# Patient Record
Sex: Female | Born: 1945 | Race: White | Hispanic: No | Smoking: Current some day smoker
Health system: Southern US, Community
[De-identification: ages and names within clinical notes are randomized; demographics above are authoritative.]

## PROBLEM LIST (undated history)

## (undated) DIAGNOSIS — E785 Hyperlipidemia, unspecified: Secondary | ICD-10-CM

## (undated) DIAGNOSIS — F419 Anxiety disorder, unspecified: Secondary | ICD-10-CM

## (undated) DIAGNOSIS — Z79899 Other long term (current) drug therapy: Secondary | ICD-10-CM

## (undated) DIAGNOSIS — I951 Orthostatic hypotension: Secondary | ICD-10-CM

## (undated) DIAGNOSIS — I358 Other nonrheumatic aortic valve disorders: Secondary | ICD-10-CM

## (undated) DIAGNOSIS — J449 Chronic obstructive pulmonary disease, unspecified: Secondary | ICD-10-CM

## (undated) DIAGNOSIS — R1013 Epigastric pain: Secondary | ICD-10-CM

## (undated) DIAGNOSIS — R112 Nausea with vomiting, unspecified: Secondary | ICD-10-CM

## (undated) DIAGNOSIS — F39 Unspecified mood [affective] disorder: Secondary | ICD-10-CM

## (undated) DIAGNOSIS — I10 Essential (primary) hypertension: Secondary | ICD-10-CM

## (undated) DIAGNOSIS — R0602 Shortness of breath: Secondary | ICD-10-CM

## (undated) DIAGNOSIS — C801 Malignant (primary) neoplasm, unspecified: Secondary | ICD-10-CM

## (undated) DIAGNOSIS — I499 Cardiac arrhythmia, unspecified: Secondary | ICD-10-CM

## (undated) DIAGNOSIS — R2681 Unsteadiness on feet: Secondary | ICD-10-CM

## (undated) DIAGNOSIS — M1990 Unspecified osteoarthritis, unspecified site: Secondary | ICD-10-CM

## (undated) DIAGNOSIS — I959 Hypotension, unspecified: Secondary | ICD-10-CM

## (undated) HISTORY — PX: HAND SURGERY: SHX662

## (undated) HISTORY — DX: Nausea with vomiting, unspecified: R11.2

## (undated) HISTORY — PX: APPENDECTOMY: SHX54

## (undated) HISTORY — PX: CHOLECYSTECTOMY: SHX55

## (undated) HISTORY — DX: Other nonrheumatic aortic valve disorders: I35.8

## (undated) HISTORY — PX: REPLACEMENT TOTAL KNEE: SUR1224

## (undated) HISTORY — DX: Other long term (current) drug therapy: Z79.899

## (undated) HISTORY — DX: Hypotension, unspecified: I95.9

## (undated) HISTORY — PX: COLON SURGERY: SHX602

## (undated) HISTORY — PX: BACK SURGERY: SHX140

## (undated) HISTORY — DX: Unspecified mood (affective) disorder: F39

## (undated) HISTORY — DX: Hyperlipidemia, unspecified: E78.5

## (undated) HISTORY — DX: Orthostatic hypotension: I95.1

## (undated) HISTORY — DX: Unsteadiness on feet: R26.81

## (undated) HISTORY — DX: Chronic obstructive pulmonary disease, unspecified: J44.9

## (undated) HISTORY — DX: Epigastric pain: R10.13

## (undated) HISTORY — DX: Essential (primary) hypertension: I10

---

## 2003-01-03 ENCOUNTER — Inpatient Hospital Stay (HOSPITAL_COMMUNITY): Admission: EM | Admit: 2003-01-03 | Discharge: 2003-01-04 | Payer: Self-pay

## 2011-12-07 ENCOUNTER — Other Ambulatory Visit: Payer: Self-pay | Admitting: Gastroenterology

## 2011-12-07 DIAGNOSIS — R197 Diarrhea, unspecified: Secondary | ICD-10-CM

## 2011-12-07 DIAGNOSIS — E164 Increased secretion of gastrin: Secondary | ICD-10-CM

## 2011-12-09 ENCOUNTER — Ambulatory Visit
Admission: RE | Admit: 2011-12-09 | Discharge: 2011-12-09 | Disposition: A | Discharge status: Home / Self Care | Payer: Medicare Other | Source: Ambulatory Visit | Attending: Gastroenterology | Admitting: Gastroenterology

## 2011-12-09 ENCOUNTER — Other Ambulatory Visit: Payer: Self-pay

## 2011-12-09 DIAGNOSIS — R197 Diarrhea, unspecified: Secondary | ICD-10-CM

## 2011-12-09 DIAGNOSIS — E164 Increased secretion of gastrin: Secondary | ICD-10-CM

## 2011-12-10 ENCOUNTER — Ambulatory Visit
Admission: RE | Admit: 2011-12-10 | Discharge: 2011-12-10 | Disposition: A | Discharge status: Home / Self Care | Payer: Medicare Other | Source: Ambulatory Visit | Attending: Gastroenterology | Admitting: Gastroenterology

## 2011-12-10 MED ORDER — GADOBENATE DIMEGLUMINE 529 MG/ML IV SOLN
11.0000 mL | Freq: Once | INTRAVENOUS | Status: AC | PRN
  Administered 2011-12-10: 11 mL via INTRAVENOUS

## 2012-01-12 ENCOUNTER — Inpatient Hospital Stay (HOSPITAL_COMMUNITY)
Admission: RE | Admit: 2012-01-12 | Discharge: 2012-01-12 | DRG: 392 | Disposition: A | Discharge status: Other Acute Inpatient Hospital | Payer: Medicare Other | Source: Ambulatory Visit | Attending: Gastroenterology | Admitting: Gastroenterology

## 2012-01-12 ENCOUNTER — Ambulatory Visit (HOSPITAL_COMMUNITY): Payer: Medicare Other | Admitting: Anesthesiology

## 2012-01-12 ENCOUNTER — Encounter (HOSPITAL_COMMUNITY): Payer: Self-pay

## 2012-01-12 ENCOUNTER — Ambulatory Visit (HOSPITAL_COMMUNITY)
Admission: RE | Disposition: A | Discharge status: Other Acute Inpatient Hospital | Payer: Medicare Other | Source: Ambulatory Visit | Attending: Gastroenterology

## 2012-01-12 ENCOUNTER — Encounter (HOSPITAL_COMMUNITY): Payer: Self-pay | Admitting: Anesthesiology

## 2012-01-12 ENCOUNTER — Ambulatory Visit (HOSPITAL_COMMUNITY): Payer: Medicare Other

## 2012-01-12 DIAGNOSIS — E119 Type 2 diabetes mellitus without complications: Secondary | ICD-10-CM | POA: Diagnosis present

## 2012-01-12 DIAGNOSIS — R5381 Other malaise: Secondary | ICD-10-CM | POA: Diagnosis present

## 2012-01-12 DIAGNOSIS — K529 Noninfective gastroenteritis and colitis, unspecified: Secondary | ICD-10-CM

## 2012-01-12 DIAGNOSIS — R634 Abnormal weight loss: Secondary | ICD-10-CM | POA: Diagnosis present

## 2012-01-12 DIAGNOSIS — Z85038 Personal history of other malignant neoplasm of large intestine: Secondary | ICD-10-CM

## 2012-01-12 DIAGNOSIS — R197 Diarrhea, unspecified: Principal | ICD-10-CM | POA: Diagnosis present

## 2012-01-12 HISTORY — DX: Malignant (primary) neoplasm, unspecified: C80.1

## 2012-01-12 HISTORY — DX: Cardiac arrhythmia, unspecified: I49.9

## 2012-01-12 HISTORY — DX: Noninfective gastroenteritis and colitis, unspecified: K52.9

## 2012-01-12 HISTORY — DX: Shortness of breath: R06.02

## 2012-01-12 HISTORY — DX: Anxiety disorder, unspecified: F41.9

## 2012-01-12 HISTORY — PX: EUS: SHX5427

## 2012-01-12 HISTORY — DX: Unspecified osteoarthritis, unspecified site: M19.90

## 2012-01-12 LAB — CBC
HCT: 35.6 % — ABNORMAL LOW (ref 36.0–46.0)
Hemoglobin: 12.3 g/dL (ref 12.0–15.0)
MCH: 29.9 pg (ref 26.0–34.0)
MCHC: 34.6 g/dL (ref 30.0–36.0)
MCV: 86.6 fL (ref 78.0–100.0)
Platelets: 297 10*3/uL (ref 150–400)
RBC: 4.11 MIL/uL (ref 3.87–5.11)
RDW: 12.8 % (ref 11.5–15.5)
WBC: 10.5 10*3/uL (ref 4.0–10.5)

## 2012-01-12 LAB — GLUCOSE, CAPILLARY
Glucose-Capillary: 80 mg/dL (ref 70–99)
Glucose-Capillary: 95 mg/dL (ref 70–99)
Glucose-Capillary: 100 mg/dL — ABNORMAL HIGH (ref 70–99)
Glucose-Capillary: 101 mg/dL — ABNORMAL HIGH (ref 70–99)

## 2012-01-12 LAB — COMPREHENSIVE METABOLIC PANEL
ALT: 12 U/L (ref 0–35)
AST: 14 U/L (ref 0–37)
Albumin: 3.2 g/dL — ABNORMAL LOW (ref 3.5–5.2)
Alkaline Phosphatase: 57 U/L (ref 39–117)
BUN: 12 mg/dL (ref 6–23)
CO2: 22 mEq/L (ref 19–32)
Calcium: 9.1 mg/dL (ref 8.4–10.5)
Chloride: 110 mEq/L (ref 96–112)
Creatinine, Ser: 0.49 mg/dL — ABNORMAL LOW (ref 0.50–1.10)
GFR calc Af Amer: >90 mL/min (ref >90)
GFR calc non Af Amer: >90 mL/min (ref >90)
Glucose, Bld: 122 mg/dL — ABNORMAL HIGH (ref 70–99)
Potassium: 3.1 mEq/L — ABNORMAL LOW (ref 3.5–5.1)
Sodium: 140 mEq/L (ref 135–145)
Total Bilirubin: 0.4 mg/dL (ref 0.3–1.2)
Total Protein: 5.9 g/dL — ABNORMAL LOW (ref 6.0–8.3)

## 2012-01-12 LAB — DIFFERENTIAL
Basophils Absolute: 0 10*3/uL (ref 0.0–0.1)
Basophils Relative: 0 % (ref 0–1)
Eosinophils Absolute: 0.2 10*3/uL (ref 0.0–0.7)
Eosinophils Relative: 2 % (ref 0–5)
Lymphocytes Relative: 32 % (ref 12–46)
Lymphs Abs: 3.3 10*3/uL (ref 0.7–4.0)
Monocytes Absolute: 0.8 10*3/uL (ref 0.1–1.0)
Monocytes Relative: 7 % (ref 3–12)
Neutro Abs: 6.2 10*3/uL (ref 1.7–7.7)
Neutrophils Relative %: 59 % (ref 43–77)

## 2012-01-12 LAB — TSH: TSH: 2.365 u[IU]/mL (ref 0.350–4.500)

## 2012-01-12 SURGERY — UPPER ENDOSCOPIC ULTRASOUND (EUS) RADIAL
Anesthesia: Monitor Anesthesia Care

## 2012-01-12 MED ORDER — ONDANSETRON HCL 4 MG PO TABS
4.0000 mg | ORAL_TABLET | Freq: Four times a day (QID) | ORAL | Status: DC | PRN
  Filled 2012-01-12: qty 1

## 2012-01-12 MED ORDER — FENTANYL CITRATE 0.05 MG/ML IJ SOLN
INTRAMUSCULAR | Status: DC | PRN
  Administered 2012-01-12: 50 ug via INTRAVENOUS

## 2012-01-12 MED ORDER — FENTANYL CITRATE 0.05 MG/ML IJ SOLN
INTRAMUSCULAR | Status: DC | PRN
  Administered 2012-01-12: 25 ug via INTRAVENOUS

## 2012-01-12 MED ORDER — SODIUM CHLORIDE 0.9 % IV SOLN: INTRAVENOUS | Status: DC

## 2012-01-12 MED ORDER — HYDROMORPHONE HCL PF 1 MG/ML IJ SOLN
1.0000 mg | INTRAMUSCULAR | Status: DC | PRN
  Administered 2012-01-12 (×2): 1 mg via INTRAVENOUS
  Filled 2012-01-12: qty 1

## 2012-01-12 MED ORDER — ZINC OXIDE 40 % EX PSTE
1.0000 "application " | PASTE | CUTANEOUS | Status: DC | PRN
  Filled 2012-01-12: qty 1

## 2012-01-12 MED ORDER — PROPOFOL INFUSION 10 MG/ML OPTIME
INTRAVENOUS | Status: DC | PRN
  Administered 2012-01-12: 140 ug/kg/min via INTRAVENOUS

## 2012-01-12 MED ORDER — FENTANYL CITRATE 0.05 MG/ML IJ SOLN
INTRAMUSCULAR | Status: AC
  Filled 2012-01-12: qty 2

## 2012-01-12 MED ORDER — LACTATED RINGERS IV SOLN: INTRAVENOUS | Status: DC

## 2012-01-12 MED ORDER — ONDANSETRON HCL 4 MG/2ML IJ SOLN
4.0000 mg | Freq: Once | INTRAMUSCULAR | Status: AC
  Administered 2012-01-12: 4 mg via INTRAVENOUS

## 2012-01-12 MED ORDER — ONDANSETRON HCL 4 MG/2ML IJ SOLN
INTRAMUSCULAR | Status: AC
  Filled 2012-01-12: qty 2

## 2012-01-12 MED ORDER — FENTANYL CITRATE 0.05 MG/ML IJ SOLN
25.0000 ug | INTRAMUSCULAR | Status: DC | PRN
  Administered 2012-01-12 (×2): 25 ug via INTRAVENOUS

## 2012-01-12 MED ORDER — MIDAZOLAM HCL 5 MG/5ML IJ SOLN
INTRAMUSCULAR | Status: DC | PRN
  Administered 2012-01-12 (×2): 1 mg via INTRAVENOUS

## 2012-01-12 MED ORDER — HYDROMORPHONE HCL PF 1 MG/ML IJ SOLN
INTRAMUSCULAR | Status: AC
  Filled 2012-01-12: qty 1

## 2012-01-12 MED ORDER — LACTATED RINGERS IV SOLN
INTRAVENOUS | Status: DC | PRN
  Administered 2012-01-12: 08:00:00 via INTRAVENOUS

## 2012-01-12 MED ORDER — TRAMADOL HCL 50 MG PO TABS
50.0000 mg | ORAL_TABLET | Freq: Four times a day (QID) | ORAL | Status: DC | PRN
  Filled 2012-01-12: qty 1

## 2012-01-12 MED ORDER — ZINC OXIDE 40 % EX OINT
TOPICAL_OINTMENT | CUTANEOUS | Status: DC | PRN
  Filled 2012-01-12: qty 114

## 2012-01-12 MED ORDER — ONDANSETRON HCL 4 MG/2ML IJ SOLN: 4.0000 mg | Freq: Four times a day (QID) | INTRAMUSCULAR | Status: DC | PRN

## 2012-01-12 MED ORDER — SODIUM CHLORIDE 0.9 % IV SOLN
INTRAVENOUS | Status: DC
  Administered 2012-01-12: 13:00:00 via INTRAVENOUS

## 2012-01-12 NOTE — Progress Notes (Signed)
Patient transferred to [facility] via [place], report called to [place], nurse at [facility], who denied questions/concerns.  Report given to JC at [facility]. Pt stable at time of discharge.

## 2012-01-12 NOTE — H&P (Signed)
[facility] Admission Note  Chief Complaint:  diarrhea HPI: MARESHA ANASTOS is an 66 y.o. female with intractable diarrhea.  Extensive evaluation with cause of symptoms unclear.  Extensive testing and empiric medication trials unrevealing.  Past Medical History  Diagnosis Date  . Dysrhythmia   . Shortness of breath   . Diabetes mellitus   . Cancer     colon  . Arthritis   . Anxiety     Past Surgical History  Procedure Date  . Cholecystectomy   . Appendectomy   . Replacement total knee   . Colon surgery     Medications Prior to Admission  Medication Sig Dispense Refill  . hyoscyamine (ANASPAZ) 0.125 MG TBDP Place 0.125 mg under the tongue 3 (three) times daily after meals.      Marland Kitchen LORazepam (ATIVAN) 1 MG tablet Take 1 mg by mouth 1 day or 1 dose.      . mesalamine (LIALDA) 1.2 G EC tablet Take 1,200 mg by mouth 4 (four) times daily.      . ondansetron (ZOFRAN) 4 MG tablet Take 4 mg by mouth every 8 (eight) hours as needed.      . potassium chloride SA (K-DUR,KLOR-CON) 20 MEQ tablet Take 20 mEq by mouth 2 (two) times daily.      . temazepam (RESTORIL) 15 MG capsule Take 15 mg by mouth at bedtime as needed.      . traMADol (ULTRAM) 50 MG tablet Take 50 mg by mouth every 6 (six) hours as needed.      . Vitamin D, Ergocalciferol, (DRISDOL) 50000 UNITS CAPS Take 50,000 Units by mouth.      . nitroGLYCERIN (NITROSTAT) 0.4 MG SL tablet Place 0.4 mg under the tongue every 5 (five) minutes as needed.        Allergies:  Allergies  Allergen Reactions  . Codeine Hives  . Tylenol (Acetaminophen)     History reviewed. No pertinent family history.  Social History:  does not have a smoking history on file. She does not have any smokeless tobacco history on file. She reports that she does not drink alcohol or use illicit drugs.   ROS:As per HPI  Blood pressure 141/85, pulse 80, temperature 97.7 F (36.5 C), resp. rate 15, height 5\' 2"  (1.575 m), weight 56.7 kg (125 lb),  SpO2 100.00%. General appearance:  Cachectic and chronically ill-appearing ABD:  Soft, mild generalized tenderness  Results for orders placed during the hospital encounter of 01/12/12 (from the past 48 hour(s))  GLUCOSE, CAPILLARY     Status: Normal   Collection Time   01/12/12  8:04 AM      Component Value Range Comment   Glucose-Capillary 80  70 - 99 mg/dL   GLUCOSE, CAPILLARY     Status: Normal   Collection Time   01/12/12  8:18 AM      Component Value Range Comment   Glucose-Capillary 95  70 - 99 mg/dL    No results found.  Assessment:  1.  Intractable secretory-sounding diarrhea.  Unclear etiology.  Plan:  1.  Admit to [facility] [place] GI service. 2.  Stool studies. 3.  Pain control. 4.  Transfer to tertiary center for further evaluation.  Freddy Jaksch 01/12/2012, 10:09 AM

## 2012-01-12 NOTE — H&P (Signed)
Patient interval history reviewed.  Patient examined again.  There has been no change from documented H/P dated 12/16/11 (scanned into chart from our office) except as documented above.  Assessment:  1.  Intractable diarrhea.  Extensive negative evaluation by two gastroenterologists. 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  MRI abdomen negative.  Plan:  1.  Endoscopic ultrasound, specifically to look for chronic pancreatitis and to assess for neuroendocrine lesion of pancreas. 2.  Risks (bleeding, infection, bowel perforation that could require surgery, sedation-related changes in cardiopulmonary systems), benefits (identification and possible treatment of source of symptoms, exclusion of certain causes of symptoms), and alternatives (watchful waiting, radiographic imaging studies, empiric medical treatment) of upper endoscopy with ultrasound (EUS) were explained to patient in detail and patient wishes to proceed. 3.  Patient will be admitted post-procedure, to facilitate stool analysis (fecal electrolytes, laxative screen, trial of sandostatin), and to better quantify volume of diarrhea.  I suspect patient will ultimate require evaluation at tertiary center.

## 2012-01-12 NOTE — Preoperative (Signed)
Beta Blockers   Reason not to administer Beta Blockers:Not Applicable 

## 2012-01-12 NOTE — Op Note (Signed)
[facility] [address] [place], [address]   ENDOSCOPIC ULTRASOUND PROCEDURE REPORT  PATIENT: Heather Mccoy, Heather Mccoy  MR#: [id number] BIRTHDATE: 10-23-1945  GENDER: Female ENDOSCOPIST: Willis Modena, MD REFERRED BY:  Webb Silversmith, MD PROCEDURE DATE:  01/12/2012 PROCEDURE:   Upper endoscopy with biopsies; Endoscopic Ultrasound ASA CLASS:      ASA III INDICATIONS:   Abdominal pain, diarrhea, weight loss, elevated gastrin level MEDICATIONS: Cetacaine spray x 2; MAC anesthesia  DESCRIPTION OF PROCEDURE:   After the risks benefits and alternatives of the procedure were  explained, informed consent was obtained. The patient was then placed in the left, lateral, decubitus postion and IV sedation was administered. Throughout the procedure, the patients blood pressure, pulse and oxygen saturations were monitored continuously.  Under direct visualization, the Pentax Radial EUS [id number]  endoscope was introduced through the mouth  and advanced to the second portion of the duodenum      .  Water was used as necessary to provide an acoustic interface.  Upon completion of the imaging, water was removed and the patient was sent to the recovery room in satisfactory condition.     FINDINGS:      Upper endoscopy done; normal to the second portion of the duodenum; random small bowel biopsies were done.  Endoscopic ultrasound subsequently done with radial echoendoscope.  Pancreatic parenchyma diffusely fatty with scattered hyperechoic strands/foci; no evidence of pancreatic mass or chronic pancreatitis.  No obvious lesion in left lobe of liver.  Post-cholecystectomy.  No evidence of bile duct wall thickening or choledocholithiasis.  Ampulla normal via endoscopic ultrasound.  IMPRESSION:     As above.  No source of patient's symptoms were identified.  RECOMMENDATIONS:     1.  Watch for potential complications of procedure. 2.  Will admit patient for ongoing management. 3.   Repeat stool studies; trial of sandostatin; pain control. 4.  Will seek transfer to tertiary center for further evaluation.    _______________________________ Rosalie DoctorWillis Modena, MD 01/12/2012 9:38 AM   CC:

## 2012-01-12 NOTE — Transfer of Care (Signed)
Immediate Anesthesia Transfer of Care Note  Patient: Heather Mccoy  Procedure(s) Performed: Procedure(s) (LRB): UPPER ENDOSCOPIC ULTRASOUND (EUS) RADIAL (N/A)  Patient Location: PACU and Endoscopy Unit  Anesthesia Type: MAC  Level of Consciousness: awake, alert  and patient cooperative  Airway & Oxygen Therapy: Patient Spontanous Breathing and Patient connected to nasal cannula oxygen  Post-op Assessment: Report given to PACU RN and Post -op Vital signs reviewed and stable  Post vital signs: Reviewed and stable  Complications: No apparent anesthesia complications

## 2012-01-12 NOTE — Anesthesia Preprocedure Evaluation (Addendum)
Anesthesia Evaluation  Patient identified by MRN, date of birth, ID band Patient awake    Reviewed: Allergy & Precautions, H&P , NPO status , Patient's Chart, lab work & pertinent test results  History of Anesthesia Complications (+) DIFFICULT AIRWAY  Airway Mallampati: II TM Distance: >3 FB Neck ROM: full    Dental  (+) Dental Advisory Given, Edentulous Upper and Edentulous Lower   Pulmonary neg pulmonary ROS, shortness of breath and with exertion,  breath sounds clear to auscultation  Pulmonary exam normal       Cardiovascular Exercise Tolerance: Good negative cardio ROS  + dysrhythmias Rhythm:regular Rate:Normal     Neuro/Psych negative neurological ROS  negative psych ROS   GI/Hepatic negative GI ROS, Neg liver ROS,   Endo/Other  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 Controlled, Type 2  Renal/GU negative Renal ROS  negative genitourinary   Musculoskeletal   Abdominal   Peds  Hematology negative hematology ROS (+)   Anesthesia Other Findings   Reproductive/Obstetrics negative OB ROS                          Anesthesia Physical Anesthesia Plan  ASA: III  Anesthesia Plan: MAC   Post-op Pain Management:    Induction:   Airway Management Planned:   Additional Equipment:   Intra-op Plan:   Post-operative Plan:   Informed Consent: I have reviewed the patients History and Physical, chart, labs and discussed the procedure including the risks, benefits and alternatives for the proposed anesthesia with the patient or authorized representative who has indicated his/her understanding and acceptance.   Dental Advisory Given  Plan Discussed with: CRNA and Surgeon  Anesthesia Plan Comments:        Anesthesia Quick Evaluation

## 2012-01-12 NOTE — Anesthesia Postprocedure Evaluation (Signed)
  Anesthesia Post-op Note  Patient: Heather Mccoy  Procedure(s) Performed: Procedure(s) (LRB): UPPER ENDOSCOPIC ULTRASOUND (EUS) RADIAL (N/A)  Patient Location: PACU  Anesthesia Type: MAC  Level of Consciousness: awake and alert   Airway and Oxygen Therapy: Patient Spontanous Breathing  Post-op Pain: mild  Post-op Assessment: Post-op Vital signs reviewed, Patient's Cardiovascular Status Stable, Respiratory Function Stable, Patent Airway and No signs of Nausea or vomiting  Post-op Vital Signs: stable  Complications: No apparent anesthesia complications

## 2012-01-13 ENCOUNTER — Encounter (HOSPITAL_COMMUNITY): Payer: Self-pay | Admitting: Gastroenterology

## 2012-01-13 LAB — CORTISOL-PM, BLOOD: Cortisol - PM: 4.8 ug/dL (ref 3.1–16.7)

## 2012-01-14 NOTE — Discharge Summary (Signed)
[facility] Discharge Summary   Heather Mccoy MRN: [id number]  Admit date: 01/12/2012 Discharge date: 01/12/2012  Admission Diagnoses: diarrhea  Discharge Diagnoses: diarrhea Active Problems:  * No active hospital problems. *   History of Present Illness: 66 yo female admitted for management of intractable diarrhea.  Extensive negative evaluation.  Patient is becoming progressively debilitated and has lost ~ 50 lbs.  Hospital Course: Was admitted for medical management (pain control, assessment/quantification of diarrhea). It was felt that tertiary center evaluation, in light of two community gastroenterologists' evaluation without explanation of symptoms, was warranted.  She was graciously accepted and transferred to [facility] for further evaluation.  Discharged Condition: Fair  Disposition: [facility]-Another Health Care Institution Not Defined   Medication List  As of 01/14/2012  9:38 AM   ASK your doctor about these medications         hyoscyamine 0.125 MG Tbdp   Commonly known as: ANASPAZ   Place 0.125 mg under the tongue 3 (three) times daily after meals.      LORazepam 1 MG tablet   Commonly known as: ATIVAN   Take 1 mg by mouth 1 day or 1 dose.      mesalamine 1.2 G EC tablet   Commonly known as: LIALDA   Take 1,200 mg by mouth 4 (four) times daily.      nitroGLYCERIN 0.4 MG SL tablet   Commonly known as: NITROSTAT   Place 0.4 mg under the tongue every 5 (five) minutes as needed.      ondansetron 4 MG tablet   Commonly known as: ZOFRAN   Take 4 mg by mouth every 8 (eight) hours as needed.      potassium chloride SA 20 MEQ tablet   Commonly known as: K-DUR,KLOR-CON   Take 20 mEq by mouth 2 (two) times daily.      temazepam 15 MG capsule   Commonly known as: RESTORIL   Take 15 mg by mouth at bedtime as needed.      traMADol 50 MG tablet   Commonly known as: ULTRAM   Take 50 mg by mouth every 6 (six) hours as needed.     Vitamin D (Ergocalciferol) 50000 UNITS Caps   Commonly known as: DRISDOL   Take 50,000 Units by mouth.             SignedFreddy Jaksch 01/14/2012, 9:38 AM

## 2014-05-30 DIAGNOSIS — R1033 Periumbilical pain: Secondary | ICD-10-CM | POA: Diagnosis not present

## 2014-05-30 DIAGNOSIS — K589 Irritable bowel syndrome without diarrhea: Secondary | ICD-10-CM | POA: Diagnosis not present

## 2014-06-01 DIAGNOSIS — I517 Cardiomegaly: Secondary | ICD-10-CM | POA: Diagnosis not present

## 2014-06-01 DIAGNOSIS — F17210 Nicotine dependence, cigarettes, uncomplicated: Secondary | ICD-10-CM | POA: Diagnosis not present

## 2014-06-01 DIAGNOSIS — R0789 Other chest pain: Secondary | ICD-10-CM | POA: Diagnosis not present

## 2014-06-01 DIAGNOSIS — J948 Other specified pleural conditions: Secondary | ICD-10-CM | POA: Diagnosis not present

## 2014-06-01 DIAGNOSIS — J984 Other disorders of lung: Secondary | ICD-10-CM | POA: Diagnosis not present

## 2014-06-01 DIAGNOSIS — J441 Chronic obstructive pulmonary disease with (acute) exacerbation: Secondary | ICD-10-CM | POA: Diagnosis not present

## 2014-06-01 DIAGNOSIS — R079 Chest pain, unspecified: Secondary | ICD-10-CM | POA: Diagnosis not present

## 2014-06-02 DIAGNOSIS — R0602 Shortness of breath: Secondary | ICD-10-CM | POA: Diagnosis not present

## 2014-06-02 DIAGNOSIS — R531 Weakness: Secondary | ICD-10-CM | POA: Diagnosis not present

## 2014-06-02 DIAGNOSIS — F17210 Nicotine dependence, cigarettes, uncomplicated: Secondary | ICD-10-CM | POA: Diagnosis not present

## 2014-06-02 DIAGNOSIS — R079 Chest pain, unspecified: Secondary | ICD-10-CM | POA: Diagnosis not present

## 2014-06-02 DIAGNOSIS — I10 Essential (primary) hypertension: Secondary | ICD-10-CM | POA: Diagnosis not present

## 2014-06-02 DIAGNOSIS — R404 Transient alteration of awareness: Secondary | ICD-10-CM | POA: Diagnosis not present

## 2014-06-04 DIAGNOSIS — R1033 Periumbilical pain: Secondary | ICD-10-CM | POA: Diagnosis not present

## 2014-06-04 DIAGNOSIS — R1031 Right lower quadrant pain: Secondary | ICD-10-CM | POA: Diagnosis not present

## 2014-06-04 DIAGNOSIS — Z85038 Personal history of other malignant neoplasm of large intestine: Secondary | ICD-10-CM | POA: Diagnosis not present

## 2014-06-06 DIAGNOSIS — R112 Nausea with vomiting, unspecified: Secondary | ICD-10-CM | POA: Diagnosis not present

## 2014-06-06 DIAGNOSIS — I517 Cardiomegaly: Secondary | ICD-10-CM | POA: Diagnosis not present

## 2014-06-06 DIAGNOSIS — I10 Essential (primary) hypertension: Secondary | ICD-10-CM | POA: Diagnosis not present

## 2014-06-06 DIAGNOSIS — R072 Precordial pain: Secondary | ICD-10-CM | POA: Diagnosis not present

## 2014-06-06 DIAGNOSIS — R079 Chest pain, unspecified: Secondary | ICD-10-CM | POA: Diagnosis not present

## 2014-06-08 DIAGNOSIS — I10 Essential (primary) hypertension: Secondary | ICD-10-CM | POA: Diagnosis not present

## 2014-06-08 DIAGNOSIS — R112 Nausea with vomiting, unspecified: Secondary | ICD-10-CM | POA: Diagnosis not present

## 2014-06-08 DIAGNOSIS — R1013 Epigastric pain: Secondary | ICD-10-CM | POA: Diagnosis not present

## 2014-06-08 DIAGNOSIS — E780 Pure hypercholesterolemia: Secondary | ICD-10-CM | POA: Diagnosis not present

## 2014-06-08 DIAGNOSIS — J9811 Atelectasis: Secondary | ICD-10-CM | POA: Diagnosis not present

## 2014-06-08 DIAGNOSIS — F17210 Nicotine dependence, cigarettes, uncomplicated: Secondary | ICD-10-CM | POA: Diagnosis not present

## 2014-06-08 DIAGNOSIS — E118 Type 2 diabetes mellitus with unspecified complications: Secondary | ICD-10-CM | POA: Diagnosis not present

## 2014-06-08 DIAGNOSIS — I517 Cardiomegaly: Secondary | ICD-10-CM | POA: Diagnosis not present

## 2014-06-08 DIAGNOSIS — R079 Chest pain, unspecified: Secondary | ICD-10-CM | POA: Diagnosis not present

## 2014-06-08 DIAGNOSIS — K2970 Gastritis, unspecified, without bleeding: Secondary | ICD-10-CM | POA: Diagnosis not present

## 2014-06-08 DIAGNOSIS — R1011 Right upper quadrant pain: Secondary | ICD-10-CM | POA: Diagnosis not present

## 2014-06-10 DIAGNOSIS — E1140 Type 2 diabetes mellitus with diabetic neuropathy, unspecified: Secondary | ICD-10-CM | POA: Diagnosis not present

## 2014-06-10 DIAGNOSIS — N281 Cyst of kidney, acquired: Secondary | ICD-10-CM | POA: Diagnosis not present

## 2014-06-10 DIAGNOSIS — E780 Pure hypercholesterolemia: Secondary | ICD-10-CM | POA: Diagnosis not present

## 2014-06-10 DIAGNOSIS — R112 Nausea with vomiting, unspecified: Secondary | ICD-10-CM | POA: Diagnosis not present

## 2014-06-10 DIAGNOSIS — J811 Chronic pulmonary edema: Secondary | ICD-10-CM | POA: Diagnosis not present

## 2014-06-10 DIAGNOSIS — R9431 Abnormal electrocardiogram [ECG] [EKG]: Secondary | ICD-10-CM | POA: Diagnosis not present

## 2014-06-10 DIAGNOSIS — I10 Essential (primary) hypertension: Secondary | ICD-10-CM | POA: Diagnosis not present

## 2014-06-10 DIAGNOSIS — J9811 Atelectasis: Secondary | ICD-10-CM | POA: Diagnosis not present

## 2014-06-10 DIAGNOSIS — R109 Unspecified abdominal pain: Secondary | ICD-10-CM | POA: Diagnosis not present

## 2014-06-10 DIAGNOSIS — R1030 Lower abdominal pain, unspecified: Secondary | ICD-10-CM | POA: Diagnosis not present

## 2014-06-10 DIAGNOSIS — F17210 Nicotine dependence, cigarettes, uncomplicated: Secondary | ICD-10-CM | POA: Diagnosis not present

## 2014-06-11 DIAGNOSIS — R1084 Generalized abdominal pain: Secondary | ICD-10-CM | POA: Diagnosis not present

## 2014-06-11 DIAGNOSIS — R109 Unspecified abdominal pain: Secondary | ICD-10-CM | POA: Diagnosis not present

## 2014-06-13 DIAGNOSIS — K589 Irritable bowel syndrome without diarrhea: Secondary | ICD-10-CM | POA: Diagnosis not present

## 2014-06-13 DIAGNOSIS — R112 Nausea with vomiting, unspecified: Secondary | ICD-10-CM | POA: Diagnosis not present

## 2014-06-13 DIAGNOSIS — R1033 Periumbilical pain: Secondary | ICD-10-CM | POA: Diagnosis not present

## 2014-06-14 DIAGNOSIS — E780 Pure hypercholesterolemia: Secondary | ICD-10-CM | POA: Diagnosis not present

## 2014-06-14 DIAGNOSIS — E119 Type 2 diabetes mellitus without complications: Secondary | ICD-10-CM | POA: Diagnosis not present

## 2014-06-14 DIAGNOSIS — R069 Unspecified abnormalities of breathing: Secondary | ICD-10-CM | POA: Diagnosis not present

## 2014-06-14 DIAGNOSIS — I10 Essential (primary) hypertension: Secondary | ICD-10-CM | POA: Diagnosis not present

## 2014-06-14 DIAGNOSIS — F419 Anxiety disorder, unspecified: Secondary | ICD-10-CM | POA: Diagnosis not present

## 2014-06-19 DIAGNOSIS — R112 Nausea with vomiting, unspecified: Secondary | ICD-10-CM | POA: Diagnosis not present

## 2014-06-19 DIAGNOSIS — K219 Gastro-esophageal reflux disease without esophagitis: Secondary | ICD-10-CM | POA: Diagnosis not present

## 2014-07-01 DIAGNOSIS — R1084 Generalized abdominal pain: Secondary | ICD-10-CM | POA: Diagnosis not present

## 2014-07-01 DIAGNOSIS — J449 Chronic obstructive pulmonary disease, unspecified: Secondary | ICD-10-CM | POA: Diagnosis not present

## 2014-07-01 DIAGNOSIS — I10 Essential (primary) hypertension: Secondary | ICD-10-CM | POA: Diagnosis not present

## 2014-07-01 DIAGNOSIS — I4891 Unspecified atrial fibrillation: Secondary | ICD-10-CM | POA: Diagnosis not present

## 2014-07-01 DIAGNOSIS — R112 Nausea with vomiting, unspecified: Secondary | ICD-10-CM | POA: Diagnosis not present

## 2014-07-01 DIAGNOSIS — R109 Unspecified abdominal pain: Secondary | ICD-10-CM | POA: Diagnosis not present

## 2014-07-01 DIAGNOSIS — I509 Heart failure, unspecified: Secondary | ICD-10-CM | POA: Diagnosis not present

## 2014-07-01 DIAGNOSIS — E780 Pure hypercholesterolemia: Secondary | ICD-10-CM | POA: Diagnosis not present

## 2014-07-01 DIAGNOSIS — K2970 Gastritis, unspecified, without bleeding: Secondary | ICD-10-CM | POA: Diagnosis not present

## 2014-07-01 DIAGNOSIS — M1990 Unspecified osteoarthritis, unspecified site: Secondary | ICD-10-CM | POA: Diagnosis not present

## 2014-07-03 DIAGNOSIS — R109 Unspecified abdominal pain: Secondary | ICD-10-CM | POA: Diagnosis not present

## 2014-07-03 DIAGNOSIS — R110 Nausea: Secondary | ICD-10-CM | POA: Diagnosis not present

## 2014-07-03 DIAGNOSIS — K529 Noninfective gastroenteritis and colitis, unspecified: Secondary | ICD-10-CM | POA: Diagnosis not present

## 2014-07-03 DIAGNOSIS — F419 Anxiety disorder, unspecified: Secondary | ICD-10-CM | POA: Diagnosis not present

## 2014-07-03 DIAGNOSIS — F17200 Nicotine dependence, unspecified, uncomplicated: Secondary | ICD-10-CM | POA: Diagnosis not present

## 2014-07-10 DIAGNOSIS — R112 Nausea with vomiting, unspecified: Secondary | ICD-10-CM | POA: Diagnosis not present

## 2014-07-10 DIAGNOSIS — K580 Irritable bowel syndrome with diarrhea: Secondary | ICD-10-CM | POA: Diagnosis not present

## 2014-07-10 DIAGNOSIS — R195 Other fecal abnormalities: Secondary | ICD-10-CM | POA: Diagnosis not present

## 2014-07-11 DIAGNOSIS — R1030 Lower abdominal pain, unspecified: Secondary | ICD-10-CM | POA: Diagnosis not present

## 2014-07-11 DIAGNOSIS — R109 Unspecified abdominal pain: Secondary | ICD-10-CM | POA: Diagnosis not present

## 2014-07-11 DIAGNOSIS — K2950 Unspecified chronic gastritis without bleeding: Secondary | ICD-10-CM | POA: Diagnosis not present

## 2014-07-11 DIAGNOSIS — R112 Nausea with vomiting, unspecified: Secondary | ICD-10-CM | POA: Diagnosis not present

## 2014-07-31 DIAGNOSIS — K2970 Gastritis, unspecified, without bleeding: Secondary | ICD-10-CM | POA: Diagnosis not present

## 2014-07-31 DIAGNOSIS — K529 Noninfective gastroenteritis and colitis, unspecified: Secondary | ICD-10-CM | POA: Diagnosis not present

## 2014-07-31 DIAGNOSIS — Z6828 Body mass index (BMI) 28.0-28.9, adult: Secondary | ICD-10-CM | POA: Diagnosis not present

## 2014-07-31 DIAGNOSIS — R112 Nausea with vomiting, unspecified: Secondary | ICD-10-CM | POA: Diagnosis not present

## 2014-07-31 DIAGNOSIS — R1111 Vomiting without nausea: Secondary | ICD-10-CM | POA: Diagnosis not present

## 2014-07-31 DIAGNOSIS — K219 Gastro-esophageal reflux disease without esophagitis: Secondary | ICD-10-CM | POA: Diagnosis not present

## 2014-07-31 DIAGNOSIS — T148 Other injury of unspecified body region: Secondary | ICD-10-CM | POA: Diagnosis not present

## 2014-07-31 DIAGNOSIS — S59901A Unspecified injury of right elbow, initial encounter: Secondary | ICD-10-CM | POA: Diagnosis not present

## 2014-07-31 DIAGNOSIS — S50311A Abrasion of right elbow, initial encounter: Secondary | ICD-10-CM | POA: Diagnosis not present

## 2014-07-31 DIAGNOSIS — Z720 Tobacco use: Secondary | ICD-10-CM | POA: Diagnosis not present

## 2014-07-31 DIAGNOSIS — R109 Unspecified abdominal pain: Secondary | ICD-10-CM | POA: Diagnosis not present

## 2014-08-22 DIAGNOSIS — G43909 Migraine, unspecified, not intractable, without status migrainosus: Secondary | ICD-10-CM | POA: Diagnosis not present

## 2014-08-22 DIAGNOSIS — F17210 Nicotine dependence, cigarettes, uncomplicated: Secondary | ICD-10-CM | POA: Diagnosis not present

## 2014-08-22 DIAGNOSIS — E780 Pure hypercholesterolemia: Secondary | ICD-10-CM | POA: Diagnosis not present

## 2014-08-22 DIAGNOSIS — R109 Unspecified abdominal pain: Secondary | ICD-10-CM | POA: Diagnosis not present

## 2014-08-22 DIAGNOSIS — I10 Essential (primary) hypertension: Secondary | ICD-10-CM | POA: Diagnosis not present

## 2014-08-22 DIAGNOSIS — R112 Nausea with vomiting, unspecified: Secondary | ICD-10-CM | POA: Diagnosis not present

## 2014-08-22 DIAGNOSIS — E119 Type 2 diabetes mellitus without complications: Secondary | ICD-10-CM | POA: Diagnosis not present

## 2014-08-22 DIAGNOSIS — J449 Chronic obstructive pulmonary disease, unspecified: Secondary | ICD-10-CM | POA: Diagnosis not present

## 2014-09-07 DIAGNOSIS — J9811 Atelectasis: Secondary | ICD-10-CM | POA: Diagnosis not present

## 2014-09-07 DIAGNOSIS — R1084 Generalized abdominal pain: Secondary | ICD-10-CM | POA: Diagnosis not present

## 2014-09-07 DIAGNOSIS — R109 Unspecified abdominal pain: Secondary | ICD-10-CM | POA: Diagnosis not present

## 2014-09-07 DIAGNOSIS — R112 Nausea with vomiting, unspecified: Secondary | ICD-10-CM | POA: Diagnosis not present

## 2014-09-07 DIAGNOSIS — I517 Cardiomegaly: Secondary | ICD-10-CM | POA: Diagnosis not present

## 2014-09-12 DIAGNOSIS — Z87891 Personal history of nicotine dependence: Secondary | ICD-10-CM | POA: Diagnosis not present

## 2014-09-12 DIAGNOSIS — E43 Unspecified severe protein-calorie malnutrition: Secondary | ICD-10-CM | POA: Diagnosis not present

## 2014-09-12 DIAGNOSIS — Z8249 Family history of ischemic heart disease and other diseases of the circulatory system: Secondary | ICD-10-CM | POA: Diagnosis not present

## 2014-09-12 DIAGNOSIS — R531 Weakness: Secondary | ICD-10-CM | POA: Diagnosis not present

## 2014-09-12 DIAGNOSIS — M6281 Muscle weakness (generalized): Secondary | ICD-10-CM | POA: Diagnosis not present

## 2014-09-12 DIAGNOSIS — T8351XA Infection and inflammatory reaction due to indwelling urinary catheter, initial encounter: Secondary | ICD-10-CM | POA: Diagnosis not present

## 2014-09-12 DIAGNOSIS — R5383 Other fatigue: Secondary | ICD-10-CM | POA: Diagnosis not present

## 2014-09-12 DIAGNOSIS — N289 Disorder of kidney and ureter, unspecified: Secondary | ICD-10-CM | POA: Diagnosis not present

## 2014-09-12 DIAGNOSIS — Z85038 Personal history of other malignant neoplasm of large intestine: Secondary | ICD-10-CM | POA: Diagnosis not present

## 2014-09-12 DIAGNOSIS — K219 Gastro-esophageal reflux disease without esophagitis: Secondary | ICD-10-CM | POA: Diagnosis not present

## 2014-09-12 DIAGNOSIS — E861 Hypovolemia: Secondary | ICD-10-CM | POA: Diagnosis not present

## 2014-09-12 DIAGNOSIS — D649 Anemia, unspecified: Secondary | ICD-10-CM | POA: Insufficient documentation

## 2014-09-12 DIAGNOSIS — J449 Chronic obstructive pulmonary disease, unspecified: Secondary | ICD-10-CM | POA: Diagnosis not present

## 2014-09-12 DIAGNOSIS — R55 Syncope and collapse: Secondary | ICD-10-CM | POA: Diagnosis not present

## 2014-09-12 DIAGNOSIS — R300 Dysuria: Secondary | ICD-10-CM | POA: Diagnosis not present

## 2014-09-12 DIAGNOSIS — R634 Abnormal weight loss: Secondary | ICD-10-CM

## 2014-09-12 DIAGNOSIS — M8580 Other specified disorders of bone density and structure, unspecified site: Secondary | ICD-10-CM | POA: Diagnosis not present

## 2014-09-12 DIAGNOSIS — R262 Difficulty in walking, not elsewhere classified: Secondary | ICD-10-CM | POA: Diagnosis not present

## 2014-09-12 DIAGNOSIS — B961 Klebsiella pneumoniae [K. pneumoniae] as the cause of diseases classified elsewhere: Secondary | ICD-10-CM | POA: Diagnosis not present

## 2014-09-12 DIAGNOSIS — N390 Urinary tract infection, site not specified: Secondary | ICD-10-CM | POA: Diagnosis not present

## 2014-09-12 DIAGNOSIS — Z96651 Presence of right artificial knee joint: Secondary | ICD-10-CM | POA: Diagnosis not present

## 2014-09-12 DIAGNOSIS — R404 Transient alteration of awareness: Secondary | ICD-10-CM | POA: Diagnosis not present

## 2014-09-12 DIAGNOSIS — Z7401 Bed confinement status: Secondary | ICD-10-CM | POA: Diagnosis not present

## 2014-09-12 DIAGNOSIS — I2510 Atherosclerotic heart disease of native coronary artery without angina pectoris: Secondary | ICD-10-CM | POA: Diagnosis not present

## 2014-09-12 DIAGNOSIS — D519 Vitamin B12 deficiency anemia, unspecified: Secondary | ICD-10-CM | POA: Diagnosis not present

## 2014-09-12 DIAGNOSIS — R350 Frequency of micturition: Secondary | ICD-10-CM | POA: Diagnosis not present

## 2014-09-12 DIAGNOSIS — R0782 Intercostal pain: Secondary | ICD-10-CM | POA: Diagnosis not present

## 2014-09-12 DIAGNOSIS — E872 Acidosis: Secondary | ICD-10-CM | POA: Diagnosis not present

## 2014-09-12 DIAGNOSIS — K760 Fatty (change of) liver, not elsewhere classified: Secondary | ICD-10-CM | POA: Diagnosis not present

## 2014-09-12 DIAGNOSIS — R1084 Generalized abdominal pain: Secondary | ICD-10-CM | POA: Diagnosis not present

## 2014-09-12 DIAGNOSIS — R109 Unspecified abdominal pain: Secondary | ICD-10-CM | POA: Diagnosis not present

## 2014-09-12 DIAGNOSIS — I493 Ventricular premature depolarization: Secondary | ICD-10-CM | POA: Diagnosis not present

## 2014-09-12 DIAGNOSIS — N2889 Other specified disorders of kidney and ureter: Secondary | ICD-10-CM | POA: Diagnosis not present

## 2014-09-12 DIAGNOSIS — R112 Nausea with vomiting, unspecified: Secondary | ICD-10-CM | POA: Diagnosis not present

## 2014-09-12 DIAGNOSIS — Z823 Family history of stroke: Secondary | ICD-10-CM | POA: Diagnosis not present

## 2014-09-12 DIAGNOSIS — Z86010 Personal history of colonic polyps: Secondary | ICD-10-CM | POA: Diagnosis not present

## 2014-09-12 DIAGNOSIS — C801 Malignant (primary) neoplasm, unspecified: Secondary | ICD-10-CM | POA: Diagnosis not present

## 2014-09-12 DIAGNOSIS — Z833 Family history of diabetes mellitus: Secondary | ICD-10-CM | POA: Diagnosis not present

## 2014-09-12 DIAGNOSIS — E860 Dehydration: Secondary | ICD-10-CM | POA: Diagnosis not present

## 2014-09-12 DIAGNOSIS — E119 Type 2 diabetes mellitus without complications: Secondary | ICD-10-CM | POA: Diagnosis not present

## 2014-09-12 HISTORY — DX: Abnormal weight loss: R63.4

## 2014-09-12 HISTORY — DX: Anemia, unspecified: D64.9

## 2014-09-13 DIAGNOSIS — E538 Deficiency of other specified B group vitamins: Secondary | ICD-10-CM | POA: Insufficient documentation

## 2014-09-13 HISTORY — DX: Deficiency of other specified B group vitamins: E53.8

## 2014-09-18 DIAGNOSIS — D649 Anemia, unspecified: Secondary | ICD-10-CM | POA: Diagnosis not present

## 2014-09-18 DIAGNOSIS — N390 Urinary tract infection, site not specified: Secondary | ICD-10-CM | POA: Diagnosis not present

## 2014-09-18 DIAGNOSIS — R55 Syncope and collapse: Secondary | ICD-10-CM | POA: Diagnosis not present

## 2014-09-18 DIAGNOSIS — R262 Difficulty in walking, not elsewhere classified: Secondary | ICD-10-CM | POA: Diagnosis not present

## 2014-09-18 DIAGNOSIS — R1031 Right lower quadrant pain: Secondary | ICD-10-CM | POA: Diagnosis not present

## 2014-09-18 DIAGNOSIS — E119 Type 2 diabetes mellitus without complications: Secondary | ICD-10-CM | POA: Diagnosis not present

## 2014-09-18 DIAGNOSIS — Z7401 Bed confinement status: Secondary | ICD-10-CM | POA: Diagnosis not present

## 2014-09-18 DIAGNOSIS — R531 Weakness: Secondary | ICD-10-CM | POA: Diagnosis not present

## 2014-09-18 DIAGNOSIS — M6281 Muscle weakness (generalized): Secondary | ICD-10-CM | POA: Diagnosis not present

## 2014-09-18 DIAGNOSIS — R197 Diarrhea, unspecified: Secondary | ICD-10-CM | POA: Diagnosis not present

## 2014-09-18 DIAGNOSIS — C801 Malignant (primary) neoplasm, unspecified: Secondary | ICD-10-CM | POA: Diagnosis not present

## 2014-09-18 DIAGNOSIS — J449 Chronic obstructive pulmonary disease, unspecified: Secondary | ICD-10-CM | POA: Diagnosis not present

## 2014-09-18 DIAGNOSIS — N2889 Other specified disorders of kidney and ureter: Secondary | ICD-10-CM | POA: Diagnosis not present

## 2014-09-18 DIAGNOSIS — R5383 Other fatigue: Secondary | ICD-10-CM | POA: Diagnosis not present

## 2014-09-18 DIAGNOSIS — R112 Nausea with vomiting, unspecified: Secondary | ICD-10-CM | POA: Diagnosis not present

## 2014-09-19 DIAGNOSIS — E119 Type 2 diabetes mellitus without complications: Secondary | ICD-10-CM | POA: Diagnosis not present

## 2014-09-19 DIAGNOSIS — R1031 Right lower quadrant pain: Secondary | ICD-10-CM | POA: Diagnosis not present

## 2014-09-19 DIAGNOSIS — R112 Nausea with vomiting, unspecified: Secondary | ICD-10-CM | POA: Diagnosis not present

## 2014-09-19 DIAGNOSIS — J449 Chronic obstructive pulmonary disease, unspecified: Secondary | ICD-10-CM | POA: Diagnosis not present

## 2014-09-24 DIAGNOSIS — R112 Nausea with vomiting, unspecified: Secondary | ICD-10-CM | POA: Diagnosis not present

## 2014-09-24 DIAGNOSIS — R1031 Right lower quadrant pain: Secondary | ICD-10-CM | POA: Diagnosis not present

## 2014-09-24 DIAGNOSIS — R197 Diarrhea, unspecified: Secondary | ICD-10-CM | POA: Diagnosis not present

## 2014-09-24 DIAGNOSIS — J449 Chronic obstructive pulmonary disease, unspecified: Secondary | ICD-10-CM | POA: Diagnosis not present

## 2014-09-28 DIAGNOSIS — J449 Chronic obstructive pulmonary disease, unspecified: Secondary | ICD-10-CM | POA: Diagnosis not present

## 2014-09-28 DIAGNOSIS — E119 Type 2 diabetes mellitus without complications: Secondary | ICD-10-CM | POA: Diagnosis not present

## 2014-09-28 DIAGNOSIS — K529 Noninfective gastroenteritis and colitis, unspecified: Secondary | ICD-10-CM | POA: Diagnosis not present

## 2014-09-28 DIAGNOSIS — R279 Unspecified lack of coordination: Secondary | ICD-10-CM | POA: Diagnosis not present

## 2014-09-30 DIAGNOSIS — R279 Unspecified lack of coordination: Secondary | ICD-10-CM | POA: Diagnosis not present

## 2014-09-30 DIAGNOSIS — J449 Chronic obstructive pulmonary disease, unspecified: Secondary | ICD-10-CM | POA: Diagnosis not present

## 2014-09-30 DIAGNOSIS — E119 Type 2 diabetes mellitus without complications: Secondary | ICD-10-CM | POA: Diagnosis not present

## 2014-09-30 DIAGNOSIS — K529 Noninfective gastroenteritis and colitis, unspecified: Secondary | ICD-10-CM | POA: Diagnosis not present

## 2014-10-01 DIAGNOSIS — E119 Type 2 diabetes mellitus without complications: Secondary | ICD-10-CM | POA: Diagnosis not present

## 2014-10-01 DIAGNOSIS — E785 Hyperlipidemia, unspecified: Secondary | ICD-10-CM | POA: Diagnosis not present

## 2014-10-01 DIAGNOSIS — I10 Essential (primary) hypertension: Secondary | ICD-10-CM | POA: Diagnosis not present

## 2014-10-01 DIAGNOSIS — J449 Chronic obstructive pulmonary disease, unspecified: Secondary | ICD-10-CM | POA: Diagnosis not present

## 2014-10-01 DIAGNOSIS — R279 Unspecified lack of coordination: Secondary | ICD-10-CM | POA: Diagnosis not present

## 2014-10-01 DIAGNOSIS — E559 Vitamin D deficiency, unspecified: Secondary | ICD-10-CM | POA: Diagnosis not present

## 2014-10-01 DIAGNOSIS — R112 Nausea with vomiting, unspecified: Secondary | ICD-10-CM | POA: Diagnosis not present

## 2014-10-01 DIAGNOSIS — K529 Noninfective gastroenteritis and colitis, unspecified: Secondary | ICD-10-CM | POA: Diagnosis not present

## 2014-10-01 DIAGNOSIS — Z79899 Other long term (current) drug therapy: Secondary | ICD-10-CM | POA: Diagnosis not present

## 2014-10-01 DIAGNOSIS — Z9181 History of falling: Secondary | ICD-10-CM | POA: Diagnosis not present

## 2014-10-01 DIAGNOSIS — Z1389 Encounter for screening for other disorder: Secondary | ICD-10-CM | POA: Diagnosis not present

## 2014-10-02 DIAGNOSIS — J449 Chronic obstructive pulmonary disease, unspecified: Secondary | ICD-10-CM | POA: Diagnosis not present

## 2014-10-02 DIAGNOSIS — E119 Type 2 diabetes mellitus without complications: Secondary | ICD-10-CM | POA: Diagnosis not present

## 2014-10-02 DIAGNOSIS — R279 Unspecified lack of coordination: Secondary | ICD-10-CM | POA: Diagnosis not present

## 2014-10-02 DIAGNOSIS — K529 Noninfective gastroenteritis and colitis, unspecified: Secondary | ICD-10-CM | POA: Diagnosis not present

## 2014-10-07 DIAGNOSIS — J449 Chronic obstructive pulmonary disease, unspecified: Secondary | ICD-10-CM | POA: Diagnosis not present

## 2014-10-07 DIAGNOSIS — E119 Type 2 diabetes mellitus without complications: Secondary | ICD-10-CM | POA: Diagnosis not present

## 2014-10-07 DIAGNOSIS — K529 Noninfective gastroenteritis and colitis, unspecified: Secondary | ICD-10-CM | POA: Diagnosis not present

## 2014-10-07 DIAGNOSIS — R279 Unspecified lack of coordination: Secondary | ICD-10-CM | POA: Diagnosis not present

## 2014-10-08 DIAGNOSIS — R279 Unspecified lack of coordination: Secondary | ICD-10-CM | POA: Diagnosis not present

## 2014-10-08 DIAGNOSIS — K529 Noninfective gastroenteritis and colitis, unspecified: Secondary | ICD-10-CM | POA: Diagnosis not present

## 2014-10-08 DIAGNOSIS — J449 Chronic obstructive pulmonary disease, unspecified: Secondary | ICD-10-CM | POA: Diagnosis not present

## 2014-10-08 DIAGNOSIS — E119 Type 2 diabetes mellitus without complications: Secondary | ICD-10-CM | POA: Diagnosis not present

## 2014-10-09 DIAGNOSIS — R279 Unspecified lack of coordination: Secondary | ICD-10-CM | POA: Diagnosis not present

## 2014-10-09 DIAGNOSIS — K529 Noninfective gastroenteritis and colitis, unspecified: Secondary | ICD-10-CM | POA: Diagnosis not present

## 2014-10-09 DIAGNOSIS — J449 Chronic obstructive pulmonary disease, unspecified: Secondary | ICD-10-CM | POA: Diagnosis not present

## 2014-10-09 DIAGNOSIS — E119 Type 2 diabetes mellitus without complications: Secondary | ICD-10-CM | POA: Diagnosis not present

## 2014-10-10 DIAGNOSIS — E119 Type 2 diabetes mellitus without complications: Secondary | ICD-10-CM | POA: Diagnosis not present

## 2014-10-10 DIAGNOSIS — J449 Chronic obstructive pulmonary disease, unspecified: Secondary | ICD-10-CM | POA: Diagnosis not present

## 2014-10-10 DIAGNOSIS — K529 Noninfective gastroenteritis and colitis, unspecified: Secondary | ICD-10-CM | POA: Diagnosis not present

## 2014-10-10 DIAGNOSIS — R279 Unspecified lack of coordination: Secondary | ICD-10-CM | POA: Diagnosis not present

## 2014-10-14 DIAGNOSIS — R279 Unspecified lack of coordination: Secondary | ICD-10-CM | POA: Diagnosis not present

## 2014-10-14 DIAGNOSIS — J449 Chronic obstructive pulmonary disease, unspecified: Secondary | ICD-10-CM | POA: Diagnosis not present

## 2014-10-14 DIAGNOSIS — K529 Noninfective gastroenteritis and colitis, unspecified: Secondary | ICD-10-CM | POA: Diagnosis not present

## 2014-10-14 DIAGNOSIS — E119 Type 2 diabetes mellitus without complications: Secondary | ICD-10-CM | POA: Diagnosis not present

## 2014-10-15 DIAGNOSIS — R279 Unspecified lack of coordination: Secondary | ICD-10-CM | POA: Diagnosis not present

## 2014-10-15 DIAGNOSIS — J449 Chronic obstructive pulmonary disease, unspecified: Secondary | ICD-10-CM | POA: Diagnosis not present

## 2014-10-15 DIAGNOSIS — E119 Type 2 diabetes mellitus without complications: Secondary | ICD-10-CM | POA: Diagnosis not present

## 2014-10-15 DIAGNOSIS — K529 Noninfective gastroenteritis and colitis, unspecified: Secondary | ICD-10-CM | POA: Diagnosis not present

## 2014-10-16 DIAGNOSIS — E119 Type 2 diabetes mellitus without complications: Secondary | ICD-10-CM | POA: Diagnosis not present

## 2014-10-16 DIAGNOSIS — K529 Noninfective gastroenteritis and colitis, unspecified: Secondary | ICD-10-CM | POA: Diagnosis not present

## 2014-10-16 DIAGNOSIS — J449 Chronic obstructive pulmonary disease, unspecified: Secondary | ICD-10-CM | POA: Diagnosis not present

## 2014-10-16 DIAGNOSIS — R279 Unspecified lack of coordination: Secondary | ICD-10-CM | POA: Diagnosis not present

## 2014-10-17 DIAGNOSIS — E119 Type 2 diabetes mellitus without complications: Secondary | ICD-10-CM | POA: Diagnosis not present

## 2014-10-17 DIAGNOSIS — K529 Noninfective gastroenteritis and colitis, unspecified: Secondary | ICD-10-CM | POA: Diagnosis not present

## 2014-10-17 DIAGNOSIS — R279 Unspecified lack of coordination: Secondary | ICD-10-CM | POA: Diagnosis not present

## 2014-10-17 DIAGNOSIS — J449 Chronic obstructive pulmonary disease, unspecified: Secondary | ICD-10-CM | POA: Diagnosis not present

## 2014-10-18 DIAGNOSIS — K529 Noninfective gastroenteritis and colitis, unspecified: Secondary | ICD-10-CM | POA: Diagnosis not present

## 2014-10-18 DIAGNOSIS — R279 Unspecified lack of coordination: Secondary | ICD-10-CM | POA: Diagnosis not present

## 2014-10-18 DIAGNOSIS — E119 Type 2 diabetes mellitus without complications: Secondary | ICD-10-CM | POA: Diagnosis not present

## 2014-10-18 DIAGNOSIS — J449 Chronic obstructive pulmonary disease, unspecified: Secondary | ICD-10-CM | POA: Diagnosis not present

## 2014-10-21 DIAGNOSIS — I10 Essential (primary) hypertension: Secondary | ICD-10-CM | POA: Diagnosis not present

## 2014-10-21 DIAGNOSIS — F17210 Nicotine dependence, cigarettes, uncomplicated: Secondary | ICD-10-CM | POA: Diagnosis not present

## 2014-10-21 DIAGNOSIS — R1010 Upper abdominal pain, unspecified: Secondary | ICD-10-CM | POA: Diagnosis not present

## 2014-10-21 DIAGNOSIS — R1011 Right upper quadrant pain: Secondary | ICD-10-CM | POA: Diagnosis not present

## 2014-10-21 DIAGNOSIS — K219 Gastro-esophageal reflux disease without esophagitis: Secondary | ICD-10-CM | POA: Diagnosis not present

## 2014-10-21 DIAGNOSIS — J449 Chronic obstructive pulmonary disease, unspecified: Secondary | ICD-10-CM | POA: Diagnosis not present

## 2014-10-21 DIAGNOSIS — R110 Nausea: Secondary | ICD-10-CM | POA: Diagnosis not present

## 2014-10-21 DIAGNOSIS — R079 Chest pain, unspecified: Secondary | ICD-10-CM | POA: Diagnosis not present

## 2014-10-21 DIAGNOSIS — R1012 Left upper quadrant pain: Secondary | ICD-10-CM | POA: Diagnosis not present

## 2014-10-21 DIAGNOSIS — K2970 Gastritis, unspecified, without bleeding: Secondary | ICD-10-CM | POA: Diagnosis not present

## 2014-10-22 DIAGNOSIS — J449 Chronic obstructive pulmonary disease, unspecified: Secondary | ICD-10-CM | POA: Diagnosis not present

## 2014-10-22 DIAGNOSIS — K529 Noninfective gastroenteritis and colitis, unspecified: Secondary | ICD-10-CM | POA: Diagnosis not present

## 2014-10-22 DIAGNOSIS — R279 Unspecified lack of coordination: Secondary | ICD-10-CM | POA: Diagnosis not present

## 2014-10-22 DIAGNOSIS — E119 Type 2 diabetes mellitus without complications: Secondary | ICD-10-CM | POA: Diagnosis not present

## 2014-10-23 DIAGNOSIS — J449 Chronic obstructive pulmonary disease, unspecified: Secondary | ICD-10-CM | POA: Diagnosis not present

## 2014-10-23 DIAGNOSIS — E119 Type 2 diabetes mellitus without complications: Secondary | ICD-10-CM | POA: Diagnosis not present

## 2014-10-23 DIAGNOSIS — R279 Unspecified lack of coordination: Secondary | ICD-10-CM | POA: Diagnosis not present

## 2014-10-23 DIAGNOSIS — K529 Noninfective gastroenteritis and colitis, unspecified: Secondary | ICD-10-CM | POA: Diagnosis not present

## 2014-10-28 DIAGNOSIS — J449 Chronic obstructive pulmonary disease, unspecified: Secondary | ICD-10-CM | POA: Diagnosis not present

## 2014-10-28 DIAGNOSIS — E119 Type 2 diabetes mellitus without complications: Secondary | ICD-10-CM | POA: Diagnosis not present

## 2014-10-28 DIAGNOSIS — R279 Unspecified lack of coordination: Secondary | ICD-10-CM | POA: Diagnosis not present

## 2014-10-28 DIAGNOSIS — K529 Noninfective gastroenteritis and colitis, unspecified: Secondary | ICD-10-CM | POA: Diagnosis not present

## 2014-10-29 DIAGNOSIS — J449 Chronic obstructive pulmonary disease, unspecified: Secondary | ICD-10-CM | POA: Diagnosis not present

## 2014-10-29 DIAGNOSIS — R279 Unspecified lack of coordination: Secondary | ICD-10-CM | POA: Diagnosis not present

## 2014-10-29 DIAGNOSIS — E119 Type 2 diabetes mellitus without complications: Secondary | ICD-10-CM | POA: Diagnosis not present

## 2014-10-29 DIAGNOSIS — K529 Noninfective gastroenteritis and colitis, unspecified: Secondary | ICD-10-CM | POA: Diagnosis not present

## 2014-11-05 DIAGNOSIS — R279 Unspecified lack of coordination: Secondary | ICD-10-CM | POA: Diagnosis not present

## 2014-11-05 DIAGNOSIS — E119 Type 2 diabetes mellitus without complications: Secondary | ICD-10-CM | POA: Diagnosis not present

## 2014-11-05 DIAGNOSIS — J449 Chronic obstructive pulmonary disease, unspecified: Secondary | ICD-10-CM | POA: Diagnosis not present

## 2014-11-05 DIAGNOSIS — K529 Noninfective gastroenteritis and colitis, unspecified: Secondary | ICD-10-CM | POA: Diagnosis not present

## 2014-11-07 DIAGNOSIS — E119 Type 2 diabetes mellitus without complications: Secondary | ICD-10-CM | POA: Diagnosis not present

## 2014-11-07 DIAGNOSIS — K529 Noninfective gastroenteritis and colitis, unspecified: Secondary | ICD-10-CM | POA: Diagnosis not present

## 2014-11-07 DIAGNOSIS — R279 Unspecified lack of coordination: Secondary | ICD-10-CM | POA: Diagnosis not present

## 2014-11-07 DIAGNOSIS — J449 Chronic obstructive pulmonary disease, unspecified: Secondary | ICD-10-CM | POA: Diagnosis not present

## 2014-11-12 DIAGNOSIS — J449 Chronic obstructive pulmonary disease, unspecified: Secondary | ICD-10-CM | POA: Diagnosis not present

## 2014-11-12 DIAGNOSIS — E119 Type 2 diabetes mellitus without complications: Secondary | ICD-10-CM | POA: Diagnosis not present

## 2014-11-12 DIAGNOSIS — K529 Noninfective gastroenteritis and colitis, unspecified: Secondary | ICD-10-CM | POA: Diagnosis not present

## 2014-11-12 DIAGNOSIS — R279 Unspecified lack of coordination: Secondary | ICD-10-CM | POA: Diagnosis not present

## 2014-11-13 DIAGNOSIS — R279 Unspecified lack of coordination: Secondary | ICD-10-CM | POA: Diagnosis not present

## 2014-11-13 DIAGNOSIS — J449 Chronic obstructive pulmonary disease, unspecified: Secondary | ICD-10-CM | POA: Diagnosis not present

## 2014-11-13 DIAGNOSIS — E119 Type 2 diabetes mellitus without complications: Secondary | ICD-10-CM | POA: Diagnosis not present

## 2014-11-13 DIAGNOSIS — K529 Noninfective gastroenteritis and colitis, unspecified: Secondary | ICD-10-CM | POA: Diagnosis not present

## 2014-11-14 DIAGNOSIS — R279 Unspecified lack of coordination: Secondary | ICD-10-CM | POA: Diagnosis not present

## 2014-11-14 DIAGNOSIS — K529 Noninfective gastroenteritis and colitis, unspecified: Secondary | ICD-10-CM | POA: Diagnosis not present

## 2014-11-14 DIAGNOSIS — E119 Type 2 diabetes mellitus without complications: Secondary | ICD-10-CM | POA: Diagnosis not present

## 2014-11-14 DIAGNOSIS — J449 Chronic obstructive pulmonary disease, unspecified: Secondary | ICD-10-CM | POA: Diagnosis not present

## 2014-11-15 DIAGNOSIS — J449 Chronic obstructive pulmonary disease, unspecified: Secondary | ICD-10-CM | POA: Diagnosis not present

## 2014-11-15 DIAGNOSIS — E119 Type 2 diabetes mellitus without complications: Secondary | ICD-10-CM | POA: Diagnosis not present

## 2014-11-15 DIAGNOSIS — K529 Noninfective gastroenteritis and colitis, unspecified: Secondary | ICD-10-CM | POA: Diagnosis not present

## 2014-11-15 DIAGNOSIS — R279 Unspecified lack of coordination: Secondary | ICD-10-CM | POA: Diagnosis not present

## 2014-11-26 DIAGNOSIS — J449 Chronic obstructive pulmonary disease, unspecified: Secondary | ICD-10-CM | POA: Diagnosis not present

## 2014-11-26 DIAGNOSIS — R279 Unspecified lack of coordination: Secondary | ICD-10-CM | POA: Diagnosis not present

## 2014-11-26 DIAGNOSIS — K529 Noninfective gastroenteritis and colitis, unspecified: Secondary | ICD-10-CM | POA: Diagnosis not present

## 2014-11-26 DIAGNOSIS — E119 Type 2 diabetes mellitus without complications: Secondary | ICD-10-CM | POA: Diagnosis not present

## 2014-12-25 DIAGNOSIS — E119 Type 2 diabetes mellitus without complications: Secondary | ICD-10-CM | POA: Diagnosis not present

## 2014-12-25 DIAGNOSIS — Z79899 Other long term (current) drug therapy: Secondary | ICD-10-CM | POA: Diagnosis not present

## 2014-12-25 DIAGNOSIS — R112 Nausea with vomiting, unspecified: Secondary | ICD-10-CM | POA: Diagnosis not present

## 2014-12-25 DIAGNOSIS — K219 Gastro-esophageal reflux disease without esophagitis: Secondary | ICD-10-CM | POA: Diagnosis not present

## 2014-12-25 DIAGNOSIS — E785 Hyperlipidemia, unspecified: Secondary | ICD-10-CM | POA: Diagnosis not present

## 2014-12-25 DIAGNOSIS — I10 Essential (primary) hypertension: Secondary | ICD-10-CM | POA: Diagnosis not present

## 2015-01-15 DIAGNOSIS — R112 Nausea with vomiting, unspecified: Secondary | ICD-10-CM | POA: Diagnosis not present

## 2015-01-15 DIAGNOSIS — R195 Other fecal abnormalities: Secondary | ICD-10-CM | POA: Diagnosis not present

## 2015-01-15 DIAGNOSIS — R1031 Right lower quadrant pain: Secondary | ICD-10-CM | POA: Diagnosis not present

## 2015-01-15 DIAGNOSIS — R197 Diarrhea, unspecified: Secondary | ICD-10-CM | POA: Diagnosis not present

## 2015-01-15 DIAGNOSIS — R634 Abnormal weight loss: Secondary | ICD-10-CM | POA: Diagnosis not present

## 2015-01-29 DIAGNOSIS — R1310 Dysphagia, unspecified: Secondary | ICD-10-CM | POA: Diagnosis not present

## 2015-01-29 DIAGNOSIS — R195 Other fecal abnormalities: Secondary | ICD-10-CM | POA: Diagnosis not present

## 2015-01-29 DIAGNOSIS — R197 Diarrhea, unspecified: Secondary | ICD-10-CM | POA: Diagnosis not present

## 2015-01-29 DIAGNOSIS — Z85038 Personal history of other malignant neoplasm of large intestine: Secondary | ICD-10-CM | POA: Diagnosis not present

## 2015-03-04 DIAGNOSIS — R634 Abnormal weight loss: Secondary | ICD-10-CM | POA: Diagnosis not present

## 2015-03-04 DIAGNOSIS — Z85038 Personal history of other malignant neoplasm of large intestine: Secondary | ICD-10-CM | POA: Diagnosis not present

## 2015-03-04 DIAGNOSIS — R1030 Lower abdominal pain, unspecified: Secondary | ICD-10-CM | POA: Diagnosis not present

## 2015-03-04 DIAGNOSIS — K580 Irritable bowel syndrome with diarrhea: Secondary | ICD-10-CM | POA: Diagnosis not present

## 2015-03-06 DIAGNOSIS — K760 Fatty (change of) liver, not elsewhere classified: Secondary | ICD-10-CM | POA: Diagnosis not present

## 2015-03-06 DIAGNOSIS — R1030 Lower abdominal pain, unspecified: Secondary | ICD-10-CM | POA: Diagnosis not present

## 2015-03-06 DIAGNOSIS — N200 Calculus of kidney: Secondary | ICD-10-CM | POA: Diagnosis not present

## 2015-03-06 DIAGNOSIS — R634 Abnormal weight loss: Secondary | ICD-10-CM | POA: Diagnosis not present

## 2015-03-06 DIAGNOSIS — R109 Unspecified abdominal pain: Secondary | ICD-10-CM | POA: Diagnosis not present

## 2015-03-06 DIAGNOSIS — Z85038 Personal history of other malignant neoplasm of large intestine: Secondary | ICD-10-CM | POA: Diagnosis not present

## 2015-03-20 DIAGNOSIS — R634 Abnormal weight loss: Secondary | ICD-10-CM | POA: Diagnosis not present

## 2015-03-20 DIAGNOSIS — R590 Localized enlarged lymph nodes: Secondary | ICD-10-CM | POA: Diagnosis not present

## 2015-03-20 DIAGNOSIS — R195 Other fecal abnormalities: Secondary | ICD-10-CM | POA: Diagnosis not present

## 2015-03-20 DIAGNOSIS — R1030 Lower abdominal pain, unspecified: Secondary | ICD-10-CM | POA: Diagnosis not present

## 2015-03-20 DIAGNOSIS — K580 Irritable bowel syndrome with diarrhea: Secondary | ICD-10-CM | POA: Diagnosis not present

## 2015-04-09 DIAGNOSIS — D892 Hypergammaglobulinemia, unspecified: Secondary | ICD-10-CM | POA: Diagnosis not present

## 2015-04-09 DIAGNOSIS — R590 Localized enlarged lymph nodes: Secondary | ICD-10-CM | POA: Diagnosis not present

## 2015-04-09 DIAGNOSIS — Z85038 Personal history of other malignant neoplasm of large intestine: Secondary | ICD-10-CM | POA: Diagnosis not present

## 2015-04-09 DIAGNOSIS — E876 Hypokalemia: Secondary | ICD-10-CM | POA: Diagnosis not present

## 2015-04-09 DIAGNOSIS — Z7901 Long term (current) use of anticoagulants: Secondary | ICD-10-CM | POA: Diagnosis not present

## 2015-04-15 DIAGNOSIS — E119 Type 2 diabetes mellitus without complications: Secondary | ICD-10-CM | POA: Diagnosis not present

## 2015-04-15 DIAGNOSIS — I10 Essential (primary) hypertension: Secondary | ICD-10-CM | POA: Diagnosis not present

## 2015-04-15 DIAGNOSIS — R634 Abnormal weight loss: Secondary | ICD-10-CM | POA: Diagnosis not present

## 2015-04-15 DIAGNOSIS — K912 Postsurgical malabsorption, not elsewhere classified: Secondary | ICD-10-CM | POA: Diagnosis not present

## 2015-04-22 DIAGNOSIS — J410 Simple chronic bronchitis: Secondary | ICD-10-CM | POA: Diagnosis not present

## 2015-04-22 DIAGNOSIS — Z6823 Body mass index (BMI) 23.0-23.9, adult: Secondary | ICD-10-CM | POA: Diagnosis not present

## 2015-04-22 DIAGNOSIS — Z79899 Other long term (current) drug therapy: Secondary | ICD-10-CM | POA: Diagnosis not present

## 2015-04-22 DIAGNOSIS — R634 Abnormal weight loss: Secondary | ICD-10-CM | POA: Diagnosis not present

## 2015-04-22 DIAGNOSIS — I10 Essential (primary) hypertension: Secondary | ICD-10-CM | POA: Diagnosis not present

## 2015-04-22 DIAGNOSIS — Z1389 Encounter for screening for other disorder: Secondary | ICD-10-CM | POA: Diagnosis not present

## 2015-04-22 DIAGNOSIS — Z9181 History of falling: Secondary | ICD-10-CM | POA: Diagnosis not present

## 2015-04-23 DIAGNOSIS — R05 Cough: Secondary | ICD-10-CM | POA: Diagnosis not present

## 2015-04-23 DIAGNOSIS — R634 Abnormal weight loss: Secondary | ICD-10-CM | POA: Diagnosis not present

## 2015-04-23 DIAGNOSIS — F17200 Nicotine dependence, unspecified, uncomplicated: Secondary | ICD-10-CM | POA: Diagnosis not present

## 2015-06-18 DIAGNOSIS — D890 Polyclonal hypergammaglobulinemia: Secondary | ICD-10-CM | POA: Diagnosis not present

## 2015-06-18 DIAGNOSIS — R590 Localized enlarged lymph nodes: Secondary | ICD-10-CM | POA: Diagnosis not present

## 2015-06-18 DIAGNOSIS — Z85038 Personal history of other malignant neoplasm of large intestine: Secondary | ICD-10-CM | POA: Diagnosis not present

## 2015-06-18 DIAGNOSIS — G8929 Other chronic pain: Secondary | ICD-10-CM | POA: Diagnosis not present

## 2015-06-24 DIAGNOSIS — R1030 Lower abdominal pain, unspecified: Secondary | ICD-10-CM | POA: Diagnosis not present

## 2015-06-24 DIAGNOSIS — R195 Other fecal abnormalities: Secondary | ICD-10-CM | POA: Diagnosis not present

## 2015-06-24 DIAGNOSIS — K634 Enteroptosis: Secondary | ICD-10-CM | POA: Diagnosis not present

## 2015-06-24 DIAGNOSIS — K580 Irritable bowel syndrome with diarrhea: Secondary | ICD-10-CM | POA: Diagnosis not present

## 2015-07-01 DIAGNOSIS — Z1231 Encounter for screening mammogram for malignant neoplasm of breast: Secondary | ICD-10-CM | POA: Diagnosis not present

## 2015-07-01 DIAGNOSIS — M8588 Other specified disorders of bone density and structure, other site: Secondary | ICD-10-CM | POA: Diagnosis not present

## 2015-07-01 DIAGNOSIS — Z0000 Encounter for general adult medical examination without abnormal findings: Secondary | ICD-10-CM | POA: Diagnosis not present

## 2015-07-01 DIAGNOSIS — E119 Type 2 diabetes mellitus without complications: Secondary | ICD-10-CM | POA: Diagnosis not present

## 2015-07-01 DIAGNOSIS — E785 Hyperlipidemia, unspecified: Secondary | ICD-10-CM | POA: Diagnosis not present

## 2015-07-01 DIAGNOSIS — D649 Anemia, unspecified: Secondary | ICD-10-CM | POA: Diagnosis not present

## 2015-07-01 DIAGNOSIS — R109 Unspecified abdominal pain: Secondary | ICD-10-CM | POA: Diagnosis not present

## 2015-07-01 DIAGNOSIS — E559 Vitamin D deficiency, unspecified: Secondary | ICD-10-CM | POA: Diagnosis not present

## 2015-07-01 DIAGNOSIS — Z79899 Other long term (current) drug therapy: Secondary | ICD-10-CM | POA: Diagnosis not present

## 2015-07-01 DIAGNOSIS — I10 Essential (primary) hypertension: Secondary | ICD-10-CM | POA: Diagnosis not present

## 2015-07-14 DIAGNOSIS — Z1231 Encounter for screening mammogram for malignant neoplasm of breast: Secondary | ICD-10-CM | POA: Diagnosis not present

## 2015-07-14 DIAGNOSIS — M810 Age-related osteoporosis without current pathological fracture: Secondary | ICD-10-CM | POA: Diagnosis not present

## 2015-08-21 ENCOUNTER — Other Ambulatory Visit: Payer: Self-pay

## 2015-08-21 NOTE — Patient Outreach (Signed)
[facility]) Care Management  08/21/2015  TIAHNA SHIERS [birth year] [id number]   Unsuccessful attempt to reach patient referred for screening due to high ED utilization.   Unable to leave a voice mail message. RN will make another attempt to reach patient within 10 working days.  Candie Mile, RN, MSN [place] [phone] Fax [phone]

## 2015-08-26 ENCOUNTER — Other Ambulatory Visit: Payer: Self-pay

## 2015-08-26 NOTE — Patient Outreach (Signed)
[facility]) Care Management  08/26/2015  ESRA BARKS 1945/09/04 [id number]   Telephone call to patient for [facility] ED utilization.  No answer.  Unable to leave a message.  Plan: Patient will be attempted again in 1-2 weeks.   Jone Baseman, RN, MSN [place] [phone]

## 2015-09-03 ENCOUNTER — Other Ambulatory Visit: Payer: Self-pay

## 2015-09-03 NOTE — Patient Outreach (Signed)
[facility]) Care Management  09/03/2015  Heather Mccoy 07-Jun-1945 [id number]   Third unsuccessful attempt to reach [facility] patient referred for screening due to high ED utilization.  Unable to leave message.  RN will mail letter to patient with pamphlet and contact information requesting call to [facility].  Candie Mile, RN, MSN [place] [phone] Fax [phone]

## 2015-09-16 DIAGNOSIS — K921 Melena: Secondary | ICD-10-CM | POA: Diagnosis not present

## 2015-09-16 DIAGNOSIS — R110 Nausea: Secondary | ICD-10-CM | POA: Diagnosis not present

## 2015-09-16 DIAGNOSIS — A09 Infectious gastroenteritis and colitis, unspecified: Secondary | ICD-10-CM | POA: Diagnosis not present

## 2015-09-16 DIAGNOSIS — R1084 Generalized abdominal pain: Secondary | ICD-10-CM | POA: Diagnosis not present

## 2015-09-26 DIAGNOSIS — A09 Infectious gastroenteritis and colitis, unspecified: Secondary | ICD-10-CM | POA: Diagnosis not present

## 2015-09-30 DIAGNOSIS — A047 Enterocolitis due to Clostridium difficile: Secondary | ICD-10-CM | POA: Diagnosis not present

## 2015-09-30 DIAGNOSIS — R1084 Generalized abdominal pain: Secondary | ICD-10-CM | POA: Diagnosis not present

## 2015-11-03 DIAGNOSIS — Z9049 Acquired absence of other specified parts of digestive tract: Secondary | ICD-10-CM | POA: Diagnosis not present

## 2015-11-03 DIAGNOSIS — D3502 Benign neoplasm of left adrenal gland: Secondary | ICD-10-CM | POA: Diagnosis not present

## 2015-11-03 DIAGNOSIS — D890 Polyclonal hypergammaglobulinemia: Secondary | ICD-10-CM | POA: Diagnosis not present

## 2015-11-03 DIAGNOSIS — Z85038 Personal history of other malignant neoplasm of large intestine: Secondary | ICD-10-CM | POA: Diagnosis not present

## 2015-11-03 DIAGNOSIS — R1084 Generalized abdominal pain: Secondary | ICD-10-CM | POA: Diagnosis not present

## 2015-11-03 DIAGNOSIS — R109 Unspecified abdominal pain: Secondary | ICD-10-CM | POA: Diagnosis not present

## 2015-11-06 DIAGNOSIS — Z9114 Patient's other noncompliance with medication regimen: Secondary | ICD-10-CM | POA: Diagnosis not present

## 2015-11-06 DIAGNOSIS — M545 Low back pain: Secondary | ICD-10-CM | POA: Diagnosis not present

## 2015-11-06 DIAGNOSIS — R1084 Generalized abdominal pain: Secondary | ICD-10-CM | POA: Diagnosis not present

## 2015-11-06 DIAGNOSIS — R079 Chest pain, unspecified: Secondary | ICD-10-CM | POA: Diagnosis not present

## 2015-11-06 DIAGNOSIS — R0789 Other chest pain: Secondary | ICD-10-CM | POA: Diagnosis not present

## 2015-11-10 DIAGNOSIS — C189 Malignant neoplasm of colon, unspecified: Secondary | ICD-10-CM | POA: Diagnosis not present

## 2015-11-10 DIAGNOSIS — R197 Diarrhea, unspecified: Secondary | ICD-10-CM | POA: Diagnosis not present

## 2015-11-19 DIAGNOSIS — J309 Allergic rhinitis, unspecified: Secondary | ICD-10-CM | POA: Diagnosis not present

## 2015-11-19 DIAGNOSIS — Z79899 Other long term (current) drug therapy: Secondary | ICD-10-CM | POA: Diagnosis not present

## 2015-11-19 DIAGNOSIS — N760 Acute vaginitis: Secondary | ICD-10-CM | POA: Diagnosis not present

## 2015-11-19 DIAGNOSIS — E559 Vitamin D deficiency, unspecified: Secondary | ICD-10-CM | POA: Diagnosis not present

## 2015-11-19 DIAGNOSIS — E119 Type 2 diabetes mellitus without complications: Secondary | ICD-10-CM | POA: Diagnosis not present

## 2015-12-04 DIAGNOSIS — K580 Irritable bowel syndrome with diarrhea: Secondary | ICD-10-CM | POA: Diagnosis not present

## 2015-12-25 DIAGNOSIS — R7301 Impaired fasting glucose: Secondary | ICD-10-CM | POA: Diagnosis not present

## 2015-12-25 DIAGNOSIS — R5383 Other fatigue: Secondary | ICD-10-CM | POA: Diagnosis not present

## 2015-12-25 DIAGNOSIS — R531 Weakness: Secondary | ICD-10-CM | POA: Diagnosis not present

## 2015-12-25 DIAGNOSIS — I1310 Hypertensive heart and chronic kidney disease without heart failure, with stage 1 through stage 4 chronic kidney disease, or unspecified chronic kidney disease: Secondary | ICD-10-CM | POA: Diagnosis not present

## 2015-12-25 DIAGNOSIS — M1711 Unilateral primary osteoarthritis, right knee: Secondary | ICD-10-CM | POA: Diagnosis not present

## 2015-12-25 DIAGNOSIS — F5101 Primary insomnia: Secondary | ICD-10-CM | POA: Diagnosis not present

## 2016-01-08 DIAGNOSIS — K1239 Other oral mucositis (ulcerative): Secondary | ICD-10-CM | POA: Diagnosis not present

## 2016-01-08 DIAGNOSIS — Z0000 Encounter for general adult medical examination without abnormal findings: Secondary | ICD-10-CM | POA: Diagnosis not present

## 2016-02-03 DIAGNOSIS — L298 Other pruritus: Secondary | ICD-10-CM | POA: Diagnosis not present

## 2016-02-03 DIAGNOSIS — F5101 Primary insomnia: Secondary | ICD-10-CM | POA: Diagnosis not present

## 2016-02-03 DIAGNOSIS — I10 Essential (primary) hypertension: Secondary | ICD-10-CM | POA: Diagnosis not present

## 2016-02-03 DIAGNOSIS — S30861A Insect bite (nonvenomous) of abdominal wall, initial encounter: Secondary | ICD-10-CM | POA: Diagnosis not present

## 2016-03-22 DIAGNOSIS — Z0000 Encounter for general adult medical examination without abnormal findings: Secondary | ICD-10-CM | POA: Diagnosis not present

## 2016-03-22 DIAGNOSIS — K1239 Other oral mucositis (ulcerative): Secondary | ICD-10-CM | POA: Diagnosis not present

## 2016-04-03 DIAGNOSIS — J40 Bronchitis, not specified as acute or chronic: Secondary | ICD-10-CM | POA: Diagnosis not present

## 2016-04-03 DIAGNOSIS — R072 Precordial pain: Secondary | ICD-10-CM | POA: Diagnosis not present

## 2016-04-03 DIAGNOSIS — R079 Chest pain, unspecified: Secondary | ICD-10-CM | POA: Diagnosis not present

## 2016-04-03 DIAGNOSIS — R05 Cough: Secondary | ICD-10-CM | POA: Diagnosis not present

## 2016-04-03 DIAGNOSIS — R030 Elevated blood-pressure reading, without diagnosis of hypertension: Secondary | ICD-10-CM | POA: Diagnosis not present

## 2016-04-12 DIAGNOSIS — J208 Acute bronchitis due to other specified organisms: Secondary | ICD-10-CM | POA: Diagnosis not present

## 2016-04-12 DIAGNOSIS — J9801 Acute bronchospasm: Secondary | ICD-10-CM | POA: Diagnosis not present

## 2016-04-12 DIAGNOSIS — J209 Acute bronchitis, unspecified: Secondary | ICD-10-CM | POA: Diagnosis not present

## 2016-05-05 DIAGNOSIS — R1111 Vomiting without nausea: Secondary | ICD-10-CM | POA: Diagnosis not present

## 2016-05-05 DIAGNOSIS — I10 Essential (primary) hypertension: Secondary | ICD-10-CM | POA: Diagnosis not present

## 2016-05-05 DIAGNOSIS — K580 Irritable bowel syndrome with diarrhea: Secondary | ICD-10-CM | POA: Diagnosis not present

## 2016-05-05 DIAGNOSIS — R531 Weakness: Secondary | ICD-10-CM | POA: Diagnosis not present

## 2016-05-05 DIAGNOSIS — R194 Change in bowel habit: Secondary | ICD-10-CM | POA: Diagnosis not present

## 2016-05-11 DIAGNOSIS — K580 Irritable bowel syndrome with diarrhea: Secondary | ICD-10-CM | POA: Diagnosis not present

## 2016-05-11 DIAGNOSIS — R109 Unspecified abdominal pain: Secondary | ICD-10-CM | POA: Diagnosis not present

## 2016-05-11 DIAGNOSIS — R194 Change in bowel habit: Secondary | ICD-10-CM | POA: Diagnosis not present

## 2016-05-11 DIAGNOSIS — R112 Nausea with vomiting, unspecified: Secondary | ICD-10-CM | POA: Diagnosis not present

## 2016-05-26 DIAGNOSIS — Z720 Tobacco use: Secondary | ICD-10-CM

## 2016-05-26 DIAGNOSIS — R768 Other specified abnormal immunological findings in serum: Secondary | ICD-10-CM

## 2016-05-26 DIAGNOSIS — M8580 Other specified disorders of bone density and structure, unspecified site: Secondary | ICD-10-CM

## 2016-05-26 DIAGNOSIS — R197 Diarrhea, unspecified: Secondary | ICD-10-CM | POA: Diagnosis not present

## 2016-05-26 DIAGNOSIS — Z85038 Personal history of other malignant neoplasm of large intestine: Secondary | ICD-10-CM | POA: Diagnosis not present

## 2016-05-26 DIAGNOSIS — R109 Unspecified abdominal pain: Secondary | ICD-10-CM | POA: Diagnosis not present

## 2016-05-26 DIAGNOSIS — R970 Elevated carcinoembryonic antigen [CEA]: Secondary | ICD-10-CM | POA: Diagnosis not present

## 2016-06-11 DIAGNOSIS — R42 Dizziness and giddiness: Secondary | ICD-10-CM | POA: Diagnosis not present

## 2016-06-11 DIAGNOSIS — R05 Cough: Secondary | ICD-10-CM | POA: Diagnosis not present

## 2016-06-11 DIAGNOSIS — K921 Melena: Secondary | ICD-10-CM | POA: Diagnosis not present

## 2016-06-11 DIAGNOSIS — Z79899 Other long term (current) drug therapy: Secondary | ICD-10-CM | POA: Diagnosis not present

## 2016-06-11 DIAGNOSIS — R61 Generalized hyperhidrosis: Secondary | ICD-10-CM | POA: Diagnosis not present

## 2016-06-11 DIAGNOSIS — I951 Orthostatic hypotension: Secondary | ICD-10-CM | POA: Diagnosis not present

## 2016-06-11 DIAGNOSIS — R0789 Other chest pain: Secondary | ICD-10-CM | POA: Diagnosis not present

## 2016-06-11 DIAGNOSIS — R079 Chest pain, unspecified: Secondary | ICD-10-CM | POA: Diagnosis not present

## 2016-06-11 DIAGNOSIS — R55 Syncope and collapse: Secondary | ICD-10-CM | POA: Diagnosis not present

## 2016-06-11 DIAGNOSIS — K279 Peptic ulcer, site unspecified, unspecified as acute or chronic, without hemorrhage or perforation: Secondary | ICD-10-CM | POA: Diagnosis not present

## 2016-06-11 DIAGNOSIS — I208 Other forms of angina pectoris: Secondary | ICD-10-CM | POA: Diagnosis not present

## 2016-09-17 DIAGNOSIS — H353131 Nonexudative age-related macular degeneration, bilateral, early dry stage: Secondary | ICD-10-CM | POA: Diagnosis not present

## 2016-09-17 DIAGNOSIS — H26492 Other secondary cataract, left eye: Secondary | ICD-10-CM | POA: Diagnosis not present

## 2016-09-23 DIAGNOSIS — H35353 Cystoid macular degeneration, bilateral: Secondary | ICD-10-CM | POA: Diagnosis not present

## 2016-10-04 DIAGNOSIS — Z79899 Other long term (current) drug therapy: Secondary | ICD-10-CM | POA: Diagnosis not present

## 2016-10-04 DIAGNOSIS — I517 Cardiomegaly: Secondary | ICD-10-CM | POA: Diagnosis not present

## 2016-10-04 DIAGNOSIS — H35353 Cystoid macular degeneration, bilateral: Secondary | ICD-10-CM | POA: Diagnosis not present

## 2016-10-07 DIAGNOSIS — R2232 Localized swelling, mass and lump, left upper limb: Secondary | ICD-10-CM | POA: Diagnosis not present

## 2016-10-07 DIAGNOSIS — R600 Localized edema: Secondary | ICD-10-CM | POA: Diagnosis not present

## 2016-10-07 DIAGNOSIS — M7989 Other specified soft tissue disorders: Secondary | ICD-10-CM | POA: Diagnosis not present

## 2016-10-14 DIAGNOSIS — H35353 Cystoid macular degeneration, bilateral: Secondary | ICD-10-CM | POA: Diagnosis not present

## 2016-10-15 DIAGNOSIS — F5101 Primary insomnia: Secondary | ICD-10-CM | POA: Diagnosis not present

## 2016-10-15 DIAGNOSIS — Z1231 Encounter for screening mammogram for malignant neoplasm of breast: Secondary | ICD-10-CM | POA: Diagnosis not present

## 2016-10-15 DIAGNOSIS — I10 Essential (primary) hypertension: Secondary | ICD-10-CM | POA: Diagnosis not present

## 2016-10-15 DIAGNOSIS — R600 Localized edema: Secondary | ICD-10-CM | POA: Diagnosis not present

## 2016-11-03 DIAGNOSIS — R590 Localized enlarged lymph nodes: Secondary | ICD-10-CM | POA: Diagnosis not present

## 2016-11-03 DIAGNOSIS — N644 Mastodynia: Secondary | ICD-10-CM | POA: Diagnosis not present

## 2016-11-03 DIAGNOSIS — R928 Other abnormal and inconclusive findings on diagnostic imaging of breast: Secondary | ICD-10-CM | POA: Diagnosis not present

## 2016-11-03 DIAGNOSIS — N6489 Other specified disorders of breast: Secondary | ICD-10-CM | POA: Diagnosis not present

## 2016-11-05 DIAGNOSIS — F5101 Primary insomnia: Secondary | ICD-10-CM | POA: Diagnosis not present

## 2016-11-05 DIAGNOSIS — K580 Irritable bowel syndrome with diarrhea: Secondary | ICD-10-CM | POA: Diagnosis not present

## 2016-11-05 DIAGNOSIS — I10 Essential (primary) hypertension: Secondary | ICD-10-CM | POA: Diagnosis not present

## 2016-11-08 DIAGNOSIS — I10 Essential (primary) hypertension: Secondary | ICD-10-CM | POA: Diagnosis not present

## 2016-11-11 DIAGNOSIS — H35353 Cystoid macular degeneration, bilateral: Secondary | ICD-10-CM | POA: Diagnosis not present

## 2016-12-01 DIAGNOSIS — K580 Irritable bowel syndrome with diarrhea: Secondary | ICD-10-CM | POA: Diagnosis not present

## 2016-12-01 DIAGNOSIS — F5101 Primary insomnia: Secondary | ICD-10-CM | POA: Diagnosis not present

## 2016-12-01 DIAGNOSIS — R600 Localized edema: Secondary | ICD-10-CM | POA: Diagnosis not present

## 2016-12-01 DIAGNOSIS — I10 Essential (primary) hypertension: Secondary | ICD-10-CM | POA: Diagnosis not present

## 2016-12-27 DIAGNOSIS — S50861A Insect bite (nonvenomous) of right forearm, initial encounter: Secondary | ICD-10-CM | POA: Diagnosis not present

## 2016-12-27 DIAGNOSIS — S80862A Insect bite (nonvenomous), left lower leg, initial encounter: Secondary | ICD-10-CM | POA: Diagnosis not present

## 2016-12-27 DIAGNOSIS — I10 Essential (primary) hypertension: Secondary | ICD-10-CM | POA: Diagnosis not present

## 2016-12-27 DIAGNOSIS — K580 Irritable bowel syndrome with diarrhea: Secondary | ICD-10-CM | POA: Diagnosis not present

## 2016-12-27 DIAGNOSIS — S50862A Insect bite (nonvenomous) of left forearm, initial encounter: Secondary | ICD-10-CM | POA: Diagnosis not present

## 2016-12-30 DIAGNOSIS — H35353 Cystoid macular degeneration, bilateral: Secondary | ICD-10-CM | POA: Diagnosis not present

## 2017-01-07 DIAGNOSIS — R109 Unspecified abdominal pain: Secondary | ICD-10-CM | POA: Diagnosis not present

## 2017-01-07 DIAGNOSIS — I700 Atherosclerosis of aorta: Secondary | ICD-10-CM | POA: Diagnosis not present

## 2017-01-07 DIAGNOSIS — R194 Change in bowel habit: Secondary | ICD-10-CM | POA: Diagnosis not present

## 2017-01-07 DIAGNOSIS — R10813 Right lower quadrant abdominal tenderness: Secondary | ICD-10-CM | POA: Diagnosis not present

## 2017-01-07 DIAGNOSIS — D3502 Benign neoplasm of left adrenal gland: Secondary | ICD-10-CM | POA: Diagnosis not present

## 2017-01-20 DIAGNOSIS — R110 Nausea: Secondary | ICD-10-CM | POA: Diagnosis not present

## 2017-01-20 DIAGNOSIS — D649 Anemia, unspecified: Secondary | ICD-10-CM | POA: Diagnosis not present

## 2017-01-20 DIAGNOSIS — K580 Irritable bowel syndrome with diarrhea: Secondary | ICD-10-CM | POA: Diagnosis not present

## 2017-01-26 DIAGNOSIS — I10 Essential (primary) hypertension: Secondary | ICD-10-CM | POA: Diagnosis not present

## 2017-02-24 DIAGNOSIS — H35353 Cystoid macular degeneration, bilateral: Secondary | ICD-10-CM | POA: Diagnosis not present

## 2017-03-14 DIAGNOSIS — D6489 Other specified anemias: Secondary | ICD-10-CM | POA: Diagnosis not present

## 2017-03-14 DIAGNOSIS — Z23 Encounter for immunization: Secondary | ICD-10-CM | POA: Diagnosis not present

## 2017-03-14 DIAGNOSIS — R42 Dizziness and giddiness: Secondary | ICD-10-CM | POA: Diagnosis not present

## 2017-03-14 DIAGNOSIS — M7918 Myalgia, other site: Secondary | ICD-10-CM | POA: Diagnosis not present

## 2017-03-14 DIAGNOSIS — K580 Irritable bowel syndrome with diarrhea: Secondary | ICD-10-CM | POA: Diagnosis not present

## 2017-03-14 DIAGNOSIS — K648 Other hemorrhoids: Secondary | ICD-10-CM | POA: Diagnosis not present

## 2017-03-23 DIAGNOSIS — E538 Deficiency of other specified B group vitamins: Secondary | ICD-10-CM | POA: Diagnosis not present

## 2017-03-30 DIAGNOSIS — E538 Deficiency of other specified B group vitamins: Secondary | ICD-10-CM | POA: Diagnosis not present

## 2017-04-06 DIAGNOSIS — E538 Deficiency of other specified B group vitamins: Secondary | ICD-10-CM | POA: Diagnosis not present

## 2017-04-15 DIAGNOSIS — E538 Deficiency of other specified B group vitamins: Secondary | ICD-10-CM | POA: Diagnosis not present

## 2017-04-15 DIAGNOSIS — K580 Irritable bowel syndrome with diarrhea: Secondary | ICD-10-CM | POA: Diagnosis not present

## 2017-04-15 DIAGNOSIS — D513 Other dietary vitamin B12 deficiency anemia: Secondary | ICD-10-CM | POA: Diagnosis not present

## 2017-04-15 DIAGNOSIS — I10 Essential (primary) hypertension: Secondary | ICD-10-CM | POA: Diagnosis not present

## 2017-05-05 DIAGNOSIS — N950 Postmenopausal bleeding: Secondary | ICD-10-CM | POA: Diagnosis not present

## 2017-05-05 DIAGNOSIS — K580 Irritable bowel syndrome with diarrhea: Secondary | ICD-10-CM | POA: Diagnosis not present

## 2017-05-05 DIAGNOSIS — H35353 Cystoid macular degeneration, bilateral: Secondary | ICD-10-CM | POA: Diagnosis not present

## 2017-05-20 DIAGNOSIS — R55 Syncope and collapse: Secondary | ICD-10-CM | POA: Diagnosis not present

## 2017-05-20 DIAGNOSIS — R404 Transient alteration of awareness: Secondary | ICD-10-CM | POA: Diagnosis not present

## 2017-05-20 DIAGNOSIS — R0600 Dyspnea, unspecified: Secondary | ICD-10-CM | POA: Diagnosis not present

## 2017-05-20 DIAGNOSIS — R1011 Right upper quadrant pain: Secondary | ICD-10-CM | POA: Diagnosis not present

## 2017-05-21 DIAGNOSIS — R200 Anesthesia of skin: Secondary | ICD-10-CM | POA: Diagnosis not present

## 2017-05-21 DIAGNOSIS — K580 Irritable bowel syndrome with diarrhea: Secondary | ICD-10-CM | POA: Diagnosis not present

## 2017-05-21 DIAGNOSIS — Z5329 Procedure and treatment not carried out because of patient's decision for other reasons: Secondary | ICD-10-CM | POA: Diagnosis not present

## 2017-05-21 DIAGNOSIS — E119 Type 2 diabetes mellitus without complications: Secondary | ICD-10-CM | POA: Diagnosis not present

## 2017-05-21 DIAGNOSIS — Z85038 Personal history of other malignant neoplasm of large intestine: Secondary | ICD-10-CM | POA: Diagnosis not present

## 2017-05-21 DIAGNOSIS — R29701 NIHSS score 1: Secondary | ICD-10-CM | POA: Diagnosis not present

## 2017-05-21 DIAGNOSIS — I10 Essential (primary) hypertension: Secondary | ICD-10-CM | POA: Diagnosis not present

## 2017-05-21 DIAGNOSIS — R0600 Dyspnea, unspecified: Secondary | ICD-10-CM | POA: Diagnosis not present

## 2017-05-21 DIAGNOSIS — J449 Chronic obstructive pulmonary disease, unspecified: Secondary | ICD-10-CM | POA: Diagnosis not present

## 2017-05-21 DIAGNOSIS — Z79899 Other long term (current) drug therapy: Secondary | ICD-10-CM | POA: Diagnosis not present

## 2017-05-21 DIAGNOSIS — K219 Gastro-esophageal reflux disease without esophagitis: Secondary | ICD-10-CM | POA: Diagnosis not present

## 2017-05-21 DIAGNOSIS — R55 Syncope and collapse: Secondary | ICD-10-CM | POA: Diagnosis not present

## 2017-05-21 DIAGNOSIS — E876 Hypokalemia: Secondary | ICD-10-CM | POA: Diagnosis not present

## 2017-05-21 DIAGNOSIS — I169 Hypertensive crisis, unspecified: Secondary | ICD-10-CM | POA: Diagnosis not present

## 2017-05-21 DIAGNOSIS — G8929 Other chronic pain: Secondary | ICD-10-CM | POA: Diagnosis not present

## 2017-05-21 DIAGNOSIS — R1011 Right upper quadrant pain: Secondary | ICD-10-CM | POA: Diagnosis not present

## 2017-05-24 DIAGNOSIS — M1991 Primary osteoarthritis, unspecified site: Secondary | ICD-10-CM | POA: Diagnosis not present

## 2017-05-24 DIAGNOSIS — H548 Legal blindness, as defined in USA: Secondary | ICD-10-CM | POA: Diagnosis not present

## 2017-05-24 DIAGNOSIS — R55 Syncope and collapse: Secondary | ICD-10-CM | POA: Diagnosis not present

## 2017-05-24 DIAGNOSIS — C189 Malignant neoplasm of colon, unspecified: Secondary | ICD-10-CM | POA: Diagnosis not present

## 2017-05-24 DIAGNOSIS — D649 Anemia, unspecified: Secondary | ICD-10-CM | POA: Diagnosis not present

## 2017-05-24 DIAGNOSIS — J449 Chronic obstructive pulmonary disease, unspecified: Secondary | ICD-10-CM | POA: Diagnosis not present

## 2017-05-24 DIAGNOSIS — K580 Irritable bowel syndrome with diarrhea: Secondary | ICD-10-CM | POA: Diagnosis not present

## 2017-05-24 DIAGNOSIS — E538 Deficiency of other specified B group vitamins: Secondary | ICD-10-CM | POA: Diagnosis not present

## 2017-05-24 DIAGNOSIS — I110 Hypertensive heart disease with heart failure: Secondary | ICD-10-CM | POA: Diagnosis not present

## 2017-05-24 DIAGNOSIS — I509 Heart failure, unspecified: Secondary | ICD-10-CM | POA: Diagnosis not present

## 2017-05-25 DIAGNOSIS — I110 Hypertensive heart disease with heart failure: Secondary | ICD-10-CM | POA: Diagnosis not present

## 2017-05-25 DIAGNOSIS — D649 Anemia, unspecified: Secondary | ICD-10-CM | POA: Diagnosis not present

## 2017-05-25 DIAGNOSIS — R55 Syncope and collapse: Secondary | ICD-10-CM | POA: Diagnosis not present

## 2017-05-25 DIAGNOSIS — M1991 Primary osteoarthritis, unspecified site: Secondary | ICD-10-CM | POA: Diagnosis not present

## 2017-05-25 DIAGNOSIS — E538 Deficiency of other specified B group vitamins: Secondary | ICD-10-CM | POA: Diagnosis not present

## 2017-05-25 DIAGNOSIS — C189 Malignant neoplasm of colon, unspecified: Secondary | ICD-10-CM | POA: Diagnosis not present

## 2017-05-25 DIAGNOSIS — K580 Irritable bowel syndrome with diarrhea: Secondary | ICD-10-CM | POA: Diagnosis not present

## 2017-05-25 DIAGNOSIS — H548 Legal blindness, as defined in USA: Secondary | ICD-10-CM | POA: Diagnosis not present

## 2017-05-25 DIAGNOSIS — I509 Heart failure, unspecified: Secondary | ICD-10-CM | POA: Diagnosis not present

## 2017-05-25 DIAGNOSIS — J449 Chronic obstructive pulmonary disease, unspecified: Secondary | ICD-10-CM | POA: Diagnosis not present

## 2017-05-30 DIAGNOSIS — K580 Irritable bowel syndrome with diarrhea: Secondary | ICD-10-CM | POA: Diagnosis not present

## 2017-05-30 DIAGNOSIS — H548 Legal blindness, as defined in USA: Secondary | ICD-10-CM | POA: Diagnosis not present

## 2017-05-30 DIAGNOSIS — D649 Anemia, unspecified: Secondary | ICD-10-CM | POA: Diagnosis not present

## 2017-05-30 DIAGNOSIS — M1991 Primary osteoarthritis, unspecified site: Secondary | ICD-10-CM | POA: Diagnosis not present

## 2017-05-30 DIAGNOSIS — I509 Heart failure, unspecified: Secondary | ICD-10-CM | POA: Diagnosis not present

## 2017-05-30 DIAGNOSIS — I110 Hypertensive heart disease with heart failure: Secondary | ICD-10-CM | POA: Diagnosis not present

## 2017-05-30 DIAGNOSIS — C189 Malignant neoplasm of colon, unspecified: Secondary | ICD-10-CM | POA: Diagnosis not present

## 2017-05-30 DIAGNOSIS — R55 Syncope and collapse: Secondary | ICD-10-CM | POA: Diagnosis not present

## 2017-05-30 DIAGNOSIS — E538 Deficiency of other specified B group vitamins: Secondary | ICD-10-CM | POA: Diagnosis not present

## 2017-05-30 DIAGNOSIS — J449 Chronic obstructive pulmonary disease, unspecified: Secondary | ICD-10-CM | POA: Diagnosis not present

## 2017-05-31 DIAGNOSIS — R42 Dizziness and giddiness: Secondary | ICD-10-CM | POA: Diagnosis not present

## 2017-05-31 DIAGNOSIS — I5032 Chronic diastolic (congestive) heart failure: Secondary | ICD-10-CM | POA: Diagnosis not present

## 2017-05-31 DIAGNOSIS — D513 Other dietary vitamin B12 deficiency anemia: Secondary | ICD-10-CM | POA: Diagnosis not present

## 2017-05-31 DIAGNOSIS — R55 Syncope and collapse: Secondary | ICD-10-CM | POA: Diagnosis not present

## 2017-06-01 DIAGNOSIS — H548 Legal blindness, as defined in USA: Secondary | ICD-10-CM | POA: Diagnosis not present

## 2017-06-01 DIAGNOSIS — D649 Anemia, unspecified: Secondary | ICD-10-CM | POA: Diagnosis not present

## 2017-06-01 DIAGNOSIS — K580 Irritable bowel syndrome with diarrhea: Secondary | ICD-10-CM | POA: Diagnosis not present

## 2017-06-01 DIAGNOSIS — I110 Hypertensive heart disease with heart failure: Secondary | ICD-10-CM | POA: Diagnosis not present

## 2017-06-01 DIAGNOSIS — C189 Malignant neoplasm of colon, unspecified: Secondary | ICD-10-CM | POA: Diagnosis not present

## 2017-06-01 DIAGNOSIS — J449 Chronic obstructive pulmonary disease, unspecified: Secondary | ICD-10-CM | POA: Diagnosis not present

## 2017-06-01 DIAGNOSIS — I509 Heart failure, unspecified: Secondary | ICD-10-CM | POA: Diagnosis not present

## 2017-06-01 DIAGNOSIS — R55 Syncope and collapse: Secondary | ICD-10-CM | POA: Diagnosis not present

## 2017-06-01 DIAGNOSIS — E538 Deficiency of other specified B group vitamins: Secondary | ICD-10-CM | POA: Diagnosis not present

## 2017-06-01 DIAGNOSIS — M1991 Primary osteoarthritis, unspecified site: Secondary | ICD-10-CM | POA: Diagnosis not present

## 2017-06-02 DIAGNOSIS — G458 Other transient cerebral ischemic attacks and related syndromes: Secondary | ICD-10-CM | POA: Diagnosis not present

## 2017-06-02 DIAGNOSIS — I6523 Occlusion and stenosis of bilateral carotid arteries: Secondary | ICD-10-CM | POA: Diagnosis not present

## 2017-06-02 DIAGNOSIS — R42 Dizziness and giddiness: Secondary | ICD-10-CM | POA: Diagnosis not present

## 2017-06-02 DIAGNOSIS — I10 Essential (primary) hypertension: Secondary | ICD-10-CM | POA: Diagnosis not present

## 2017-06-02 DIAGNOSIS — R1310 Dysphagia, unspecified: Secondary | ICD-10-CM | POA: Diagnosis not present

## 2017-06-03 DIAGNOSIS — I509 Heart failure, unspecified: Secondary | ICD-10-CM | POA: Diagnosis not present

## 2017-06-03 DIAGNOSIS — C189 Malignant neoplasm of colon, unspecified: Secondary | ICD-10-CM | POA: Diagnosis not present

## 2017-06-03 DIAGNOSIS — I110 Hypertensive heart disease with heart failure: Secondary | ICD-10-CM | POA: Diagnosis not present

## 2017-06-03 DIAGNOSIS — R55 Syncope and collapse: Secondary | ICD-10-CM | POA: Diagnosis not present

## 2017-06-03 DIAGNOSIS — E538 Deficiency of other specified B group vitamins: Secondary | ICD-10-CM | POA: Diagnosis not present

## 2017-06-03 DIAGNOSIS — K580 Irritable bowel syndrome with diarrhea: Secondary | ICD-10-CM | POA: Diagnosis not present

## 2017-06-03 DIAGNOSIS — H548 Legal blindness, as defined in USA: Secondary | ICD-10-CM | POA: Diagnosis not present

## 2017-06-03 DIAGNOSIS — J449 Chronic obstructive pulmonary disease, unspecified: Secondary | ICD-10-CM | POA: Diagnosis not present

## 2017-06-03 DIAGNOSIS — D649 Anemia, unspecified: Secondary | ICD-10-CM | POA: Diagnosis not present

## 2017-06-03 DIAGNOSIS — M1991 Primary osteoarthritis, unspecified site: Secondary | ICD-10-CM | POA: Diagnosis not present

## 2017-06-06 DIAGNOSIS — E538 Deficiency of other specified B group vitamins: Secondary | ICD-10-CM | POA: Diagnosis not present

## 2017-06-06 DIAGNOSIS — I509 Heart failure, unspecified: Secondary | ICD-10-CM | POA: Diagnosis not present

## 2017-06-06 DIAGNOSIS — C189 Malignant neoplasm of colon, unspecified: Secondary | ICD-10-CM | POA: Diagnosis not present

## 2017-06-06 DIAGNOSIS — D649 Anemia, unspecified: Secondary | ICD-10-CM | POA: Diagnosis not present

## 2017-06-06 DIAGNOSIS — K580 Irritable bowel syndrome with diarrhea: Secondary | ICD-10-CM | POA: Diagnosis not present

## 2017-06-06 DIAGNOSIS — J449 Chronic obstructive pulmonary disease, unspecified: Secondary | ICD-10-CM | POA: Diagnosis not present

## 2017-06-06 DIAGNOSIS — H548 Legal blindness, as defined in USA: Secondary | ICD-10-CM | POA: Diagnosis not present

## 2017-06-06 DIAGNOSIS — R55 Syncope and collapse: Secondary | ICD-10-CM | POA: Diagnosis not present

## 2017-06-06 DIAGNOSIS — I110 Hypertensive heart disease with heart failure: Secondary | ICD-10-CM | POA: Diagnosis not present

## 2017-06-06 DIAGNOSIS — M1991 Primary osteoarthritis, unspecified site: Secondary | ICD-10-CM | POA: Diagnosis not present

## 2017-06-07 DIAGNOSIS — E538 Deficiency of other specified B group vitamins: Secondary | ICD-10-CM | POA: Diagnosis not present

## 2017-06-07 DIAGNOSIS — M1991 Primary osteoarthritis, unspecified site: Secondary | ICD-10-CM | POA: Diagnosis not present

## 2017-06-07 DIAGNOSIS — J449 Chronic obstructive pulmonary disease, unspecified: Secondary | ICD-10-CM | POA: Diagnosis not present

## 2017-06-07 DIAGNOSIS — D649 Anemia, unspecified: Secondary | ICD-10-CM | POA: Diagnosis not present

## 2017-06-07 DIAGNOSIS — H548 Legal blindness, as defined in USA: Secondary | ICD-10-CM | POA: Diagnosis not present

## 2017-06-07 DIAGNOSIS — K580 Irritable bowel syndrome with diarrhea: Secondary | ICD-10-CM | POA: Diagnosis not present

## 2017-06-07 DIAGNOSIS — C189 Malignant neoplasm of colon, unspecified: Secondary | ICD-10-CM | POA: Diagnosis not present

## 2017-06-07 DIAGNOSIS — I110 Hypertensive heart disease with heart failure: Secondary | ICD-10-CM | POA: Diagnosis not present

## 2017-06-07 DIAGNOSIS — R55 Syncope and collapse: Secondary | ICD-10-CM | POA: Diagnosis not present

## 2017-06-07 DIAGNOSIS — I509 Heart failure, unspecified: Secondary | ICD-10-CM | POA: Diagnosis not present

## 2017-06-08 DIAGNOSIS — I110 Hypertensive heart disease with heart failure: Secondary | ICD-10-CM | POA: Diagnosis not present

## 2017-06-08 DIAGNOSIS — E538 Deficiency of other specified B group vitamins: Secondary | ICD-10-CM | POA: Diagnosis not present

## 2017-06-08 DIAGNOSIS — C189 Malignant neoplasm of colon, unspecified: Secondary | ICD-10-CM | POA: Diagnosis not present

## 2017-06-08 DIAGNOSIS — D649 Anemia, unspecified: Secondary | ICD-10-CM | POA: Diagnosis not present

## 2017-06-08 DIAGNOSIS — H548 Legal blindness, as defined in USA: Secondary | ICD-10-CM | POA: Diagnosis not present

## 2017-06-08 DIAGNOSIS — R51 Headache: Secondary | ICD-10-CM | POA: Diagnosis not present

## 2017-06-08 DIAGNOSIS — M1991 Primary osteoarthritis, unspecified site: Secondary | ICD-10-CM | POA: Diagnosis not present

## 2017-06-08 DIAGNOSIS — J449 Chronic obstructive pulmonary disease, unspecified: Secondary | ICD-10-CM | POA: Diagnosis not present

## 2017-06-08 DIAGNOSIS — K580 Irritable bowel syndrome with diarrhea: Secondary | ICD-10-CM | POA: Diagnosis not present

## 2017-06-08 DIAGNOSIS — I10 Essential (primary) hypertension: Secondary | ICD-10-CM | POA: Diagnosis not present

## 2017-06-08 DIAGNOSIS — R55 Syncope and collapse: Secondary | ICD-10-CM | POA: Diagnosis not present

## 2017-06-08 DIAGNOSIS — I509 Heart failure, unspecified: Secondary | ICD-10-CM | POA: Diagnosis not present

## 2017-06-09 DIAGNOSIS — M1991 Primary osteoarthritis, unspecified site: Secondary | ICD-10-CM | POA: Diagnosis not present

## 2017-06-09 DIAGNOSIS — R55 Syncope and collapse: Secondary | ICD-10-CM | POA: Diagnosis not present

## 2017-06-09 DIAGNOSIS — C189 Malignant neoplasm of colon, unspecified: Secondary | ICD-10-CM | POA: Diagnosis not present

## 2017-06-09 DIAGNOSIS — E538 Deficiency of other specified B group vitamins: Secondary | ICD-10-CM | POA: Diagnosis not present

## 2017-06-09 DIAGNOSIS — I110 Hypertensive heart disease with heart failure: Secondary | ICD-10-CM | POA: Diagnosis not present

## 2017-06-09 DIAGNOSIS — I509 Heart failure, unspecified: Secondary | ICD-10-CM | POA: Diagnosis not present

## 2017-06-09 DIAGNOSIS — H548 Legal blindness, as defined in USA: Secondary | ICD-10-CM | POA: Diagnosis not present

## 2017-06-09 DIAGNOSIS — J449 Chronic obstructive pulmonary disease, unspecified: Secondary | ICD-10-CM | POA: Diagnosis not present

## 2017-06-09 DIAGNOSIS — D649 Anemia, unspecified: Secondary | ICD-10-CM | POA: Diagnosis not present

## 2017-06-09 DIAGNOSIS — K580 Irritable bowel syndrome with diarrhea: Secondary | ICD-10-CM | POA: Diagnosis not present

## 2017-06-14 DIAGNOSIS — J449 Chronic obstructive pulmonary disease, unspecified: Secondary | ICD-10-CM | POA: Diagnosis not present

## 2017-06-14 DIAGNOSIS — M1991 Primary osteoarthritis, unspecified site: Secondary | ICD-10-CM | POA: Diagnosis not present

## 2017-06-14 DIAGNOSIS — I110 Hypertensive heart disease with heart failure: Secondary | ICD-10-CM | POA: Diagnosis not present

## 2017-06-14 DIAGNOSIS — H548 Legal blindness, as defined in USA: Secondary | ICD-10-CM | POA: Diagnosis not present

## 2017-06-14 DIAGNOSIS — K580 Irritable bowel syndrome with diarrhea: Secondary | ICD-10-CM | POA: Diagnosis not present

## 2017-06-14 DIAGNOSIS — R55 Syncope and collapse: Secondary | ICD-10-CM | POA: Diagnosis not present

## 2017-06-14 DIAGNOSIS — D649 Anemia, unspecified: Secondary | ICD-10-CM | POA: Diagnosis not present

## 2017-06-14 DIAGNOSIS — C189 Malignant neoplasm of colon, unspecified: Secondary | ICD-10-CM | POA: Diagnosis not present

## 2017-06-14 DIAGNOSIS — E538 Deficiency of other specified B group vitamins: Secondary | ICD-10-CM | POA: Diagnosis not present

## 2017-06-14 DIAGNOSIS — I509 Heart failure, unspecified: Secondary | ICD-10-CM | POA: Diagnosis not present

## 2017-06-16 DIAGNOSIS — M1991 Primary osteoarthritis, unspecified site: Secondary | ICD-10-CM | POA: Diagnosis not present

## 2017-06-16 DIAGNOSIS — K580 Irritable bowel syndrome with diarrhea: Secondary | ICD-10-CM | POA: Diagnosis not present

## 2017-06-16 DIAGNOSIS — I110 Hypertensive heart disease with heart failure: Secondary | ICD-10-CM | POA: Diagnosis not present

## 2017-06-16 DIAGNOSIS — E538 Deficiency of other specified B group vitamins: Secondary | ICD-10-CM | POA: Diagnosis not present

## 2017-06-16 DIAGNOSIS — I509 Heart failure, unspecified: Secondary | ICD-10-CM | POA: Diagnosis not present

## 2017-06-16 DIAGNOSIS — J449 Chronic obstructive pulmonary disease, unspecified: Secondary | ICD-10-CM | POA: Diagnosis not present

## 2017-06-16 DIAGNOSIS — C189 Malignant neoplasm of colon, unspecified: Secondary | ICD-10-CM | POA: Diagnosis not present

## 2017-06-16 DIAGNOSIS — R55 Syncope and collapse: Secondary | ICD-10-CM | POA: Diagnosis not present

## 2017-06-16 DIAGNOSIS — H548 Legal blindness, as defined in USA: Secondary | ICD-10-CM | POA: Diagnosis not present

## 2017-06-16 DIAGNOSIS — D649 Anemia, unspecified: Secondary | ICD-10-CM | POA: Diagnosis not present

## 2017-06-23 DIAGNOSIS — D649 Anemia, unspecified: Secondary | ICD-10-CM | POA: Diagnosis not present

## 2017-06-23 DIAGNOSIS — E538 Deficiency of other specified B group vitamins: Secondary | ICD-10-CM | POA: Diagnosis not present

## 2017-06-23 DIAGNOSIS — C189 Malignant neoplasm of colon, unspecified: Secondary | ICD-10-CM | POA: Diagnosis not present

## 2017-06-23 DIAGNOSIS — I509 Heart failure, unspecified: Secondary | ICD-10-CM | POA: Diagnosis not present

## 2017-06-23 DIAGNOSIS — R55 Syncope and collapse: Secondary | ICD-10-CM | POA: Diagnosis not present

## 2017-06-23 DIAGNOSIS — H548 Legal blindness, as defined in USA: Secondary | ICD-10-CM | POA: Diagnosis not present

## 2017-06-23 DIAGNOSIS — K580 Irritable bowel syndrome with diarrhea: Secondary | ICD-10-CM | POA: Diagnosis not present

## 2017-06-23 DIAGNOSIS — I110 Hypertensive heart disease with heart failure: Secondary | ICD-10-CM | POA: Diagnosis not present

## 2017-06-23 DIAGNOSIS — J449 Chronic obstructive pulmonary disease, unspecified: Secondary | ICD-10-CM | POA: Diagnosis not present

## 2017-06-23 DIAGNOSIS — M1991 Primary osteoarthritis, unspecified site: Secondary | ICD-10-CM | POA: Diagnosis not present

## 2017-06-29 DIAGNOSIS — M1991 Primary osteoarthritis, unspecified site: Secondary | ICD-10-CM | POA: Diagnosis not present

## 2017-06-29 DIAGNOSIS — I110 Hypertensive heart disease with heart failure: Secondary | ICD-10-CM | POA: Diagnosis not present

## 2017-06-29 DIAGNOSIS — R55 Syncope and collapse: Secondary | ICD-10-CM | POA: Diagnosis not present

## 2017-06-29 DIAGNOSIS — D649 Anemia, unspecified: Secondary | ICD-10-CM | POA: Diagnosis not present

## 2017-06-29 DIAGNOSIS — I509 Heart failure, unspecified: Secondary | ICD-10-CM | POA: Diagnosis not present

## 2017-06-29 DIAGNOSIS — C189 Malignant neoplasm of colon, unspecified: Secondary | ICD-10-CM | POA: Diagnosis not present

## 2017-06-29 DIAGNOSIS — K580 Irritable bowel syndrome with diarrhea: Secondary | ICD-10-CM | POA: Diagnosis not present

## 2017-06-29 DIAGNOSIS — H548 Legal blindness, as defined in USA: Secondary | ICD-10-CM | POA: Diagnosis not present

## 2017-06-29 DIAGNOSIS — E538 Deficiency of other specified B group vitamins: Secondary | ICD-10-CM | POA: Diagnosis not present

## 2017-06-29 DIAGNOSIS — J449 Chronic obstructive pulmonary disease, unspecified: Secondary | ICD-10-CM | POA: Diagnosis not present

## 2017-06-30 DIAGNOSIS — D518 Other vitamin B12 deficiency anemias: Secondary | ICD-10-CM | POA: Diagnosis not present

## 2017-06-30 DIAGNOSIS — I10 Essential (primary) hypertension: Secondary | ICD-10-CM | POA: Diagnosis not present

## 2017-06-30 DIAGNOSIS — R0789 Other chest pain: Secondary | ICD-10-CM | POA: Diagnosis not present

## 2017-07-04 DIAGNOSIS — H35353 Cystoid macular degeneration, bilateral: Secondary | ICD-10-CM | POA: Diagnosis not present

## 2017-07-04 DIAGNOSIS — E538 Deficiency of other specified B group vitamins: Secondary | ICD-10-CM | POA: Diagnosis not present

## 2017-07-05 DIAGNOSIS — J449 Chronic obstructive pulmonary disease, unspecified: Secondary | ICD-10-CM | POA: Diagnosis not present

## 2017-07-05 DIAGNOSIS — E538 Deficiency of other specified B group vitamins: Secondary | ICD-10-CM | POA: Diagnosis not present

## 2017-07-05 DIAGNOSIS — M1991 Primary osteoarthritis, unspecified site: Secondary | ICD-10-CM | POA: Diagnosis not present

## 2017-07-05 DIAGNOSIS — D649 Anemia, unspecified: Secondary | ICD-10-CM | POA: Diagnosis not present

## 2017-07-05 DIAGNOSIS — H548 Legal blindness, as defined in USA: Secondary | ICD-10-CM | POA: Diagnosis not present

## 2017-07-05 DIAGNOSIS — I509 Heart failure, unspecified: Secondary | ICD-10-CM | POA: Diagnosis not present

## 2017-07-05 DIAGNOSIS — C189 Malignant neoplasm of colon, unspecified: Secondary | ICD-10-CM | POA: Diagnosis not present

## 2017-07-05 DIAGNOSIS — I110 Hypertensive heart disease with heart failure: Secondary | ICD-10-CM | POA: Diagnosis not present

## 2017-07-05 DIAGNOSIS — K580 Irritable bowel syndrome with diarrhea: Secondary | ICD-10-CM | POA: Diagnosis not present

## 2017-07-05 DIAGNOSIS — R55 Syncope and collapse: Secondary | ICD-10-CM | POA: Diagnosis not present

## 2017-07-07 DIAGNOSIS — H35353 Cystoid macular degeneration, bilateral: Secondary | ICD-10-CM | POA: Diagnosis not present

## 2017-07-11 DIAGNOSIS — D510 Vitamin B12 deficiency anemia due to intrinsic factor deficiency: Secondary | ICD-10-CM | POA: Diagnosis not present

## 2017-07-11 DIAGNOSIS — I10 Essential (primary) hypertension: Secondary | ICD-10-CM | POA: Diagnosis not present

## 2017-07-11 DIAGNOSIS — M25561 Pain in right knee: Secondary | ICD-10-CM | POA: Diagnosis not present

## 2017-07-13 DIAGNOSIS — D649 Anemia, unspecified: Secondary | ICD-10-CM | POA: Diagnosis not present

## 2017-07-13 DIAGNOSIS — C189 Malignant neoplasm of colon, unspecified: Secondary | ICD-10-CM | POA: Diagnosis not present

## 2017-07-13 DIAGNOSIS — K580 Irritable bowel syndrome with diarrhea: Secondary | ICD-10-CM | POA: Diagnosis not present

## 2017-07-13 DIAGNOSIS — I509 Heart failure, unspecified: Secondary | ICD-10-CM | POA: Diagnosis not present

## 2017-07-13 DIAGNOSIS — R55 Syncope and collapse: Secondary | ICD-10-CM | POA: Diagnosis not present

## 2017-07-13 DIAGNOSIS — H548 Legal blindness, as defined in USA: Secondary | ICD-10-CM | POA: Diagnosis not present

## 2017-07-13 DIAGNOSIS — J449 Chronic obstructive pulmonary disease, unspecified: Secondary | ICD-10-CM | POA: Diagnosis not present

## 2017-07-13 DIAGNOSIS — M1991 Primary osteoarthritis, unspecified site: Secondary | ICD-10-CM | POA: Diagnosis not present

## 2017-07-13 DIAGNOSIS — I110 Hypertensive heart disease with heart failure: Secondary | ICD-10-CM | POA: Diagnosis not present

## 2017-07-13 DIAGNOSIS — E538 Deficiency of other specified B group vitamins: Secondary | ICD-10-CM | POA: Diagnosis not present

## 2017-07-18 DIAGNOSIS — E538 Deficiency of other specified B group vitamins: Secondary | ICD-10-CM | POA: Diagnosis not present

## 2017-07-18 DIAGNOSIS — M25561 Pain in right knee: Secondary | ICD-10-CM | POA: Diagnosis not present

## 2017-07-21 DIAGNOSIS — J449 Chronic obstructive pulmonary disease, unspecified: Secondary | ICD-10-CM | POA: Diagnosis not present

## 2017-07-21 DIAGNOSIS — D649 Anemia, unspecified: Secondary | ICD-10-CM | POA: Diagnosis not present

## 2017-07-21 DIAGNOSIS — I509 Heart failure, unspecified: Secondary | ICD-10-CM | POA: Diagnosis not present

## 2017-07-21 DIAGNOSIS — R55 Syncope and collapse: Secondary | ICD-10-CM | POA: Diagnosis not present

## 2017-07-21 DIAGNOSIS — I110 Hypertensive heart disease with heart failure: Secondary | ICD-10-CM | POA: Diagnosis not present

## 2017-07-21 DIAGNOSIS — C189 Malignant neoplasm of colon, unspecified: Secondary | ICD-10-CM | POA: Diagnosis not present

## 2017-07-21 DIAGNOSIS — E538 Deficiency of other specified B group vitamins: Secondary | ICD-10-CM | POA: Diagnosis not present

## 2017-07-21 DIAGNOSIS — H548 Legal blindness, as defined in USA: Secondary | ICD-10-CM | POA: Diagnosis not present

## 2017-07-21 DIAGNOSIS — M1991 Primary osteoarthritis, unspecified site: Secondary | ICD-10-CM | POA: Diagnosis not present

## 2017-07-21 DIAGNOSIS — K580 Irritable bowel syndrome with diarrhea: Secondary | ICD-10-CM | POA: Diagnosis not present

## 2017-07-25 DIAGNOSIS — D510 Vitamin B12 deficiency anemia due to intrinsic factor deficiency: Secondary | ICD-10-CM | POA: Diagnosis not present

## 2017-07-25 DIAGNOSIS — R002 Palpitations: Secondary | ICD-10-CM | POA: Diagnosis not present

## 2017-07-25 DIAGNOSIS — I10 Essential (primary) hypertension: Secondary | ICD-10-CM | POA: Diagnosis not present

## 2017-08-01 DIAGNOSIS — R55 Syncope and collapse: Secondary | ICD-10-CM | POA: Diagnosis not present

## 2017-08-01 DIAGNOSIS — R51 Headache: Secondary | ICD-10-CM | POA: Diagnosis not present

## 2017-08-01 DIAGNOSIS — R079 Chest pain, unspecified: Secondary | ICD-10-CM | POA: Diagnosis not present

## 2017-08-01 DIAGNOSIS — R42 Dizziness and giddiness: Secondary | ICD-10-CM | POA: Diagnosis not present

## 2017-08-01 DIAGNOSIS — R072 Precordial pain: Secondary | ICD-10-CM | POA: Diagnosis not present

## 2017-08-01 DIAGNOSIS — M940 Chondrocostal junction syndrome [Tietze]: Secondary | ICD-10-CM | POA: Diagnosis not present

## 2017-08-01 DIAGNOSIS — R002 Palpitations: Secondary | ICD-10-CM | POA: Diagnosis not present

## 2017-08-01 DIAGNOSIS — E860 Dehydration: Secondary | ICD-10-CM | POA: Diagnosis not present

## 2017-08-02 DIAGNOSIS — Z79899 Other long term (current) drug therapy: Secondary | ICD-10-CM | POA: Diagnosis not present

## 2017-08-02 DIAGNOSIS — R29700 NIHSS score 0: Secondary | ICD-10-CM | POA: Diagnosis not present

## 2017-08-02 DIAGNOSIS — R001 Bradycardia, unspecified: Secondary | ICD-10-CM | POA: Diagnosis not present

## 2017-08-02 DIAGNOSIS — I10 Essential (primary) hypertension: Secondary | ICD-10-CM | POA: Diagnosis not present

## 2017-08-02 DIAGNOSIS — M940 Chondrocostal junction syndrome [Tietze]: Secondary | ICD-10-CM | POA: Diagnosis not present

## 2017-08-02 DIAGNOSIS — Z85038 Personal history of other malignant neoplasm of large intestine: Secondary | ICD-10-CM | POA: Diagnosis not present

## 2017-08-02 DIAGNOSIS — R42 Dizziness and giddiness: Secondary | ICD-10-CM | POA: Diagnosis not present

## 2017-08-02 DIAGNOSIS — Z7982 Long term (current) use of aspirin: Secondary | ICD-10-CM | POA: Diagnosis not present

## 2017-08-02 DIAGNOSIS — K589 Irritable bowel syndrome without diarrhea: Secondary | ICD-10-CM | POA: Diagnosis not present

## 2017-08-02 DIAGNOSIS — K219 Gastro-esophageal reflux disease without esophagitis: Secondary | ICD-10-CM | POA: Diagnosis not present

## 2017-08-02 DIAGNOSIS — E860 Dehydration: Secondary | ICD-10-CM | POA: Diagnosis not present

## 2017-08-02 DIAGNOSIS — R55 Syncope and collapse: Secondary | ICD-10-CM | POA: Diagnosis not present

## 2017-08-02 DIAGNOSIS — R079 Chest pain, unspecified: Secondary | ICD-10-CM | POA: Diagnosis not present

## 2017-08-03 DIAGNOSIS — R55 Syncope and collapse: Secondary | ICD-10-CM | POA: Diagnosis not present

## 2017-08-03 DIAGNOSIS — E860 Dehydration: Secondary | ICD-10-CM | POA: Diagnosis not present

## 2017-08-03 DIAGNOSIS — R079 Chest pain, unspecified: Secondary | ICD-10-CM | POA: Diagnosis not present

## 2017-08-03 DIAGNOSIS — M940 Chondrocostal junction syndrome [Tietze]: Secondary | ICD-10-CM | POA: Diagnosis not present

## 2017-08-06 DIAGNOSIS — C189 Malignant neoplasm of colon, unspecified: Secondary | ICD-10-CM | POA: Diagnosis not present

## 2017-08-06 DIAGNOSIS — I509 Heart failure, unspecified: Secondary | ICD-10-CM | POA: Diagnosis not present

## 2017-08-06 DIAGNOSIS — Z7982 Long term (current) use of aspirin: Secondary | ICD-10-CM | POA: Diagnosis not present

## 2017-08-06 DIAGNOSIS — M1991 Primary osteoarthritis, unspecified site: Secondary | ICD-10-CM | POA: Diagnosis not present

## 2017-08-06 DIAGNOSIS — Z87891 Personal history of nicotine dependence: Secondary | ICD-10-CM | POA: Diagnosis not present

## 2017-08-06 DIAGNOSIS — Z85038 Personal history of other malignant neoplasm of large intestine: Secondary | ICD-10-CM | POA: Diagnosis not present

## 2017-08-06 DIAGNOSIS — I110 Hypertensive heart disease with heart failure: Secondary | ICD-10-CM | POA: Diagnosis not present

## 2017-08-06 DIAGNOSIS — K580 Irritable bowel syndrome with diarrhea: Secondary | ICD-10-CM | POA: Diagnosis not present

## 2017-08-06 DIAGNOSIS — H548 Legal blindness, as defined in USA: Secondary | ICD-10-CM | POA: Diagnosis not present

## 2017-08-06 DIAGNOSIS — R55 Syncope and collapse: Secondary | ICD-10-CM | POA: Diagnosis not present

## 2017-08-06 DIAGNOSIS — E538 Deficiency of other specified B group vitamins: Secondary | ICD-10-CM | POA: Diagnosis not present

## 2017-08-06 DIAGNOSIS — K589 Irritable bowel syndrome without diarrhea: Secondary | ICD-10-CM | POA: Diagnosis not present

## 2017-08-06 DIAGNOSIS — D649 Anemia, unspecified: Secondary | ICD-10-CM | POA: Diagnosis not present

## 2017-08-06 DIAGNOSIS — J449 Chronic obstructive pulmonary disease, unspecified: Secondary | ICD-10-CM | POA: Diagnosis not present

## 2017-08-06 DIAGNOSIS — Z602 Problems related to living alone: Secondary | ICD-10-CM | POA: Diagnosis not present

## 2017-08-23 DIAGNOSIS — J449 Chronic obstructive pulmonary disease, unspecified: Secondary | ICD-10-CM | POA: Diagnosis not present

## 2017-08-23 DIAGNOSIS — Z85038 Personal history of other malignant neoplasm of large intestine: Secondary | ICD-10-CM | POA: Diagnosis not present

## 2017-08-23 DIAGNOSIS — M1991 Primary osteoarthritis, unspecified site: Secondary | ICD-10-CM | POA: Diagnosis not present

## 2017-08-23 DIAGNOSIS — E538 Deficiency of other specified B group vitamins: Secondary | ICD-10-CM | POA: Diagnosis not present

## 2017-08-23 DIAGNOSIS — Z602 Problems related to living alone: Secondary | ICD-10-CM | POA: Diagnosis not present

## 2017-08-23 DIAGNOSIS — I509 Heart failure, unspecified: Secondary | ICD-10-CM | POA: Diagnosis not present

## 2017-08-23 DIAGNOSIS — R55 Syncope and collapse: Secondary | ICD-10-CM | POA: Diagnosis not present

## 2017-08-23 DIAGNOSIS — H548 Legal blindness, as defined in USA: Secondary | ICD-10-CM | POA: Diagnosis not present

## 2017-08-23 DIAGNOSIS — K589 Irritable bowel syndrome without diarrhea: Secondary | ICD-10-CM | POA: Diagnosis not present

## 2017-08-23 DIAGNOSIS — Z87891 Personal history of nicotine dependence: Secondary | ICD-10-CM | POA: Diagnosis not present

## 2017-08-23 DIAGNOSIS — Z7982 Long term (current) use of aspirin: Secondary | ICD-10-CM | POA: Diagnosis not present

## 2017-08-23 DIAGNOSIS — D649 Anemia, unspecified: Secondary | ICD-10-CM | POA: Diagnosis not present

## 2017-08-23 DIAGNOSIS — I110 Hypertensive heart disease with heart failure: Secondary | ICD-10-CM | POA: Diagnosis not present

## 2017-08-28 DIAGNOSIS — D649 Anemia, unspecified: Secondary | ICD-10-CM | POA: Diagnosis not present

## 2017-08-28 DIAGNOSIS — M1991 Primary osteoarthritis, unspecified site: Secondary | ICD-10-CM | POA: Diagnosis not present

## 2017-08-28 DIAGNOSIS — H548 Legal blindness, as defined in USA: Secondary | ICD-10-CM | POA: Diagnosis not present

## 2017-08-28 DIAGNOSIS — R55 Syncope and collapse: Secondary | ICD-10-CM | POA: Diagnosis not present

## 2017-08-28 DIAGNOSIS — Z602 Problems related to living alone: Secondary | ICD-10-CM | POA: Diagnosis not present

## 2017-08-28 DIAGNOSIS — I509 Heart failure, unspecified: Secondary | ICD-10-CM | POA: Diagnosis not present

## 2017-08-28 DIAGNOSIS — Z85038 Personal history of other malignant neoplasm of large intestine: Secondary | ICD-10-CM | POA: Diagnosis not present

## 2017-08-28 DIAGNOSIS — I110 Hypertensive heart disease with heart failure: Secondary | ICD-10-CM | POA: Diagnosis not present

## 2017-08-28 DIAGNOSIS — J449 Chronic obstructive pulmonary disease, unspecified: Secondary | ICD-10-CM | POA: Diagnosis not present

## 2017-08-28 DIAGNOSIS — K589 Irritable bowel syndrome without diarrhea: Secondary | ICD-10-CM | POA: Diagnosis not present

## 2017-08-28 DIAGNOSIS — Z87891 Personal history of nicotine dependence: Secondary | ICD-10-CM | POA: Diagnosis not present

## 2017-08-28 DIAGNOSIS — E538 Deficiency of other specified B group vitamins: Secondary | ICD-10-CM | POA: Diagnosis not present

## 2017-08-28 DIAGNOSIS — Z7982 Long term (current) use of aspirin: Secondary | ICD-10-CM | POA: Diagnosis not present

## 2017-08-30 DIAGNOSIS — D649 Anemia, unspecified: Secondary | ICD-10-CM | POA: Diagnosis not present

## 2017-08-30 DIAGNOSIS — E538 Deficiency of other specified B group vitamins: Secondary | ICD-10-CM | POA: Diagnosis not present

## 2017-08-30 DIAGNOSIS — R55 Syncope and collapse: Secondary | ICD-10-CM | POA: Diagnosis not present

## 2017-08-30 DIAGNOSIS — J449 Chronic obstructive pulmonary disease, unspecified: Secondary | ICD-10-CM | POA: Diagnosis not present

## 2017-08-30 DIAGNOSIS — I509 Heart failure, unspecified: Secondary | ICD-10-CM | POA: Diagnosis not present

## 2017-08-30 DIAGNOSIS — Z7982 Long term (current) use of aspirin: Secondary | ICD-10-CM | POA: Diagnosis not present

## 2017-08-30 DIAGNOSIS — I110 Hypertensive heart disease with heart failure: Secondary | ICD-10-CM | POA: Diagnosis not present

## 2017-08-30 DIAGNOSIS — Z602 Problems related to living alone: Secondary | ICD-10-CM | POA: Diagnosis not present

## 2017-08-30 DIAGNOSIS — K589 Irritable bowel syndrome without diarrhea: Secondary | ICD-10-CM | POA: Diagnosis not present

## 2017-08-30 DIAGNOSIS — Z85038 Personal history of other malignant neoplasm of large intestine: Secondary | ICD-10-CM | POA: Diagnosis not present

## 2017-08-30 DIAGNOSIS — M1991 Primary osteoarthritis, unspecified site: Secondary | ICD-10-CM | POA: Diagnosis not present

## 2017-08-30 DIAGNOSIS — H548 Legal blindness, as defined in USA: Secondary | ICD-10-CM | POA: Diagnosis not present

## 2017-08-30 DIAGNOSIS — Z87891 Personal history of nicotine dependence: Secondary | ICD-10-CM | POA: Diagnosis not present

## 2017-09-06 DIAGNOSIS — J449 Chronic obstructive pulmonary disease, unspecified: Secondary | ICD-10-CM | POA: Diagnosis not present

## 2017-09-06 DIAGNOSIS — I509 Heart failure, unspecified: Secondary | ICD-10-CM | POA: Diagnosis not present

## 2017-09-06 DIAGNOSIS — R55 Syncope and collapse: Secondary | ICD-10-CM | POA: Diagnosis not present

## 2017-09-06 DIAGNOSIS — I110 Hypertensive heart disease with heart failure: Secondary | ICD-10-CM | POA: Diagnosis not present

## 2017-09-06 DIAGNOSIS — M1991 Primary osteoarthritis, unspecified site: Secondary | ICD-10-CM | POA: Diagnosis not present

## 2017-09-06 DIAGNOSIS — Z87891 Personal history of nicotine dependence: Secondary | ICD-10-CM | POA: Diagnosis not present

## 2017-09-06 DIAGNOSIS — Z7982 Long term (current) use of aspirin: Secondary | ICD-10-CM | POA: Diagnosis not present

## 2017-09-06 DIAGNOSIS — Z602 Problems related to living alone: Secondary | ICD-10-CM | POA: Diagnosis not present

## 2017-09-06 DIAGNOSIS — H548 Legal blindness, as defined in USA: Secondary | ICD-10-CM | POA: Diagnosis not present

## 2017-09-06 DIAGNOSIS — Z85038 Personal history of other malignant neoplasm of large intestine: Secondary | ICD-10-CM | POA: Diagnosis not present

## 2017-09-06 DIAGNOSIS — E538 Deficiency of other specified B group vitamins: Secondary | ICD-10-CM | POA: Diagnosis not present

## 2017-09-06 DIAGNOSIS — D649 Anemia, unspecified: Secondary | ICD-10-CM | POA: Diagnosis not present

## 2017-09-06 DIAGNOSIS — K589 Irritable bowel syndrome without diarrhea: Secondary | ICD-10-CM | POA: Diagnosis not present

## 2017-09-13 DIAGNOSIS — I110 Hypertensive heart disease with heart failure: Secondary | ICD-10-CM | POA: Diagnosis not present

## 2017-09-13 DIAGNOSIS — H548 Legal blindness, as defined in USA: Secondary | ICD-10-CM | POA: Diagnosis not present

## 2017-09-13 DIAGNOSIS — Z602 Problems related to living alone: Secondary | ICD-10-CM | POA: Diagnosis not present

## 2017-09-13 DIAGNOSIS — I509 Heart failure, unspecified: Secondary | ICD-10-CM | POA: Diagnosis not present

## 2017-09-13 DIAGNOSIS — E538 Deficiency of other specified B group vitamins: Secondary | ICD-10-CM | POA: Diagnosis not present

## 2017-09-13 DIAGNOSIS — J449 Chronic obstructive pulmonary disease, unspecified: Secondary | ICD-10-CM | POA: Diagnosis not present

## 2017-09-13 DIAGNOSIS — M1991 Primary osteoarthritis, unspecified site: Secondary | ICD-10-CM | POA: Diagnosis not present

## 2017-09-13 DIAGNOSIS — R55 Syncope and collapse: Secondary | ICD-10-CM | POA: Diagnosis not present

## 2017-09-13 DIAGNOSIS — Z85038 Personal history of other malignant neoplasm of large intestine: Secondary | ICD-10-CM | POA: Diagnosis not present

## 2017-09-13 DIAGNOSIS — Z7982 Long term (current) use of aspirin: Secondary | ICD-10-CM | POA: Diagnosis not present

## 2017-09-13 DIAGNOSIS — K589 Irritable bowel syndrome without diarrhea: Secondary | ICD-10-CM | POA: Diagnosis not present

## 2017-09-13 DIAGNOSIS — D649 Anemia, unspecified: Secondary | ICD-10-CM | POA: Diagnosis not present

## 2017-09-13 DIAGNOSIS — Z87891 Personal history of nicotine dependence: Secondary | ICD-10-CM | POA: Diagnosis not present

## 2017-09-20 DIAGNOSIS — I110 Hypertensive heart disease with heart failure: Secondary | ICD-10-CM | POA: Diagnosis not present

## 2017-09-20 DIAGNOSIS — M1991 Primary osteoarthritis, unspecified site: Secondary | ICD-10-CM | POA: Diagnosis not present

## 2017-09-20 DIAGNOSIS — Z7982 Long term (current) use of aspirin: Secondary | ICD-10-CM | POA: Diagnosis not present

## 2017-09-20 DIAGNOSIS — Z87891 Personal history of nicotine dependence: Secondary | ICD-10-CM | POA: Diagnosis not present

## 2017-09-20 DIAGNOSIS — Z85038 Personal history of other malignant neoplasm of large intestine: Secondary | ICD-10-CM | POA: Diagnosis not present

## 2017-09-20 DIAGNOSIS — I509 Heart failure, unspecified: Secondary | ICD-10-CM | POA: Diagnosis not present

## 2017-09-20 DIAGNOSIS — H548 Legal blindness, as defined in USA: Secondary | ICD-10-CM | POA: Diagnosis not present

## 2017-09-20 DIAGNOSIS — E538 Deficiency of other specified B group vitamins: Secondary | ICD-10-CM | POA: Diagnosis not present

## 2017-09-20 DIAGNOSIS — R55 Syncope and collapse: Secondary | ICD-10-CM | POA: Diagnosis not present

## 2017-09-20 DIAGNOSIS — Z602 Problems related to living alone: Secondary | ICD-10-CM | POA: Diagnosis not present

## 2017-09-20 DIAGNOSIS — D649 Anemia, unspecified: Secondary | ICD-10-CM | POA: Diagnosis not present

## 2017-09-20 DIAGNOSIS — J449 Chronic obstructive pulmonary disease, unspecified: Secondary | ICD-10-CM | POA: Diagnosis not present

## 2017-09-20 DIAGNOSIS — K589 Irritable bowel syndrome without diarrhea: Secondary | ICD-10-CM | POA: Diagnosis not present

## 2017-09-28 DIAGNOSIS — D649 Anemia, unspecified: Secondary | ICD-10-CM | POA: Diagnosis not present

## 2017-09-28 DIAGNOSIS — K589 Irritable bowel syndrome without diarrhea: Secondary | ICD-10-CM | POA: Diagnosis not present

## 2017-09-28 DIAGNOSIS — R55 Syncope and collapse: Secondary | ICD-10-CM | POA: Diagnosis not present

## 2017-09-28 DIAGNOSIS — J449 Chronic obstructive pulmonary disease, unspecified: Secondary | ICD-10-CM | POA: Diagnosis not present

## 2017-09-28 DIAGNOSIS — E538 Deficiency of other specified B group vitamins: Secondary | ICD-10-CM | POA: Diagnosis not present

## 2017-09-28 DIAGNOSIS — Z7982 Long term (current) use of aspirin: Secondary | ICD-10-CM | POA: Diagnosis not present

## 2017-09-28 DIAGNOSIS — Z602 Problems related to living alone: Secondary | ICD-10-CM | POA: Diagnosis not present

## 2017-09-28 DIAGNOSIS — Z87891 Personal history of nicotine dependence: Secondary | ICD-10-CM | POA: Diagnosis not present

## 2017-09-28 DIAGNOSIS — I509 Heart failure, unspecified: Secondary | ICD-10-CM | POA: Diagnosis not present

## 2017-09-28 DIAGNOSIS — I110 Hypertensive heart disease with heart failure: Secondary | ICD-10-CM | POA: Diagnosis not present

## 2017-09-28 DIAGNOSIS — H548 Legal blindness, as defined in USA: Secondary | ICD-10-CM | POA: Diagnosis not present

## 2017-09-28 DIAGNOSIS — M1991 Primary osteoarthritis, unspecified site: Secondary | ICD-10-CM | POA: Diagnosis not present

## 2017-09-28 DIAGNOSIS — Z85038 Personal history of other malignant neoplasm of large intestine: Secondary | ICD-10-CM | POA: Diagnosis not present

## 2017-10-04 DIAGNOSIS — K589 Irritable bowel syndrome without diarrhea: Secondary | ICD-10-CM | POA: Diagnosis not present

## 2017-10-04 DIAGNOSIS — J449 Chronic obstructive pulmonary disease, unspecified: Secondary | ICD-10-CM | POA: Diagnosis not present

## 2017-10-04 DIAGNOSIS — Z602 Problems related to living alone: Secondary | ICD-10-CM | POA: Diagnosis not present

## 2017-10-04 DIAGNOSIS — E538 Deficiency of other specified B group vitamins: Secondary | ICD-10-CM | POA: Diagnosis not present

## 2017-10-04 DIAGNOSIS — R55 Syncope and collapse: Secondary | ICD-10-CM | POA: Diagnosis not present

## 2017-10-04 DIAGNOSIS — Z85038 Personal history of other malignant neoplasm of large intestine: Secondary | ICD-10-CM | POA: Diagnosis not present

## 2017-10-04 DIAGNOSIS — I110 Hypertensive heart disease with heart failure: Secondary | ICD-10-CM | POA: Diagnosis not present

## 2017-10-04 DIAGNOSIS — Z87891 Personal history of nicotine dependence: Secondary | ICD-10-CM | POA: Diagnosis not present

## 2017-10-04 DIAGNOSIS — I509 Heart failure, unspecified: Secondary | ICD-10-CM | POA: Diagnosis not present

## 2017-10-04 DIAGNOSIS — M1991 Primary osteoarthritis, unspecified site: Secondary | ICD-10-CM | POA: Diagnosis not present

## 2017-10-04 DIAGNOSIS — D649 Anemia, unspecified: Secondary | ICD-10-CM | POA: Diagnosis not present

## 2017-10-04 DIAGNOSIS — Z7982 Long term (current) use of aspirin: Secondary | ICD-10-CM | POA: Diagnosis not present

## 2017-10-04 DIAGNOSIS — H548 Legal blindness, as defined in USA: Secondary | ICD-10-CM | POA: Diagnosis not present

## 2017-10-21 DIAGNOSIS — D649 Anemia, unspecified: Secondary | ICD-10-CM | POA: Diagnosis not present

## 2017-10-21 DIAGNOSIS — Z85038 Personal history of other malignant neoplasm of large intestine: Secondary | ICD-10-CM | POA: Diagnosis not present

## 2017-10-21 DIAGNOSIS — R630 Anorexia: Secondary | ICD-10-CM | POA: Diagnosis not present

## 2017-10-21 DIAGNOSIS — Z720 Tobacco use: Secondary | ICD-10-CM | POA: Diagnosis not present

## 2017-10-21 DIAGNOSIS — R112 Nausea with vomiting, unspecified: Secondary | ICD-10-CM | POA: Diagnosis not present

## 2017-10-21 DIAGNOSIS — R109 Unspecified abdominal pain: Secondary | ICD-10-CM | POA: Diagnosis not present

## 2017-10-21 DIAGNOSIS — E538 Deficiency of other specified B group vitamins: Secondary | ICD-10-CM | POA: Diagnosis not present

## 2017-10-21 DIAGNOSIS — R197 Diarrhea, unspecified: Secondary | ICD-10-CM | POA: Diagnosis not present

## 2017-10-21 DIAGNOSIS — M810 Age-related osteoporosis without current pathological fracture: Secondary | ICD-10-CM | POA: Diagnosis not present

## 2017-10-21 DIAGNOSIS — G8929 Other chronic pain: Secondary | ICD-10-CM | POA: Diagnosis not present

## 2017-10-21 DIAGNOSIS — E611 Iron deficiency: Secondary | ICD-10-CM | POA: Diagnosis not present

## 2017-10-26 DIAGNOSIS — D513 Other dietary vitamin B12 deficiency anemia: Secondary | ICD-10-CM | POA: Diagnosis not present

## 2017-10-26 DIAGNOSIS — K9089 Other intestinal malabsorption: Secondary | ICD-10-CM | POA: Diagnosis not present

## 2017-10-26 DIAGNOSIS — R42 Dizziness and giddiness: Secondary | ICD-10-CM | POA: Diagnosis not present

## 2018-02-09 DIAGNOSIS — J441 Chronic obstructive pulmonary disease with (acute) exacerbation: Secondary | ICD-10-CM | POA: Diagnosis not present

## 2018-02-09 DIAGNOSIS — J0100 Acute maxillary sinusitis, unspecified: Secondary | ICD-10-CM | POA: Diagnosis not present

## 2018-02-21 DIAGNOSIS — M7552 Bursitis of left shoulder: Secondary | ICD-10-CM | POA: Diagnosis not present

## 2018-02-21 DIAGNOSIS — J441 Chronic obstructive pulmonary disease with (acute) exacerbation: Secondary | ICD-10-CM | POA: Diagnosis not present

## 2018-02-22 DIAGNOSIS — M25512 Pain in left shoulder: Secondary | ICD-10-CM | POA: Diagnosis not present

## 2018-02-22 DIAGNOSIS — J449 Chronic obstructive pulmonary disease, unspecified: Secondary | ICD-10-CM | POA: Diagnosis not present

## 2018-02-22 DIAGNOSIS — M25572 Pain in left ankle and joints of left foot: Secondary | ICD-10-CM | POA: Diagnosis not present

## 2018-02-22 DIAGNOSIS — J441 Chronic obstructive pulmonary disease with (acute) exacerbation: Secondary | ICD-10-CM | POA: Diagnosis not present

## 2018-03-07 DIAGNOSIS — M7552 Bursitis of left shoulder: Secondary | ICD-10-CM | POA: Diagnosis not present

## 2018-03-07 DIAGNOSIS — G540 Brachial plexus disorders: Secondary | ICD-10-CM | POA: Diagnosis not present

## 2018-03-14 DIAGNOSIS — K580 Irritable bowel syndrome with diarrhea: Secondary | ICD-10-CM | POA: Diagnosis not present

## 2018-03-14 DIAGNOSIS — R197 Diarrhea, unspecified: Secondary | ICD-10-CM | POA: Diagnosis not present

## 2018-03-15 DIAGNOSIS — M542 Cervicalgia: Secondary | ICD-10-CM | POA: Diagnosis not present

## 2018-03-15 DIAGNOSIS — M549 Dorsalgia, unspecified: Secondary | ICD-10-CM | POA: Diagnosis not present

## 2018-03-15 DIAGNOSIS — S0990XA Unspecified injury of head, initial encounter: Secondary | ICD-10-CM | POA: Diagnosis not present

## 2018-03-15 DIAGNOSIS — Z743 Need for continuous supervision: Secondary | ICD-10-CM | POA: Diagnosis not present

## 2018-03-15 DIAGNOSIS — R51 Headache: Secondary | ICD-10-CM | POA: Diagnosis not present

## 2018-03-15 DIAGNOSIS — S199XXA Unspecified injury of neck, initial encounter: Secondary | ICD-10-CM | POA: Diagnosis not present

## 2018-03-15 DIAGNOSIS — M5489 Other dorsalgia: Secondary | ICD-10-CM | POA: Diagnosis not present

## 2018-03-15 DIAGNOSIS — M545 Low back pain: Secondary | ICD-10-CM | POA: Diagnosis not present

## 2018-03-15 DIAGNOSIS — W19XXXA Unspecified fall, initial encounter: Secondary | ICD-10-CM | POA: Diagnosis not present

## 2018-03-15 DIAGNOSIS — S3993XA Unspecified injury of pelvis, initial encounter: Secondary | ICD-10-CM | POA: Diagnosis not present

## 2018-03-20 DIAGNOSIS — M545 Low back pain: Secondary | ICD-10-CM | POA: Diagnosis not present

## 2018-03-27 DIAGNOSIS — M7552 Bursitis of left shoulder: Secondary | ICD-10-CM | POA: Diagnosis not present

## 2018-03-27 DIAGNOSIS — M545 Low back pain: Secondary | ICD-10-CM | POA: Diagnosis not present

## 2018-03-27 DIAGNOSIS — M810 Age-related osteoporosis without current pathological fracture: Secondary | ICD-10-CM | POA: Diagnosis not present

## 2018-03-27 DIAGNOSIS — G540 Brachial plexus disorders: Secondary | ICD-10-CM | POA: Diagnosis not present

## 2018-04-04 ENCOUNTER — Encounter: Payer: Self-pay | Admitting: Neurology

## 2018-04-07 DIAGNOSIS — M545 Low back pain: Secondary | ICD-10-CM | POA: Diagnosis not present

## 2018-04-21 DIAGNOSIS — S299XXA Unspecified injury of thorax, initial encounter: Secondary | ICD-10-CM | POA: Diagnosis not present

## 2018-04-21 DIAGNOSIS — M4856XA Collapsed vertebra, not elsewhere classified, lumbar region, initial encounter for fracture: Secondary | ICD-10-CM | POA: Diagnosis not present

## 2018-04-25 DIAGNOSIS — M545 Low back pain: Secondary | ICD-10-CM | POA: Diagnosis not present

## 2018-04-25 DIAGNOSIS — R197 Diarrhea, unspecified: Secondary | ICD-10-CM | POA: Diagnosis not present

## 2018-05-08 DIAGNOSIS — K219 Gastro-esophageal reflux disease without esophagitis: Secondary | ICD-10-CM | POA: Diagnosis not present

## 2018-05-08 DIAGNOSIS — Z87891 Personal history of nicotine dependence: Secondary | ICD-10-CM | POA: Diagnosis not present

## 2018-05-08 DIAGNOSIS — E119 Type 2 diabetes mellitus without complications: Secondary | ICD-10-CM | POA: Diagnosis not present

## 2018-05-08 DIAGNOSIS — M159 Polyosteoarthritis, unspecified: Secondary | ICD-10-CM | POA: Diagnosis not present

## 2018-05-08 DIAGNOSIS — J449 Chronic obstructive pulmonary disease, unspecified: Secondary | ICD-10-CM | POA: Diagnosis not present

## 2018-05-08 DIAGNOSIS — D509 Iron deficiency anemia, unspecified: Secondary | ICD-10-CM | POA: Diagnosis not present

## 2018-05-08 DIAGNOSIS — S32010A Wedge compression fracture of first lumbar vertebra, initial encounter for closed fracture: Secondary | ICD-10-CM | POA: Diagnosis not present

## 2018-05-08 DIAGNOSIS — Z79899 Other long term (current) drug therapy: Secondary | ICD-10-CM | POA: Diagnosis not present

## 2018-05-08 DIAGNOSIS — Z7982 Long term (current) use of aspirin: Secondary | ICD-10-CM | POA: Diagnosis not present

## 2018-05-08 DIAGNOSIS — S32000A Wedge compression fracture of unspecified lumbar vertebra, initial encounter for closed fracture: Secondary | ICD-10-CM | POA: Diagnosis not present

## 2018-05-08 DIAGNOSIS — E7800 Pure hypercholesterolemia, unspecified: Secondary | ICD-10-CM | POA: Diagnosis not present

## 2018-05-08 DIAGNOSIS — E039 Hypothyroidism, unspecified: Secondary | ICD-10-CM | POA: Diagnosis not present

## 2018-05-08 DIAGNOSIS — I2510 Atherosclerotic heart disease of native coronary artery without angina pectoris: Secondary | ICD-10-CM | POA: Diagnosis not present

## 2018-05-08 DIAGNOSIS — I10 Essential (primary) hypertension: Secondary | ICD-10-CM | POA: Diagnosis not present

## 2018-05-30 DIAGNOSIS — M7502 Adhesive capsulitis of left shoulder: Secondary | ICD-10-CM | POA: Diagnosis not present

## 2018-06-20 DIAGNOSIS — G5602 Carpal tunnel syndrome, left upper limb: Secondary | ICD-10-CM | POA: Diagnosis not present

## 2018-06-21 DIAGNOSIS — J018 Other acute sinusitis: Secondary | ICD-10-CM | POA: Diagnosis not present

## 2018-06-21 DIAGNOSIS — B370 Candidal stomatitis: Secondary | ICD-10-CM | POA: Diagnosis not present

## 2018-07-03 DIAGNOSIS — I2510 Atherosclerotic heart disease of native coronary artery without angina pectoris: Secondary | ICD-10-CM | POA: Diagnosis not present

## 2018-07-03 DIAGNOSIS — D509 Iron deficiency anemia, unspecified: Secondary | ICD-10-CM | POA: Diagnosis not present

## 2018-07-03 DIAGNOSIS — Z87891 Personal history of nicotine dependence: Secondary | ICD-10-CM | POA: Diagnosis not present

## 2018-07-03 DIAGNOSIS — G5602 Carpal tunnel syndrome, left upper limb: Secondary | ICD-10-CM | POA: Diagnosis not present

## 2018-07-03 DIAGNOSIS — Z85038 Personal history of other malignant neoplasm of large intestine: Secondary | ICD-10-CM | POA: Diagnosis not present

## 2018-07-03 DIAGNOSIS — Z980 Intestinal bypass and anastomosis status: Secondary | ICD-10-CM | POA: Diagnosis not present

## 2018-07-03 DIAGNOSIS — Z7982 Long term (current) use of aspirin: Secondary | ICD-10-CM | POA: Diagnosis not present

## 2018-07-03 DIAGNOSIS — K219 Gastro-esophageal reflux disease without esophagitis: Secondary | ICD-10-CM | POA: Diagnosis not present

## 2018-07-03 DIAGNOSIS — I10 Essential (primary) hypertension: Secondary | ICD-10-CM | POA: Diagnosis not present

## 2018-07-03 DIAGNOSIS — K580 Irritable bowel syndrome with diarrhea: Secondary | ICD-10-CM | POA: Diagnosis not present

## 2018-07-03 DIAGNOSIS — Z79899 Other long term (current) drug therapy: Secondary | ICD-10-CM | POA: Diagnosis not present

## 2018-07-19 DIAGNOSIS — R05 Cough: Secondary | ICD-10-CM | POA: Diagnosis not present

## 2018-07-19 DIAGNOSIS — K580 Irritable bowel syndrome with diarrhea: Secondary | ICD-10-CM | POA: Diagnosis not present

## 2018-07-19 DIAGNOSIS — R1312 Dysphagia, oropharyngeal phase: Secondary | ICD-10-CM | POA: Diagnosis not present

## 2018-07-19 DIAGNOSIS — I10 Essential (primary) hypertension: Secondary | ICD-10-CM | POA: Diagnosis not present

## 2018-07-19 DIAGNOSIS — J410 Simple chronic bronchitis: Secondary | ICD-10-CM | POA: Diagnosis not present

## 2018-07-19 DIAGNOSIS — R7301 Impaired fasting glucose: Secondary | ICD-10-CM | POA: Diagnosis not present

## 2018-07-25 DIAGNOSIS — R1312 Dysphagia, oropharyngeal phase: Secondary | ICD-10-CM | POA: Diagnosis not present

## 2018-08-10 DIAGNOSIS — Z0000 Encounter for general adult medical examination without abnormal findings: Secondary | ICD-10-CM | POA: Diagnosis not present

## 2018-08-10 DIAGNOSIS — J018 Other acute sinusitis: Secondary | ICD-10-CM | POA: Diagnosis not present

## 2018-08-15 DIAGNOSIS — J208 Acute bronchitis due to other specified organisms: Secondary | ICD-10-CM | POA: Diagnosis not present

## 2018-08-15 DIAGNOSIS — R05 Cough: Secondary | ICD-10-CM | POA: Diagnosis not present

## 2018-09-05 DIAGNOSIS — G5602 Carpal tunnel syndrome, left upper limb: Secondary | ICD-10-CM | POA: Diagnosis not present

## 2018-09-07 DIAGNOSIS — K219 Gastro-esophageal reflux disease without esophagitis: Secondary | ICD-10-CM | POA: Diagnosis not present

## 2018-09-07 DIAGNOSIS — R1312 Dysphagia, oropharyngeal phase: Secondary | ICD-10-CM | POA: Diagnosis not present

## 2018-09-07 DIAGNOSIS — J301 Allergic rhinitis due to pollen: Secondary | ICD-10-CM | POA: Diagnosis not present

## 2018-10-11 DIAGNOSIS — G5600 Carpal tunnel syndrome, unspecified upper limb: Secondary | ICD-10-CM | POA: Diagnosis not present

## 2018-10-11 DIAGNOSIS — G5602 Carpal tunnel syndrome, left upper limb: Secondary | ICD-10-CM | POA: Diagnosis not present

## 2018-10-12 DIAGNOSIS — R1310 Dysphagia, unspecified: Secondary | ICD-10-CM | POA: Diagnosis not present

## 2018-10-12 DIAGNOSIS — G8929 Other chronic pain: Secondary | ICD-10-CM | POA: Diagnosis not present

## 2018-10-12 DIAGNOSIS — K580 Irritable bowel syndrome with diarrhea: Secondary | ICD-10-CM | POA: Diagnosis not present

## 2018-10-12 DIAGNOSIS — Z85038 Personal history of other malignant neoplasm of large intestine: Secondary | ICD-10-CM | POA: Diagnosis not present

## 2018-10-18 DIAGNOSIS — R5383 Other fatigue: Secondary | ICD-10-CM | POA: Diagnosis not present

## 2018-10-18 DIAGNOSIS — K219 Gastro-esophageal reflux disease without esophagitis: Secondary | ICD-10-CM | POA: Diagnosis not present

## 2018-10-18 DIAGNOSIS — Z23 Encounter for immunization: Secondary | ICD-10-CM | POA: Diagnosis not present

## 2018-10-18 DIAGNOSIS — Z1159 Encounter for screening for other viral diseases: Secondary | ICD-10-CM | POA: Diagnosis not present

## 2018-10-18 DIAGNOSIS — R1312 Dysphagia, oropharyngeal phase: Secondary | ICD-10-CM | POA: Diagnosis not present

## 2018-10-18 DIAGNOSIS — R05 Cough: Secondary | ICD-10-CM | POA: Diagnosis not present

## 2018-10-18 DIAGNOSIS — J028 Acute pharyngitis due to other specified organisms: Secondary | ICD-10-CM | POA: Diagnosis not present

## 2018-10-25 DIAGNOSIS — R1310 Dysphagia, unspecified: Secondary | ICD-10-CM | POA: Diagnosis not present

## 2018-10-25 DIAGNOSIS — K580 Irritable bowel syndrome with diarrhea: Secondary | ICD-10-CM | POA: Diagnosis not present

## 2018-10-25 DIAGNOSIS — Z85038 Personal history of other malignant neoplasm of large intestine: Secondary | ICD-10-CM | POA: Diagnosis not present

## 2018-10-25 DIAGNOSIS — R1084 Generalized abdominal pain: Secondary | ICD-10-CM | POA: Diagnosis not present

## 2018-10-25 HISTORY — PX: COLONOSCOPY: SHX174

## 2018-10-25 HISTORY — PX: ESOPHAGOGASTRODUODENOSCOPY: SHX1529

## 2018-10-27 DIAGNOSIS — R1312 Dysphagia, oropharyngeal phase: Secondary | ICD-10-CM | POA: Diagnosis not present

## 2018-10-27 DIAGNOSIS — R7301 Impaired fasting glucose: Secondary | ICD-10-CM | POA: Diagnosis not present

## 2018-10-27 DIAGNOSIS — K580 Irritable bowel syndrome with diarrhea: Secondary | ICD-10-CM | POA: Diagnosis not present

## 2018-10-27 DIAGNOSIS — I10 Essential (primary) hypertension: Secondary | ICD-10-CM | POA: Diagnosis not present

## 2018-11-07 DIAGNOSIS — G5602 Carpal tunnel syndrome, left upper limb: Secondary | ICD-10-CM | POA: Diagnosis not present

## 2018-11-07 DIAGNOSIS — S59902A Unspecified injury of left elbow, initial encounter: Secondary | ICD-10-CM | POA: Diagnosis not present

## 2018-11-07 DIAGNOSIS — S4992XA Unspecified injury of left shoulder and upper arm, initial encounter: Secondary | ICD-10-CM | POA: Diagnosis not present

## 2018-11-14 DIAGNOSIS — K580 Irritable bowel syndrome with diarrhea: Secondary | ICD-10-CM | POA: Diagnosis not present

## 2018-11-20 DIAGNOSIS — N6489 Other specified disorders of breast: Secondary | ICD-10-CM | POA: Diagnosis not present

## 2018-11-20 DIAGNOSIS — R918 Other nonspecific abnormal finding of lung field: Secondary | ICD-10-CM | POA: Diagnosis not present

## 2018-11-20 DIAGNOSIS — R928 Other abnormal and inconclusive findings on diagnostic imaging of breast: Secondary | ICD-10-CM | POA: Diagnosis not present

## 2018-11-20 DIAGNOSIS — M810 Age-related osteoporosis without current pathological fracture: Secondary | ICD-10-CM | POA: Diagnosis not present

## 2018-12-05 DIAGNOSIS — S4992XA Unspecified injury of left shoulder and upper arm, initial encounter: Secondary | ICD-10-CM | POA: Diagnosis not present

## 2018-12-07 DIAGNOSIS — N3000 Acute cystitis without hematuria: Secondary | ICD-10-CM | POA: Diagnosis not present

## 2018-12-07 DIAGNOSIS — J441 Chronic obstructive pulmonary disease with (acute) exacerbation: Secondary | ICD-10-CM | POA: Diagnosis not present

## 2018-12-13 DIAGNOSIS — S53432A Radial collateral ligament sprain of left elbow, initial encounter: Secondary | ICD-10-CM | POA: Diagnosis not present

## 2018-12-13 DIAGNOSIS — M25512 Pain in left shoulder: Secondary | ICD-10-CM | POA: Diagnosis not present

## 2018-12-13 DIAGNOSIS — S5332XA Traumatic rupture of left ulnar collateral ligament, initial encounter: Secondary | ICD-10-CM | POA: Diagnosis not present

## 2018-12-13 DIAGNOSIS — M19012 Primary osteoarthritis, left shoulder: Secondary | ICD-10-CM | POA: Diagnosis not present

## 2018-12-19 DIAGNOSIS — S4992XA Unspecified injury of left shoulder and upper arm, initial encounter: Secondary | ICD-10-CM | POA: Diagnosis not present

## 2018-12-19 DIAGNOSIS — S59902A Unspecified injury of left elbow, initial encounter: Secondary | ICD-10-CM | POA: Diagnosis not present

## 2018-12-19 DIAGNOSIS — M19012 Primary osteoarthritis, left shoulder: Secondary | ICD-10-CM | POA: Diagnosis not present

## 2018-12-25 DIAGNOSIS — R600 Localized edema: Secondary | ICD-10-CM | POA: Diagnosis not present

## 2019-01-07 DIAGNOSIS — R55 Syncope and collapse: Secondary | ICD-10-CM | POA: Diagnosis not present

## 2019-01-07 DIAGNOSIS — R29810 Facial weakness: Secondary | ICD-10-CM | POA: Diagnosis not present

## 2019-01-07 DIAGNOSIS — I10 Essential (primary) hypertension: Secondary | ICD-10-CM | POA: Diagnosis not present

## 2019-01-07 DIAGNOSIS — I110 Hypertensive heart disease with heart failure: Secondary | ICD-10-CM

## 2019-01-07 DIAGNOSIS — H538 Other visual disturbances: Secondary | ICD-10-CM | POA: Diagnosis not present

## 2019-01-07 DIAGNOSIS — E1142 Type 2 diabetes mellitus with diabetic polyneuropathy: Secondary | ICD-10-CM | POA: Diagnosis not present

## 2019-01-07 DIAGNOSIS — R51 Headache: Secondary | ICD-10-CM | POA: Diagnosis not present

## 2019-01-07 DIAGNOSIS — A088 Other specified intestinal infections: Secondary | ICD-10-CM | POA: Diagnosis not present

## 2019-01-07 DIAGNOSIS — J449 Chronic obstructive pulmonary disease, unspecified: Secondary | ICD-10-CM

## 2019-01-07 DIAGNOSIS — A045 Campylobacter enteritis: Secondary | ICD-10-CM | POA: Diagnosis not present

## 2019-01-07 DIAGNOSIS — R200 Anesthesia of skin: Secondary | ICD-10-CM | POA: Diagnosis not present

## 2019-01-07 DIAGNOSIS — I951 Orthostatic hypotension: Secondary | ICD-10-CM | POA: Diagnosis not present

## 2019-01-07 DIAGNOSIS — R202 Paresthesia of skin: Secondary | ICD-10-CM | POA: Diagnosis not present

## 2019-01-07 DIAGNOSIS — R9431 Abnormal electrocardiogram [ECG] [EKG]: Secondary | ICD-10-CM | POA: Diagnosis not present

## 2019-01-07 DIAGNOSIS — K529 Noninfective gastroenteritis and colitis, unspecified: Secondary | ICD-10-CM | POA: Diagnosis not present

## 2019-01-07 DIAGNOSIS — I361 Nonrheumatic tricuspid (valve) insufficiency: Secondary | ICD-10-CM

## 2019-01-07 DIAGNOSIS — R42 Dizziness and giddiness: Secondary | ICD-10-CM

## 2019-01-07 DIAGNOSIS — J984 Other disorders of lung: Secondary | ICD-10-CM | POA: Diagnosis not present

## 2019-01-08 DIAGNOSIS — R55 Syncope and collapse: Secondary | ICD-10-CM | POA: Diagnosis not present

## 2019-01-08 DIAGNOSIS — M79661 Pain in right lower leg: Secondary | ICD-10-CM | POA: Diagnosis not present

## 2019-01-08 DIAGNOSIS — M7989 Other specified soft tissue disorders: Secondary | ICD-10-CM | POA: Diagnosis not present

## 2019-01-08 DIAGNOSIS — I6523 Occlusion and stenosis of bilateral carotid arteries: Secondary | ICD-10-CM | POA: Diagnosis not present

## 2019-01-08 DIAGNOSIS — I951 Orthostatic hypotension: Secondary | ICD-10-CM | POA: Diagnosis not present

## 2019-01-08 DIAGNOSIS — I10 Essential (primary) hypertension: Secondary | ICD-10-CM | POA: Diagnosis not present

## 2019-01-08 DIAGNOSIS — A088 Other specified intestinal infections: Secondary | ICD-10-CM | POA: Diagnosis not present

## 2019-01-08 DIAGNOSIS — M79662 Pain in left lower leg: Secondary | ICD-10-CM | POA: Diagnosis not present

## 2019-01-08 DIAGNOSIS — A045 Campylobacter enteritis: Secondary | ICD-10-CM | POA: Diagnosis not present

## 2019-01-08 DIAGNOSIS — R202 Paresthesia of skin: Secondary | ICD-10-CM | POA: Diagnosis not present

## 2019-01-08 DIAGNOSIS — K529 Noninfective gastroenteritis and colitis, unspecified: Secondary | ICD-10-CM | POA: Diagnosis not present

## 2019-01-08 DIAGNOSIS — R42 Dizziness and giddiness: Secondary | ICD-10-CM | POA: Diagnosis not present

## 2019-01-09 DIAGNOSIS — I10 Essential (primary) hypertension: Secondary | ICD-10-CM

## 2019-01-09 DIAGNOSIS — K529 Noninfective gastroenteritis and colitis, unspecified: Secondary | ICD-10-CM | POA: Diagnosis not present

## 2019-01-09 DIAGNOSIS — Z79899 Other long term (current) drug therapy: Secondary | ICD-10-CM | POA: Diagnosis not present

## 2019-01-09 DIAGNOSIS — H538 Other visual disturbances: Secondary | ICD-10-CM | POA: Diagnosis not present

## 2019-01-09 DIAGNOSIS — J984 Other disorders of lung: Secondary | ICD-10-CM | POA: Diagnosis not present

## 2019-01-09 DIAGNOSIS — R002 Palpitations: Secondary | ICD-10-CM | POA: Diagnosis not present

## 2019-01-09 DIAGNOSIS — N281 Cyst of kidney, acquired: Secondary | ICD-10-CM | POA: Diagnosis not present

## 2019-01-09 DIAGNOSIS — R262 Difficulty in walking, not elsewhere classified: Secondary | ICD-10-CM | POA: Diagnosis not present

## 2019-01-09 DIAGNOSIS — R51 Headache: Secondary | ICD-10-CM | POA: Diagnosis not present

## 2019-01-09 DIAGNOSIS — A088 Other specified intestinal infections: Secondary | ICD-10-CM | POA: Diagnosis not present

## 2019-01-09 DIAGNOSIS — Z85038 Personal history of other malignant neoplasm of large intestine: Secondary | ICD-10-CM | POA: Diagnosis not present

## 2019-01-09 DIAGNOSIS — I5033 Acute on chronic diastolic (congestive) heart failure: Secondary | ICD-10-CM | POA: Diagnosis not present

## 2019-01-09 DIAGNOSIS — E876 Hypokalemia: Secondary | ICD-10-CM | POA: Diagnosis not present

## 2019-01-09 DIAGNOSIS — A045 Campylobacter enteritis: Secondary | ICD-10-CM | POA: Diagnosis not present

## 2019-01-09 DIAGNOSIS — R202 Paresthesia of skin: Secondary | ICD-10-CM | POA: Diagnosis not present

## 2019-01-09 DIAGNOSIS — E785 Hyperlipidemia, unspecified: Secondary | ICD-10-CM | POA: Diagnosis not present

## 2019-01-09 DIAGNOSIS — H6693 Otitis media, unspecified, bilateral: Secondary | ICD-10-CM | POA: Diagnosis not present

## 2019-01-09 DIAGNOSIS — Z452 Encounter for adjustment and management of vascular access device: Secondary | ICD-10-CM | POA: Diagnosis not present

## 2019-01-09 DIAGNOSIS — D649 Anemia, unspecified: Secondary | ICD-10-CM | POA: Diagnosis not present

## 2019-01-09 DIAGNOSIS — J181 Lobar pneumonia, unspecified organism: Secondary | ICD-10-CM | POA: Diagnosis not present

## 2019-01-09 DIAGNOSIS — I110 Hypertensive heart disease with heart failure: Secondary | ICD-10-CM | POA: Diagnosis not present

## 2019-01-09 DIAGNOSIS — Z7982 Long term (current) use of aspirin: Secondary | ICD-10-CM | POA: Diagnosis not present

## 2019-01-09 DIAGNOSIS — J449 Chronic obstructive pulmonary disease, unspecified: Secondary | ICD-10-CM | POA: Diagnosis not present

## 2019-01-09 DIAGNOSIS — I951 Orthostatic hypotension: Secondary | ICD-10-CM

## 2019-01-09 DIAGNOSIS — E1142 Type 2 diabetes mellitus with diabetic polyneuropathy: Secondary | ICD-10-CM

## 2019-01-09 DIAGNOSIS — R55 Syncope and collapse: Secondary | ICD-10-CM

## 2019-01-09 DIAGNOSIS — R42 Dizziness and giddiness: Secondary | ICD-10-CM | POA: Diagnosis not present

## 2019-01-09 DIAGNOSIS — R200 Anesthesia of skin: Secondary | ICD-10-CM | POA: Diagnosis not present

## 2019-01-10 DIAGNOSIS — E1142 Type 2 diabetes mellitus with diabetic polyneuropathy: Secondary | ICD-10-CM | POA: Diagnosis not present

## 2019-01-10 DIAGNOSIS — I10 Essential (primary) hypertension: Secondary | ICD-10-CM | POA: Diagnosis not present

## 2019-01-10 DIAGNOSIS — K529 Noninfective gastroenteritis and colitis, unspecified: Secondary | ICD-10-CM | POA: Diagnosis not present

## 2019-01-10 DIAGNOSIS — R55 Syncope and collapse: Secondary | ICD-10-CM | POA: Diagnosis not present

## 2019-01-10 DIAGNOSIS — I951 Orthostatic hypotension: Secondary | ICD-10-CM | POA: Diagnosis not present

## 2019-01-10 DIAGNOSIS — R202 Paresthesia of skin: Secondary | ICD-10-CM | POA: Diagnosis not present

## 2019-01-10 DIAGNOSIS — A045 Campylobacter enteritis: Secondary | ICD-10-CM | POA: Diagnosis not present

## 2019-01-10 DIAGNOSIS — A088 Other specified intestinal infections: Secondary | ICD-10-CM | POA: Diagnosis not present

## 2019-01-11 DIAGNOSIS — A088 Other specified intestinal infections: Secondary | ICD-10-CM | POA: Diagnosis not present

## 2019-01-11 DIAGNOSIS — A045 Campylobacter enteritis: Secondary | ICD-10-CM | POA: Diagnosis not present

## 2019-01-11 DIAGNOSIS — I951 Orthostatic hypotension: Secondary | ICD-10-CM | POA: Diagnosis not present

## 2019-01-11 DIAGNOSIS — I10 Essential (primary) hypertension: Secondary | ICD-10-CM | POA: Diagnosis not present

## 2019-01-11 DIAGNOSIS — K529 Noninfective gastroenteritis and colitis, unspecified: Secondary | ICD-10-CM | POA: Diagnosis not present

## 2019-01-11 DIAGNOSIS — R202 Paresthesia of skin: Secondary | ICD-10-CM | POA: Diagnosis not present

## 2019-01-11 DIAGNOSIS — R55 Syncope and collapse: Secondary | ICD-10-CM | POA: Diagnosis not present

## 2019-01-11 DIAGNOSIS — E1142 Type 2 diabetes mellitus with diabetic polyneuropathy: Secondary | ICD-10-CM | POA: Diagnosis not present

## 2019-01-12 DIAGNOSIS — A088 Other specified intestinal infections: Secondary | ICD-10-CM | POA: Diagnosis not present

## 2019-01-12 DIAGNOSIS — A045 Campylobacter enteritis: Secondary | ICD-10-CM | POA: Diagnosis not present

## 2019-01-12 DIAGNOSIS — R202 Paresthesia of skin: Secondary | ICD-10-CM | POA: Diagnosis not present

## 2019-01-12 DIAGNOSIS — K529 Noninfective gastroenteritis and colitis, unspecified: Secondary | ICD-10-CM | POA: Diagnosis not present

## 2019-01-13 DIAGNOSIS — R202 Paresthesia of skin: Secondary | ICD-10-CM | POA: Diagnosis not present

## 2019-01-13 DIAGNOSIS — K529 Noninfective gastroenteritis and colitis, unspecified: Secondary | ICD-10-CM | POA: Diagnosis not present

## 2019-01-13 DIAGNOSIS — N281 Cyst of kidney, acquired: Secondary | ICD-10-CM | POA: Diagnosis not present

## 2019-01-13 DIAGNOSIS — A088 Other specified intestinal infections: Secondary | ICD-10-CM | POA: Diagnosis not present

## 2019-01-13 DIAGNOSIS — A045 Campylobacter enteritis: Secondary | ICD-10-CM | POA: Diagnosis not present

## 2019-01-14 DIAGNOSIS — A088 Other specified intestinal infections: Secondary | ICD-10-CM | POA: Diagnosis not present

## 2019-01-14 DIAGNOSIS — K529 Noninfective gastroenteritis and colitis, unspecified: Secondary | ICD-10-CM | POA: Diagnosis not present

## 2019-01-14 DIAGNOSIS — R202 Paresthesia of skin: Secondary | ICD-10-CM | POA: Diagnosis not present

## 2019-01-14 DIAGNOSIS — A045 Campylobacter enteritis: Secondary | ICD-10-CM | POA: Diagnosis not present

## 2019-01-15 DIAGNOSIS — A088 Other specified intestinal infections: Secondary | ICD-10-CM | POA: Diagnosis not present

## 2019-01-15 DIAGNOSIS — K529 Noninfective gastroenteritis and colitis, unspecified: Secondary | ICD-10-CM | POA: Diagnosis not present

## 2019-01-15 DIAGNOSIS — J181 Lobar pneumonia, unspecified organism: Secondary | ICD-10-CM | POA: Diagnosis not present

## 2019-01-15 DIAGNOSIS — I110 Hypertensive heart disease with heart failure: Secondary | ICD-10-CM

## 2019-01-15 DIAGNOSIS — J984 Other disorders of lung: Secondary | ICD-10-CM | POA: Diagnosis not present

## 2019-01-15 DIAGNOSIS — R202 Paresthesia of skin: Secondary | ICD-10-CM | POA: Diagnosis not present

## 2019-01-15 DIAGNOSIS — A045 Campylobacter enteritis: Secondary | ICD-10-CM | POA: Diagnosis not present

## 2019-01-16 DIAGNOSIS — K529 Noninfective gastroenteritis and colitis, unspecified: Secondary | ICD-10-CM | POA: Diagnosis not present

## 2019-01-16 DIAGNOSIS — R202 Paresthesia of skin: Secondary | ICD-10-CM | POA: Diagnosis not present

## 2019-01-16 DIAGNOSIS — A045 Campylobacter enteritis: Secondary | ICD-10-CM | POA: Diagnosis not present

## 2019-01-16 DIAGNOSIS — A088 Other specified intestinal infections: Secondary | ICD-10-CM | POA: Diagnosis not present

## 2019-01-18 DIAGNOSIS — Z85038 Personal history of other malignant neoplasm of large intestine: Secondary | ICD-10-CM | POA: Diagnosis not present

## 2019-01-18 DIAGNOSIS — Z7982 Long term (current) use of aspirin: Secondary | ICD-10-CM | POA: Diagnosis not present

## 2019-01-18 DIAGNOSIS — Z602 Problems related to living alone: Secondary | ICD-10-CM | POA: Diagnosis not present

## 2019-01-18 DIAGNOSIS — J449 Chronic obstructive pulmonary disease, unspecified: Secondary | ICD-10-CM | POA: Diagnosis not present

## 2019-01-18 DIAGNOSIS — I10 Essential (primary) hypertension: Secondary | ICD-10-CM | POA: Diagnosis not present

## 2019-01-18 DIAGNOSIS — R002 Palpitations: Secondary | ICD-10-CM | POA: Diagnosis not present

## 2019-01-18 DIAGNOSIS — H6693 Otitis media, unspecified, bilateral: Secondary | ICD-10-CM | POA: Diagnosis not present

## 2019-01-18 DIAGNOSIS — E1142 Type 2 diabetes mellitus with diabetic polyneuropathy: Secondary | ICD-10-CM | POA: Diagnosis not present

## 2019-01-18 DIAGNOSIS — R262 Difficulty in walking, not elsewhere classified: Secondary | ICD-10-CM | POA: Diagnosis not present

## 2019-01-18 DIAGNOSIS — Z79899 Other long term (current) drug therapy: Secondary | ICD-10-CM | POA: Diagnosis not present

## 2019-01-18 DIAGNOSIS — R918 Other nonspecific abnormal finding of lung field: Secondary | ICD-10-CM | POA: Diagnosis not present

## 2019-01-18 DIAGNOSIS — R0902 Hypoxemia: Secondary | ICD-10-CM | POA: Diagnosis not present

## 2019-01-18 DIAGNOSIS — Z87891 Personal history of nicotine dependence: Secondary | ICD-10-CM | POA: Diagnosis not present

## 2019-01-18 DIAGNOSIS — I491 Atrial premature depolarization: Secondary | ICD-10-CM | POA: Diagnosis not present

## 2019-01-18 DIAGNOSIS — S199XXA Unspecified injury of neck, initial encounter: Secondary | ICD-10-CM | POA: Diagnosis not present

## 2019-01-18 DIAGNOSIS — I5033 Acute on chronic diastolic (congestive) heart failure: Secondary | ICD-10-CM | POA: Diagnosis not present

## 2019-01-18 DIAGNOSIS — R0602 Shortness of breath: Secondary | ICD-10-CM | POA: Diagnosis not present

## 2019-01-18 DIAGNOSIS — R1084 Generalized abdominal pain: Secondary | ICD-10-CM | POA: Diagnosis not present

## 2019-01-18 DIAGNOSIS — R55 Syncope and collapse: Secondary | ICD-10-CM | POA: Diagnosis not present

## 2019-01-18 DIAGNOSIS — S0990XA Unspecified injury of head, initial encounter: Secondary | ICD-10-CM | POA: Diagnosis not present

## 2019-01-18 DIAGNOSIS — I951 Orthostatic hypotension: Secondary | ICD-10-CM | POA: Diagnosis not present

## 2019-01-18 DIAGNOSIS — E538 Deficiency of other specified B group vitamins: Secondary | ICD-10-CM | POA: Diagnosis not present

## 2019-01-18 DIAGNOSIS — I110 Hypertensive heart disease with heart failure: Secondary | ICD-10-CM | POA: Diagnosis not present

## 2019-01-18 DIAGNOSIS — H548 Legal blindness, as defined in USA: Secondary | ICD-10-CM | POA: Diagnosis not present

## 2019-01-18 DIAGNOSIS — E785 Hyperlipidemia, unspecified: Secondary | ICD-10-CM | POA: Diagnosis not present

## 2019-01-18 DIAGNOSIS — M1991 Primary osteoarthritis, unspecified site: Secondary | ICD-10-CM | POA: Diagnosis not present

## 2019-01-19 ENCOUNTER — Other Ambulatory Visit: Payer: Self-pay

## 2019-01-19 ENCOUNTER — Encounter: Payer: Self-pay | Admitting: Cardiology

## 2019-01-19 ENCOUNTER — Ambulatory Visit (INDEPENDENT_AMBULATORY_CARE_PROVIDER_SITE_OTHER): Payer: Medicare Other | Admitting: Cardiology

## 2019-01-19 VITALS — BP 162/88 | HR 85

## 2019-01-19 DIAGNOSIS — E1142 Type 2 diabetes mellitus with diabetic polyneuropathy: Secondary | ICD-10-CM | POA: Diagnosis not present

## 2019-01-19 DIAGNOSIS — E782 Mixed hyperlipidemia: Secondary | ICD-10-CM

## 2019-01-19 DIAGNOSIS — E538 Deficiency of other specified B group vitamins: Secondary | ICD-10-CM | POA: Diagnosis not present

## 2019-01-19 DIAGNOSIS — E785 Hyperlipidemia, unspecified: Secondary | ICD-10-CM

## 2019-01-19 DIAGNOSIS — E119 Type 2 diabetes mellitus without complications: Secondary | ICD-10-CM | POA: Diagnosis not present

## 2019-01-19 DIAGNOSIS — I10 Essential (primary) hypertension: Secondary | ICD-10-CM

## 2019-01-19 DIAGNOSIS — I951 Orthostatic hypotension: Secondary | ICD-10-CM | POA: Diagnosis not present

## 2019-01-19 DIAGNOSIS — M1991 Primary osteoarthritis, unspecified site: Secondary | ICD-10-CM | POA: Diagnosis not present

## 2019-01-19 DIAGNOSIS — I110 Hypertensive heart disease with heart failure: Secondary | ICD-10-CM | POA: Diagnosis not present

## 2019-01-19 DIAGNOSIS — K529 Noninfective gastroenteritis and colitis, unspecified: Secondary | ICD-10-CM | POA: Insufficient documentation

## 2019-01-19 DIAGNOSIS — R002 Palpitations: Secondary | ICD-10-CM | POA: Diagnosis not present

## 2019-01-19 DIAGNOSIS — F419 Anxiety disorder, unspecified: Secondary | ICD-10-CM | POA: Insufficient documentation

## 2019-01-19 DIAGNOSIS — Z87891 Personal history of nicotine dependence: Secondary | ICD-10-CM | POA: Diagnosis not present

## 2019-01-19 DIAGNOSIS — R262 Difficulty in walking, not elsewhere classified: Secondary | ICD-10-CM | POA: Diagnosis not present

## 2019-01-19 DIAGNOSIS — Z85038 Personal history of other malignant neoplasm of large intestine: Secondary | ICD-10-CM | POA: Diagnosis not present

## 2019-01-19 DIAGNOSIS — J449 Chronic obstructive pulmonary disease, unspecified: Secondary | ICD-10-CM | POA: Insufficient documentation

## 2019-01-19 DIAGNOSIS — R55 Syncope and collapse: Secondary | ICD-10-CM | POA: Diagnosis not present

## 2019-01-19 DIAGNOSIS — Z602 Problems related to living alone: Secondary | ICD-10-CM | POA: Diagnosis not present

## 2019-01-19 DIAGNOSIS — I5033 Acute on chronic diastolic (congestive) heart failure: Secondary | ICD-10-CM | POA: Diagnosis not present

## 2019-01-19 DIAGNOSIS — Z7982 Long term (current) use of aspirin: Secondary | ICD-10-CM | POA: Diagnosis not present

## 2019-01-19 DIAGNOSIS — C189 Malignant neoplasm of colon, unspecified: Secondary | ICD-10-CM | POA: Insufficient documentation

## 2019-01-19 DIAGNOSIS — H6693 Otitis media, unspecified, bilateral: Secondary | ICD-10-CM | POA: Diagnosis not present

## 2019-01-19 DIAGNOSIS — H548 Legal blindness, as defined in USA: Secondary | ICD-10-CM | POA: Diagnosis not present

## 2019-01-19 DIAGNOSIS — Z79899 Other long term (current) drug therapy: Secondary | ICD-10-CM | POA: Diagnosis not present

## 2019-01-19 HISTORY — DX: Type 2 diabetes mellitus without complications: E11.9

## 2019-01-19 HISTORY — DX: Hyperlipidemia, unspecified: E78.5

## 2019-01-19 NOTE — Patient Instructions (Signed)
Medication Instructions:  Your physician has recommended you make the following change in your medication:   STOP: Furosemide (Lasix)  HOLD: MIdodrine if SBP (top number) is greater than 120  If you need a refill on your cardiac medications before your next appointment, please call your pharmacy.   Lab work: Your physician recommends that you return for lab work in: 1 WEEK BMP,MAgnesium  If you have labs (blood work) drawn today and your tests are completely normal, you will receive your results only by: Marland Kitchen MyChart Message (if you have MyChart) OR . A paper copy in the mail If you have any lab test that is abnormal or we need to change your treatment, we will call you to review the results.  Testing/Procedures: Your physician has recommended that you wear a ZIO monitor. ZIO monitors are medical devices that record the heart's electrical activity. Doctors most often use these monitors to diagnose arrhythmias. Arrhythmias are problems with the speed or rhythm of the heartbeat. The monitor is a small, portable device. You can wear one while you do your normal daily activities. This is usually used to diagnose what is causing palpitations/syncope (passing out).  WEAR 14 DAYS    Follow-Up: At [facility], you and your health needs are our priority.  As part of our continuing mission to provide you with exceptional heart care, we have created designated Provider Care Teams.  These Care Teams include your primary Cardiologist (physician) and Advanced Practice Providers (APPs -  Physician Assistants and Nurse Practitioners) who all work together to provide you with the care you need, when you need it. You will need a follow up appointment in 2 weeks with Dr. Harriet Masson Any Other Special Instructions Will Be Listed Below (If Applicable).

## 2019-01-19 NOTE — Progress Notes (Signed)
Cardiology Office Note:    Date:  01/19/2019   ID:  Heather Mccoy, DOB 1945-06-23, MRN [id number]  PCP:  Nicholos Johns, MD  Cardiologist:  Berniece Salines, DO  Electrophysiologist:  None   Referring MD: Nicholos Johns, MD   Chief complaint: Recent ED Visit for syncope.   History of Present Illness:    Heather Mccoy is a 73 y.o. female with a hx of diabetes mellitus, colon cancer status post surgery, chronic diarrhea, anxiety, COPD not on oxygen, tobacco use, hypertension and hyperlipidemia.  She presents today for a evaluation after a brief emergency room visit yesterday due to syncope.  She tells me that she was trying to stand up from a sitting position.  She notes that this time for the first time she did have a prodrome where she could visually see dark dots right prior to her passing out.  She also reported that she felt the room was spinning at the time.  On presentation to the ED she and her friend who is with her today reported that her blood pressure was 190/80.  Lab work was done and she was discharged to home for cardiology follow-up. Of note the patient was recently hospitalized at [facility] in August where she was treated for syncope due to orthostatic hypotension.  Her medications were optimized which included stopping her diuretics and she was started on midodrine and Florinef.  She reports compliance with her medications. Here in the office she is not dizzy, she does not have any chest pain, she denies any shortness of breath.  Past Medical History:  Diagnosis Date  . Anxiety   . Arthritis   . Cancer ([place])    colon  . COPD (chronic obstructive pulmonary disease) ([place])   . Diabetes mellitus   . Dysrhythmia   . Hyperlipidemia   . Hypertension   . Mood disorder ([place])   . Orthostatic hypotension   . Shortness of breath     Past Surgical History:  Procedure Laterality Date  . APPENDECTOMY    . CHOLECYSTECTOMY    . COLON SURGERY    . EUS  01/12/2012   Procedure:  UPPER ENDOSCOPIC ULTRASOUND (EUS) RADIAL;  Surgeon: Arta Silence, MD;  Location: [facility] ENDOSCOPY;  Service: Endoscopy;  Laterality: N/A;  to be admitted after  . REPLACEMENT TOTAL KNEE      Current Medications: Current Meds  Medication Sig  . Albuterol Sulfate 2.5 MG/0.5ML NEBU Inhale 2.5 mg into the lungs 3 (three) times daily as needed.  Marland Kitchen alendronate (FOSAMAX) 70 MG tablet Take 70 mg by mouth once a week. Take with a full glass of water on an empty stomach.  Marland Kitchen aspirin EC 81 MG tablet Take 81 mg by mouth daily.  Marland Kitchen atorvastatin (LIPITOR) 40 MG tablet Take 40 mg by mouth daily.  . citalopram (CELEXA) 10 MG tablet Take 10 mg by mouth daily.  . diclofenac sodium (VOLTAREN) 1 % GEL Apply 2 g topically 4 (four) times daily.  Marland Kitchen dicyclomine (BENTYL) 20 MG tablet Take 20 mg by mouth 3 (three) times daily before meals.  . Eluxadoline (VIBERZI) 75 MG TABS Take 75 mg by mouth 2 (two) times daily.  . famotidine (PEPCID) 40 MG tablet Take 40 mg by mouth 2 (two) times daily.  . fludrocortisone (FLORINEF) 0.1 MG tablet Take 0.1 mg by mouth daily.  Marland Kitchen loperamide (IMODIUM) 2 MG capsule Take 2 mg by mouth every 6 (six) hours as needed for diarrhea or loose stools.  Marland Kitchen  midodrine (PROAMATINE) 5 MG tablet Take 5 mg by mouth 3 (three) times daily with meals.  . montelukast (SINGULAIR) 10 MG tablet Take 10 mg by mouth at bedtime.  . pantoprazole (PROTONIX) 40 MG tablet Take 40 mg by mouth daily.  Marland Kitchen umeclidinium-vilanterol (ANORO ELLIPTA) 62.5-25 MCG/INH AEPB Inhale 1 puff into the lungs daily.  . [DISCONTINUED] dicyclomine (BENTYL) 10 MG capsule Take 10 mg by mouth 4 (four) times daily -  before meals and at bedtime.  . [DISCONTINUED] furosemide (LASIX) 20 MG tablet Take 20 mg by mouth daily.     Allergies:   Codeine and Tylenol [acetaminophen]   Social History   Socioeconomic History  . Marital status: Widowed    Spouse name: Not on file  . Number of children: Not on file  . Years of education: Not on file   . Highest education level: Not on file  Occupational History  . Not on file  Social Needs  . Financial resource strain: Not on file  . Food insecurity    Worry: Not on file    Inability: Not on file  . Transportation needs    Medical: Not on file    Non-medical: Not on file  Tobacco Use  . Smoking status: Current Every Day Smoker    Packs/day: 0.50  . Smokeless tobacco: Never Used  Substance and Sexual Activity  . Alcohol use: No  . Drug use: No  . Sexual activity: Never  Lifestyle  . Physical activity    Days per week: Not on file    Minutes per session: Not on file  . Stress: Not on file  Relationships  . Social Herbalist on phone: Not on file    Gets together: Not on file    Attends religious service: Not on file    Active member of club or organization: Not on file    Attends meetings of clubs or organizations: Not on file    Relationship status: Not on file  Other Topics Concern  . Not on file  Social History Narrative  . Not on file     Family History: The patient's family history includes CVA in her father; Cerebral aneurysm in her mother; Heart attack in her father and sister.  ROS:   Please see the history of present illness.   Review of Systems  Constitution: Negative for fever, malaise/fatigue and weight loss.  HENT: Positive for hearing loss (left ear). Negative for congestion and sinus pain.   Cardiovascular: Negative for chest pain, leg swelling and palpitations.  Respiratory: Positive for shortness of breath. Negative for cough and wheezing.   Musculoskeletal: Negative for back pain, joint pain and myalgias.  Gastrointestinal: Positive for diarrhea (chronic diarrhea). Negative for heartburn and nausea.  Genitourinary: Negative for frequency and hematuria.  Neurological: Positive for dizziness. Negative for headaches and weakness.  Psychiatric/Behavioral: Positive for depression. Negative for substance abuse. The patient is  nervous/anxious.    Review of Systems  Constitutional: Negative for fever, malaise/fatigue and weight loss.  HENT: Positive for hearing loss (left ear). Negative for congestion and sinus pain.   Respiratory: Positive for shortness of breath. Negative for cough and wheezing.   Cardiovascular: Negative for chest pain, palpitations and leg swelling.  Gastrointestinal: Positive for diarrhea (chronic diarrhea). Negative for heartburn and nausea.  Genitourinary: Negative for frequency and hematuria.  Musculoskeletal: Negative for back pain, joint pain and myalgias.  Neurological: Positive for dizziness. Negative for weakness and headaches.  Psychiatric/Behavioral: Positive  for depression. Negative for substance abuse. The patient is nervous/anxious.       EKGs/Labs/Other Studies Reviewed:    The following studies were reviewed today: Transthoracic echocardiogram performed in August 2020 report overall normal left ventricular systolic function with an EF of 65 to 70%.  Left atrium was severely dilated.  There was evidence of mild aortic valve sclerosis without stenosis.  Bilateral lower extremity venous Doppler ultrasound performed August 2020 reported no femoral-popliteal dvt in the visualized calf veins.  CT of the head without contrast August 2020 Reported no acute intracranial abnormalities.  Mild chronic small vessel ischemic disease.  Myocardial perfusion study performed March 2019 reported no evidence of reversible ischemia or infarction.  Normal left ventricle wall motion.  LVEF was 69.  EKG:  EKG is  ordered today.  The ekg ordered today demonstrates evidence of sinus rhythm with a heart rate of 81 bpm with arrhythmia, poor progression in the precordial leads which could be suggestive of old anterior infarction, QS pattern in the inferior leads which also could be suggestive of old anterior wall infarction.  QT prolongation with nonspecific ST changes.  Recent Labs: No results found  for requested labs within last 8760 hours.  Recent Lipid Panel No results found for: CHOL, TRIG, HDL, CHOLHDL, VLDL, LDLCALC, LDLDIRECT  Physical Exam:    VS:  BP (!) 162/88 (BP Location: Right Arm, Patient Position: Sitting)   Pulse 85   SpO2 97%     Wt Readings from Last 3 Encounters:  01/12/12 125 lb (56.7 kg)     GEN:  Well nourished, well developed in no acute distress HEENT: Normal NECK: No JVD; No carotid bruits LYMPHATICS: No lymphadenopathy CARDIAC: S1S2 noted, RRR, evidence of sclerosis, no defined systolic murmur, rubs, gallops RESPIRATORY:  Clear to auscultation without rales, wheezing or rhonchi  ABDOMEN: Soft, non-tender, non-distended EXTREMITIES: no edema, no cyanosis, no clubbing. MUSCULOSKELETAL:  No edema; No deformity  SKIN: Warm and dry NEUROLOGIC:  Alert and oriented x 3 PSYCHIATRIC:  Normal affect   ASSESSMENT:    1. Syncope and collapse   2. Essential hypertension   3. Type 2 diabetes mellitus without complication, without long-term current use of insulin ([place])   4. Mixed hyperlipidemia   5. Anxiety   6. Malignant neoplasm of colon, unspecified part of colon ([place])   7. Chronic diarrhea    PLAN:    Mrs. Bonnet was in the office today after an ED visit yesterday due to syncope. Her recent hospital visit shows evidence of orthostatic hypotension. She was started on Midodrine 5mg  TID as well as fludrocortisone 0.1mg  daily. She has been complaint with her medications. She again experienced a syncope episode as described in her HPI. I did her blood pressure during our visit and there was not evidence of profound orthostasis ( standing 154/86 and sitting 142/43mmHg) she did admit to a bit of dizziness while standing.   For now I do recommend that patient wear a 14 day monitor. In addition given the slightly elevated blood pressure she was advised to take her blood pressure prior to taking any of her midodrine doses and if her systolic blood pressure is  above 120 to hold the dose until again rechecking at the time of next dose.   She will follow up in 2 weeks. The patient is in agreement with the above plan.     Medication Adjustments/Labs and Tests Ordered: Current medicines are reviewed at length with the patient today.  Concerns regarding medicines  are outlined above.  Orders Placed This Encounter  Procedures  . Basic Metabolic Panel (BMET)  . Magnesium  . LONG TERM MONITOR (3-14 DAYS)  . EKG 12-Lead   No orders of the defined types were placed in this encounter.   Patient Instructions  Medication Instructions:  Your physician has recommended you make the following change in your medication:   STOP: Furosemide (Lasix)  HOLD: MIdodrine if SBP (top number) is greater than 120  If you need a refill on your cardiac medications before your next appointment, please call your pharmacy.   Lab work: Your physician recommends that you return for lab work in: 1 WEEK BMP,MAgnesium  If you have labs (blood work) drawn today and your tests are completely normal, you will receive your results only by: Marland Kitchen MyChart Message (if you have MyChart) OR . A paper copy in the mail If you have any lab test that is abnormal or we need to change your treatment, we will call you to review the results.  Testing/Procedures: Your physician has recommended that you wear a ZIO monitor. ZIO monitors are medical devices that record the heart's electrical activity. Doctors most often use these monitors to diagnose arrhythmias. Arrhythmias are problems with the speed or rhythm of the heartbeat. The monitor is a small, portable device. You can wear one while you do your normal daily activities. This is usually used to diagnose what is causing palpitations/syncope (passing out).  WEAR 14 DAYS    Follow-Up: At [facility], you and your health needs are our priority.  As part of our continuing mission to provide you with exceptional heart care, we have  created designated Provider Care Teams.  These Care Teams include your primary Cardiologist (physician) and Advanced Practice Providers (APPs -  Physician Assistants and Nurse Practitioners) who all work together to provide you with the care you need, when you need it. You will need a follow up appointment in 2 weeks with Dr. Harriet Masson Any Other Special Instructions Will Be Listed Below (If Applicable).       Rolly Pancake, DO  01/19/2019 7:06 PM    [facility]

## 2019-01-23 DIAGNOSIS — J449 Chronic obstructive pulmonary disease, unspecified: Secondary | ICD-10-CM | POA: Diagnosis not present

## 2019-01-23 DIAGNOSIS — Z79899 Other long term (current) drug therapy: Secondary | ICD-10-CM | POA: Diagnosis not present

## 2019-01-23 DIAGNOSIS — E1142 Type 2 diabetes mellitus with diabetic polyneuropathy: Secondary | ICD-10-CM | POA: Diagnosis not present

## 2019-01-23 DIAGNOSIS — Z87891 Personal history of nicotine dependence: Secondary | ICD-10-CM | POA: Diagnosis not present

## 2019-01-23 DIAGNOSIS — H548 Legal blindness, as defined in USA: Secondary | ICD-10-CM | POA: Diagnosis not present

## 2019-01-23 DIAGNOSIS — E785 Hyperlipidemia, unspecified: Secondary | ICD-10-CM | POA: Diagnosis not present

## 2019-01-23 DIAGNOSIS — H6693 Otitis media, unspecified, bilateral: Secondary | ICD-10-CM | POA: Diagnosis not present

## 2019-01-23 DIAGNOSIS — R262 Difficulty in walking, not elsewhere classified: Secondary | ICD-10-CM | POA: Diagnosis not present

## 2019-01-23 DIAGNOSIS — R002 Palpitations: Secondary | ICD-10-CM | POA: Diagnosis not present

## 2019-01-23 DIAGNOSIS — M1991 Primary osteoarthritis, unspecified site: Secondary | ICD-10-CM | POA: Diagnosis not present

## 2019-01-23 DIAGNOSIS — E538 Deficiency of other specified B group vitamins: Secondary | ICD-10-CM | POA: Diagnosis not present

## 2019-01-23 DIAGNOSIS — I951 Orthostatic hypotension: Secondary | ICD-10-CM | POA: Diagnosis not present

## 2019-01-23 DIAGNOSIS — I110 Hypertensive heart disease with heart failure: Secondary | ICD-10-CM | POA: Diagnosis not present

## 2019-01-23 DIAGNOSIS — I5033 Acute on chronic diastolic (congestive) heart failure: Secondary | ICD-10-CM | POA: Diagnosis not present

## 2019-01-23 DIAGNOSIS — Z602 Problems related to living alone: Secondary | ICD-10-CM | POA: Diagnosis not present

## 2019-01-23 DIAGNOSIS — Z7982 Long term (current) use of aspirin: Secondary | ICD-10-CM | POA: Diagnosis not present

## 2019-01-23 DIAGNOSIS — Z85038 Personal history of other malignant neoplasm of large intestine: Secondary | ICD-10-CM | POA: Diagnosis not present

## 2019-01-24 DIAGNOSIS — Z79899 Other long term (current) drug therapy: Secondary | ICD-10-CM | POA: Diagnosis not present

## 2019-01-24 DIAGNOSIS — E785 Hyperlipidemia, unspecified: Secondary | ICD-10-CM | POA: Diagnosis not present

## 2019-01-24 DIAGNOSIS — Z87891 Personal history of nicotine dependence: Secondary | ICD-10-CM | POA: Diagnosis not present

## 2019-01-24 DIAGNOSIS — R262 Difficulty in walking, not elsewhere classified: Secondary | ICD-10-CM | POA: Diagnosis not present

## 2019-01-24 DIAGNOSIS — I110 Hypertensive heart disease with heart failure: Secondary | ICD-10-CM | POA: Diagnosis not present

## 2019-01-24 DIAGNOSIS — Z602 Problems related to living alone: Secondary | ICD-10-CM | POA: Diagnosis not present

## 2019-01-24 DIAGNOSIS — Z23 Encounter for immunization: Secondary | ICD-10-CM | POA: Diagnosis not present

## 2019-01-24 DIAGNOSIS — E1142 Type 2 diabetes mellitus with diabetic polyneuropathy: Secondary | ICD-10-CM | POA: Diagnosis not present

## 2019-01-24 DIAGNOSIS — I5033 Acute on chronic diastolic (congestive) heart failure: Secondary | ICD-10-CM | POA: Diagnosis not present

## 2019-01-24 DIAGNOSIS — J449 Chronic obstructive pulmonary disease, unspecified: Secondary | ICD-10-CM | POA: Diagnosis not present

## 2019-01-24 DIAGNOSIS — H6693 Otitis media, unspecified, bilateral: Secondary | ICD-10-CM | POA: Diagnosis not present

## 2019-01-24 DIAGNOSIS — R002 Palpitations: Secondary | ICD-10-CM | POA: Diagnosis not present

## 2019-01-24 DIAGNOSIS — Z7982 Long term (current) use of aspirin: Secondary | ICD-10-CM | POA: Diagnosis not present

## 2019-01-24 DIAGNOSIS — E538 Deficiency of other specified B group vitamins: Secondary | ICD-10-CM | POA: Diagnosis not present

## 2019-01-24 DIAGNOSIS — H548 Legal blindness, as defined in USA: Secondary | ICD-10-CM | POA: Diagnosis not present

## 2019-01-24 DIAGNOSIS — I951 Orthostatic hypotension: Secondary | ICD-10-CM | POA: Diagnosis not present

## 2019-01-24 DIAGNOSIS — R55 Syncope and collapse: Secondary | ICD-10-CM | POA: Diagnosis not present

## 2019-01-24 DIAGNOSIS — M1991 Primary osteoarthritis, unspecified site: Secondary | ICD-10-CM | POA: Diagnosis not present

## 2019-01-24 DIAGNOSIS — Z85038 Personal history of other malignant neoplasm of large intestine: Secondary | ICD-10-CM | POA: Diagnosis not present

## 2019-01-25 DIAGNOSIS — I110 Hypertensive heart disease with heart failure: Secondary | ICD-10-CM | POA: Diagnosis not present

## 2019-01-25 DIAGNOSIS — E538 Deficiency of other specified B group vitamins: Secondary | ICD-10-CM | POA: Diagnosis not present

## 2019-01-25 DIAGNOSIS — H548 Legal blindness, as defined in USA: Secondary | ICD-10-CM | POA: Diagnosis not present

## 2019-01-25 DIAGNOSIS — R262 Difficulty in walking, not elsewhere classified: Secondary | ICD-10-CM | POA: Diagnosis not present

## 2019-01-25 DIAGNOSIS — M1991 Primary osteoarthritis, unspecified site: Secondary | ICD-10-CM | POA: Diagnosis not present

## 2019-01-25 DIAGNOSIS — I5033 Acute on chronic diastolic (congestive) heart failure: Secondary | ICD-10-CM | POA: Diagnosis not present

## 2019-01-25 DIAGNOSIS — J449 Chronic obstructive pulmonary disease, unspecified: Secondary | ICD-10-CM | POA: Diagnosis not present

## 2019-01-25 DIAGNOSIS — E785 Hyperlipidemia, unspecified: Secondary | ICD-10-CM | POA: Diagnosis not present

## 2019-01-25 DIAGNOSIS — I951 Orthostatic hypotension: Secondary | ICD-10-CM | POA: Diagnosis not present

## 2019-01-25 DIAGNOSIS — E1142 Type 2 diabetes mellitus with diabetic polyneuropathy: Secondary | ICD-10-CM | POA: Diagnosis not present

## 2019-01-25 DIAGNOSIS — Z79899 Other long term (current) drug therapy: Secondary | ICD-10-CM | POA: Diagnosis not present

## 2019-01-25 DIAGNOSIS — R002 Palpitations: Secondary | ICD-10-CM | POA: Diagnosis not present

## 2019-01-25 DIAGNOSIS — Z602 Problems related to living alone: Secondary | ICD-10-CM | POA: Diagnosis not present

## 2019-01-25 DIAGNOSIS — Z87891 Personal history of nicotine dependence: Secondary | ICD-10-CM | POA: Diagnosis not present

## 2019-01-25 DIAGNOSIS — Z85038 Personal history of other malignant neoplasm of large intestine: Secondary | ICD-10-CM | POA: Diagnosis not present

## 2019-01-25 DIAGNOSIS — H6693 Otitis media, unspecified, bilateral: Secondary | ICD-10-CM | POA: Diagnosis not present

## 2019-01-25 DIAGNOSIS — Z7982 Long term (current) use of aspirin: Secondary | ICD-10-CM | POA: Diagnosis not present

## 2019-01-26 ENCOUNTER — Ambulatory Visit (INDEPENDENT_AMBULATORY_CARE_PROVIDER_SITE_OTHER): Payer: Medicare Other

## 2019-01-26 DIAGNOSIS — R55 Syncope and collapse: Secondary | ICD-10-CM | POA: Diagnosis not present

## 2019-01-27 LAB — BASIC METABOLIC PANEL
BUN/Creatinine Ratio: 24 (ref 12–28)
BUN: 17 mg/dL (ref 8–27)
CO2: 22 mmol/L (ref 20–29)
Calcium: 9 mg/dL (ref 8.7–10.3)
Chloride: 104 mmol/L (ref 96–106)
Creatinine, Ser: 0.71 mg/dL (ref 0.57–1.00)
GFR calc Af Amer: 98 mL/min/{1.73_m2} (ref >59)
GFR calc non Af Amer: 85 mL/min/{1.73_m2} (ref >59)
Glucose: 92 mg/dL (ref 65–99)
Potassium: 4.8 mmol/L (ref 3.5–5.2)
Sodium: 137 mmol/L (ref 134–144)

## 2019-01-27 LAB — MAGNESIUM: Magnesium: 1.8 mg/dL (ref 1.6–2.3)

## 2019-01-29 DIAGNOSIS — E1142 Type 2 diabetes mellitus with diabetic polyneuropathy: Secondary | ICD-10-CM | POA: Diagnosis not present

## 2019-01-29 DIAGNOSIS — Z602 Problems related to living alone: Secondary | ICD-10-CM | POA: Diagnosis not present

## 2019-01-29 DIAGNOSIS — Z79899 Other long term (current) drug therapy: Secondary | ICD-10-CM | POA: Diagnosis not present

## 2019-01-29 DIAGNOSIS — R262 Difficulty in walking, not elsewhere classified: Secondary | ICD-10-CM | POA: Diagnosis not present

## 2019-01-29 DIAGNOSIS — R002 Palpitations: Secondary | ICD-10-CM | POA: Diagnosis not present

## 2019-01-29 DIAGNOSIS — E538 Deficiency of other specified B group vitamins: Secondary | ICD-10-CM | POA: Diagnosis not present

## 2019-01-29 DIAGNOSIS — Z7982 Long term (current) use of aspirin: Secondary | ICD-10-CM | POA: Diagnosis not present

## 2019-01-29 DIAGNOSIS — M1991 Primary osteoarthritis, unspecified site: Secondary | ICD-10-CM | POA: Diagnosis not present

## 2019-01-29 DIAGNOSIS — H6693 Otitis media, unspecified, bilateral: Secondary | ICD-10-CM | POA: Diagnosis not present

## 2019-01-29 DIAGNOSIS — E785 Hyperlipidemia, unspecified: Secondary | ICD-10-CM | POA: Diagnosis not present

## 2019-01-29 DIAGNOSIS — I110 Hypertensive heart disease with heart failure: Secondary | ICD-10-CM | POA: Diagnosis not present

## 2019-01-29 DIAGNOSIS — H548 Legal blindness, as defined in USA: Secondary | ICD-10-CM | POA: Diagnosis not present

## 2019-01-29 DIAGNOSIS — Z85038 Personal history of other malignant neoplasm of large intestine: Secondary | ICD-10-CM | POA: Diagnosis not present

## 2019-01-29 DIAGNOSIS — I5033 Acute on chronic diastolic (congestive) heart failure: Secondary | ICD-10-CM | POA: Diagnosis not present

## 2019-01-29 DIAGNOSIS — J449 Chronic obstructive pulmonary disease, unspecified: Secondary | ICD-10-CM | POA: Diagnosis not present

## 2019-01-29 DIAGNOSIS — I951 Orthostatic hypotension: Secondary | ICD-10-CM | POA: Diagnosis not present

## 2019-01-29 DIAGNOSIS — Z87891 Personal history of nicotine dependence: Secondary | ICD-10-CM | POA: Diagnosis not present

## 2019-01-31 DIAGNOSIS — J449 Chronic obstructive pulmonary disease, unspecified: Secondary | ICD-10-CM | POA: Diagnosis not present

## 2019-01-31 DIAGNOSIS — I951 Orthostatic hypotension: Secondary | ICD-10-CM | POA: Diagnosis not present

## 2019-01-31 DIAGNOSIS — Z87891 Personal history of nicotine dependence: Secondary | ICD-10-CM | POA: Diagnosis not present

## 2019-01-31 DIAGNOSIS — E538 Deficiency of other specified B group vitamins: Secondary | ICD-10-CM | POA: Diagnosis not present

## 2019-01-31 DIAGNOSIS — Z85038 Personal history of other malignant neoplasm of large intestine: Secondary | ICD-10-CM | POA: Diagnosis not present

## 2019-01-31 DIAGNOSIS — R002 Palpitations: Secondary | ICD-10-CM | POA: Diagnosis not present

## 2019-01-31 DIAGNOSIS — H6693 Otitis media, unspecified, bilateral: Secondary | ICD-10-CM | POA: Diagnosis not present

## 2019-01-31 DIAGNOSIS — I5033 Acute on chronic diastolic (congestive) heart failure: Secondary | ICD-10-CM | POA: Diagnosis not present

## 2019-01-31 DIAGNOSIS — Z602 Problems related to living alone: Secondary | ICD-10-CM | POA: Diagnosis not present

## 2019-01-31 DIAGNOSIS — M1991 Primary osteoarthritis, unspecified site: Secondary | ICD-10-CM | POA: Diagnosis not present

## 2019-01-31 DIAGNOSIS — R262 Difficulty in walking, not elsewhere classified: Secondary | ICD-10-CM | POA: Diagnosis not present

## 2019-01-31 DIAGNOSIS — E785 Hyperlipidemia, unspecified: Secondary | ICD-10-CM | POA: Diagnosis not present

## 2019-01-31 DIAGNOSIS — E1142 Type 2 diabetes mellitus with diabetic polyneuropathy: Secondary | ICD-10-CM | POA: Diagnosis not present

## 2019-01-31 DIAGNOSIS — I110 Hypertensive heart disease with heart failure: Secondary | ICD-10-CM | POA: Diagnosis not present

## 2019-01-31 DIAGNOSIS — Z7982 Long term (current) use of aspirin: Secondary | ICD-10-CM | POA: Diagnosis not present

## 2019-01-31 DIAGNOSIS — H548 Legal blindness, as defined in USA: Secondary | ICD-10-CM | POA: Diagnosis not present

## 2019-01-31 DIAGNOSIS — Z79899 Other long term (current) drug therapy: Secondary | ICD-10-CM | POA: Diagnosis not present

## 2019-02-05 ENCOUNTER — Other Ambulatory Visit: Payer: Self-pay

## 2019-02-05 ENCOUNTER — Encounter: Payer: Self-pay | Admitting: Cardiology

## 2019-02-05 ENCOUNTER — Ambulatory Visit (INDEPENDENT_AMBULATORY_CARE_PROVIDER_SITE_OTHER): Payer: Medicare Other | Admitting: Cardiology

## 2019-02-05 VITALS — BP 186/100 | HR 73 | Ht 62.0 in | Wt 132.6 lb

## 2019-02-05 DIAGNOSIS — I10 Essential (primary) hypertension: Secondary | ICD-10-CM

## 2019-02-05 DIAGNOSIS — R0789 Other chest pain: Secondary | ICD-10-CM | POA: Diagnosis not present

## 2019-02-05 DIAGNOSIS — R079 Chest pain, unspecified: Secondary | ICD-10-CM

## 2019-02-05 DIAGNOSIS — R55 Syncope and collapse: Secondary | ICD-10-CM | POA: Diagnosis not present

## 2019-02-05 DIAGNOSIS — I491 Atrial premature depolarization: Secondary | ICD-10-CM | POA: Diagnosis not present

## 2019-02-05 MED ORDER — NITROGLYCERIN 0.4 MG SL SUBL: 0.4000 mg | SUBLINGUAL_TABLET | SUBLINGUAL | 5 refills | Status: DC | PRN

## 2019-02-05 NOTE — Progress Notes (Signed)
Cardiology Office Note:    Date:  02/06/2019   ID:  Heather Mccoy, DOB [birth year], MRN [id number]  PCP:  Rochel Brome, MD  Cardiologist:  Berniece Salines, DO  Electrophysiologist:  None   Referring MD: Nicholos Johns, MD   Follow-up visit  History of Present Illness:    Heather Mccoy is a 73 y.o. female with a hx of diabetes mellitus, colon cancer status post surgery, chronic diarrhea, anxiety, COPD not on oxygen, tobacco use, hypertension and hyperlipidemia presents for follow-up visit.  I last saw patient in January 19, 2019 at which time she was posthospitalization.  She was hospitalized at [facility] in August where she was treated for syncope due to orthostatic hypotension.  Her medications were optimized which included stopping her diuretics and she was started on midodrine and Florinef.  On completion of our visit given her elevated blood pressure I did advise patient to only take her midodrine 5 mg 3 times daily if her systolic blood pressure was less than 120.  Since her visit she has not needed to take the midodrine.  She was also given a ZIO patch which she is still wearing to complete a 14-day monitoring.  She was continued on the fludrocortisone 0.1 mg daily.  Today she tells me she has not had any syncope episodes since her last visit.  She notes that she has had intermittent burning chest pain.  She states today when she came out of the car coming to the office she felt this similar burning sensation in his epigastric and by the time she came into the office and has settled it resolved.  She denies any shortness of breath.  Past Medical History:  Diagnosis Date   Anxiety    Arthritis    Cancer ([place])    colon   COPD (chronic obstructive pulmonary disease) (HCC)    Diabetes mellitus    Dysrhythmia    Hyperlipidemia    Hypertension    Mood disorder (HCC)    Orthostatic hypotension    Shortness of breath     Past Surgical History:  Procedure Laterality  Date   APPENDECTOMY     CHOLECYSTECTOMY     COLON SURGERY     EUS  01/12/2012   Procedure: UPPER ENDOSCOPIC ULTRASOUND (EUS) RADIAL;  Surgeon: Arta Silence, MD;  Location: [facility];  Service: Endoscopy;  Laterality: N/A;  to be admitted after   REPLACEMENT TOTAL KNEE      Current Medications: Current Meds  Medication Sig   Albuterol Sulfate 2.5 MG/0.5ML NEBU Inhale 2.5 mg into the lungs 3 (three) times daily as needed.   alendronate (FOSAMAX) 70 MG tablet Take 70 mg by mouth once a week. Take with a full glass of water on an empty stomach.   aspirin EC 81 MG tablet Take 81 mg by mouth daily.   atorvastatin (LIPITOR) 40 MG tablet Take 40 mg by mouth daily.   citalopram (CELEXA) 10 MG tablet Take 10 mg by mouth daily.   diclofenac sodium (VOLTAREN) 1 % GEL Apply 2 g topically 4 (four) times daily.   dicyclomine (BENTYL) 10 MG capsule Take 1 capsule by mouth 4 (four) times daily as needed.   diphenoxylate-atropine (LOMOTIL) 2.5-0.025 MG tablet Take 1 tablet by mouth 4 (four) times daily as needed for diarrhea or loose stools.   famotidine (PEPCID) 40 MG tablet Take 40 mg by mouth 2 (two) times daily.   fludrocortisone (FLORINEF) 0.1 MG tablet Take 0.1 mg by mouth  every other day.   hydrOXYzine (ATARAX/VISTARIL) 25 MG tablet Take 25 mg by mouth 3 (three) times daily as needed.   loperamide (IMODIUM) 2 MG capsule Take 2 mg by mouth every 6 (six) hours as needed for diarrhea or loose stools.   LORazepam (ATIVAN) 0.5 MG tablet Take 1 mg by mouth daily as needed.    meclizine (ANTIVERT) 25 MG tablet Take 25 mg by mouth every 6 (six) hours as needed for dizziness.   midodrine (PROAMATINE) 5 MG tablet Take 5 mg by mouth 3 (three) times daily with meals.   montelukast (SINGULAIR) 10 MG tablet Take 10 mg by mouth at bedtime.   pantoprazole (PROTONIX) 40 MG tablet Take 40 mg by mouth daily.   promethazine (PHENERGAN) 25 MG tablet Take 1 tablet by mouth 3 (three) times  daily as needed.   traMADol (ULTRAM) 50 MG tablet Take 50 mg by mouth every 6 (six) hours as needed.   umeclidinium-vilanterol (ANORO ELLIPTA) 62.5-25 MCG/INH AEPB Inhale 1 puff into the lungs daily.     Allergies:   Codeine, Morphine, Tylenol [acetaminophen], and Oxycodone   Social History   Socioeconomic History   Marital status: Widowed    Spouse name: Not on file   Number of children: Not on file   Years of education: Not on file   Highest education level: Not on file  Occupational History   Not on file  Social Needs   Financial resource strain: Not on file   Food insecurity    Worry: Not on file    Inability: Not on file   Transportation needs    Medical: Not on file    Non-medical: Not on file  Tobacco Use   Smoking status: Current Every Day Smoker    Packs/day: 0.50    Types: Cigarettes   Smokeless tobacco: Never Used  Substance and Sexual Activity   Alcohol use: No   Drug use: No   Sexual activity: Never  Lifestyle   Physical activity    Days per week: Not on file    Minutes per session: Not on file   Stress: Not on file  Relationships   Social connections    Talks on phone: Not on file    Gets together: Not on file    Attends religious service: Not on file    Active member of club or organization: Not on file    Attends meetings of clubs or organizations: Not on file    Relationship status: Not on file  Other Topics Concern   Not on file  Social History Narrative   Not on file     Family History: The patient's family history includes CVA in her father; Cerebral aneurysm in her mother; Heart attack in her father and sister.  ROS:   Review of Systems  Constitution: Negative for decreased appetite, fever and weight gain.  HENT: Negative for congestion, ear discharge, hoarse voice and sore throat.   Eyes: Negative for discharge, redness, vision loss in right eye and visual halos.  Cardiovascular: Negative for chest pain, dyspnea on  exertion, leg swelling, orthopnea and palpitations.  Respiratory: Negative for cough, hemoptysis, shortness of breath and snoring.   Endocrine: Negative for heat intolerance and polyphagia.  Hematologic/Lymphatic: Negative for bleeding problem. Does not bruise/bleed easily.  Skin: Negative for flushing, nail changes, rash and suspicious lesions.  Musculoskeletal: Negative for arthritis, joint pain, muscle cramps, myalgias, neck pain and stiffness.  Gastrointestinal: Negative for abdominal pain, bowel incontinence, diarrhea and  excessive appetite.  Genitourinary: Negative for decreased libido, genital sores and incomplete emptying.  Neurological: Negative for brief paralysis, focal weakness, headaches and loss of balance.  Psychiatric/Behavioral: Negative for altered mental status, depression and suicidal ideas.  Allergic/Immunologic: Negative for HIV exposure and persistent infections.    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  Compared to EKG performed on September 8 the patient was in sinus rhythm.  Recent Labs: 01/26/2019: BUN 17; Creatinine, Ser 0.71; Magnesium 1.8; Potassium 4.8; Sodium 137  Recent Lipid Panel No results found for: CHOL, TRIG, HDL, CHOLHDL, VLDL, LDLCALC, LDLDIRECT  Physical Exam:    VS:  BP (!) 186/100 (BP Location: Left Arm, Patient Position: Sitting, Cuff Size: Normal)    Pulse 73    Ht 5\' 2"  (1.575 m)    Wt 132 lb 9.6 oz (60.1 kg)    SpO2 96%    BMI 24.25 kg/m     Wt Readings from Last 3 Encounters:  02/05/19 132 lb 9.6 oz (60.1 kg)  01/12/12 125 lb (56.7 kg)     GEN: Well nourished, well developed in no acute distress HEENT: Normal NECK: No JVD; No carotid bruits LYMPHATICS: No lymphadenopathy CARDIAC: S1S2 noted,RRR, no murmurs, rubs, gallops RESPIRATORY:  Clear to auscultation without  rales, wheezing or rhonchi  ABDOMEN: Soft, non-tender, non-distended, +bowel sounds, no guarding. EXTREMITIES: No edema, No cyanosis, no clubbing MUSCULOSKELETAL:  No edema; No deformity  SKIN: Warm and dry NEUROLOGIC:  Alert and oriented x 3, non-focal PSYCHIATRIC:  Normal affect, good insight  ASSESSMENT:    1. Chest pain of uncertain etiology   2. Ectopic atrial rhythm   3. Essential hypertension    PLAN:    1.  She is hypertensive in the office today.  And at this time she is not willing to start any on antihypertensives for now.  Given that she is on the fludrocortisone 0.1 mg daily, in an attempt to taper off of asked patient to take this medication every other day.  She will continue to take her blood pressure daily for [place] to be able to monitor her readings on days she is off of the fludrocortisone.  In terms of her chest pain does sound atypical, but I have ordered nitroglycerin as needed and patient has been educated on this medication. She will continue to wear a ZIO patch.  In terms of evidence of ectopic atrial rhythm on her EKG today we will investigate her ZIO patch monitoring once we are able to do so.  No more syncope episodes since her last visit. We will continue to monitor.  The patient is in agreement with the above plan. The patient left the office in stable condition.  The patient will follow up in 2 to 3 weeks.  Medication Adjustments/Labs and Tests Ordered: Current medicines are reviewed at length with the patient today.  Concerns regarding medicines are outlined above.  Orders Placed This Encounter  Procedures   EKG 12-Lead   Meds ordered this encounter  Medications   nitroGLYCERIN (NITROSTAT) 0.4 MG SL tablet    Sig: Place 1 tablet (0.4 mg total) under the tongue every 5 (five) minutes as needed for chest pain.    Dispense:  25 tablet    Refill:  5    Patient Instructions  Medication Instructions:  Your physician has recommended you make the  following change in your medication:  Nitroglycerin 0.4 mg sublingual (under your tongue) as needed for chest pain. If experiencing chest pain, stop what you are doing and sit down. Take 1 nitroglycerin and wait 5 minutes. If chest pain continues, take another nitroglycerin and wait 5 minutes. If chest pain does not subside, take 1 more nitroglycerin and dial 911. You make take a total of 3 nitroglycerin in a 15 minute time frame.  DECREASE: Florinef to every other day  If you need a refill on your cardiac medications before your next appointment, please call your pharmacy.   Lab work: None If you have labs (blood work) drawn today and your tests are completely normal, you will receive your results only by:  [place] (if you have MyChart) OR  A paper copy in the mail If you have any lab test that is abnormal or we need to change your treatment, we will call you to review the results.  Testing/Procedures: None  Follow-Up: At [facility], you and your health needs are our priority.  As part of our continuing mission to provide you with exceptional heart care, we have created designated Provider Care Teams.  These Care Teams include your primary Cardiologist (physician) and Advanced Practice Providers (APPs -  Physician Assistants and Nurse Practitioners) who all work together to provide you with the care you need, when you need it. You will need a follow up appointment in 3 weeks.  Please call our office 2 months in advance to schedule this appointment.  You may see Berniece Salines, DO Any Other Special Instructions Will Be Listed Below (If Applicable).       Adopting a Healthy Lifestyle.  Know what a healthy weight is for you (roughly BMI <25) and aim to maintain this   Aim for 7+ servings of fruits and vegetables daily   65-80+ fluid ounces of water or unsweet tea for healthy kidneys   Limit to max 1 drink of alcohol per day; avoid smoking/tobacco   Limit animal fats in  diet for cholesterol and heart health - choose grass fed whenever available   Avoid highly processed foods, and foods high in saturated/trans fats   Aim for low stress - take time to unwind and care for your mental health   Aim for 150 min of moderate intensity exercise weekly for heart health, and weights twice weekly for bone health   Aim for 7-9 hours of sleep daily   When it comes to diets, agreement about the perfect plan isnt easy to find, even among the experts. Experts at the [employer] developed an idea known as the Healthy Eating Plate. Just imagine a plate divided into logical, healthy portions.   The emphasis is on diet quality:   Load up on vegetables and fruits - one-half of your plate: Aim for color and variety, and remember that potatoes dont count.   Go for whole grains - one-quarter of your plate: Whole wheat, barley, wheat berries, quinoa, oats, brown rice, and foods made with them. If you want pasta, go with whole wheat pasta.   Protein power - one-quarter of your plate: Fish, chicken, beans, and nuts are all healthy, versatile protein sources. Limit red meat.   The diet, however, does go beyond the plate, offering a few other suggestions.   Use healthy plant oils, such as olive, canola, soy, corn, sunflower and peanut. Check the labels, and avoid partially hydrogenated oil, which have unhealthy trans fats.  If youre thirsty, drink water. Coffee and tea are good in moderation, but skip sugary drinks and limit milk and dairy products to one or two daily servings.   The type of carbohydrate in the diet is more important than the amount. Some sources of carbohydrates, such as vegetables, fruits, whole grains, and beans-are healthier than others.   Finally, stay active  Signed, Berniece Salines, DO  02/06/2019 8:25 AM    [place]

## 2019-02-05 NOTE — Patient Instructions (Signed)
Medication Instructions:  Your physician has recommended you make the following change in your medication:  Nitroglycerin 0.4 mg sublingual (under your tongue) as needed for chest pain. If experiencing chest pain, stop what you are doing and sit down. Take 1 nitroglycerin and wait 5 minutes. If chest pain continues, take another nitroglycerin and wait 5 minutes. If chest pain does not subside, take 1 more nitroglycerin and dial 911. You make take a total of 3 nitroglycerin in a 15 minute time frame.  DECREASE: Florinef to every other day  If you need a refill on your cardiac medications before your next appointment, please call your pharmacy.   Lab work: None If you have labs (blood work) drawn today and your tests are completely normal, you will receive your results only by: [facility] MyChart Message (if you have MyChart) OR . A paper copy in the mail If you have any lab test that is abnormal or we need to change your treatment, we will call you to review the results.  Testing/Procedures: None  Follow-Up: At [facility], you and your health needs are our priority.  As part of our continuing mission to provide you with exceptional heart care, we have created designated Provider Care Teams.  These Care Teams include your primary Cardiologist (physician) and Advanced Practice Providers (APPs -  Physician Assistants and Nurse Practitioners) who all work together to provide you with the care you need, when you need it. You will need a follow up appointment in 3 weeks.  Please call our office 2 months in advance to schedule this appointment.  You may see Berniece Salines, DO Any Other Special Instructions Will Be Listed Below (If Applicable).

## 2019-02-06 DIAGNOSIS — I5033 Acute on chronic diastolic (congestive) heart failure: Secondary | ICD-10-CM | POA: Diagnosis not present

## 2019-02-06 DIAGNOSIS — E1142 Type 2 diabetes mellitus with diabetic polyneuropathy: Secondary | ICD-10-CM | POA: Diagnosis not present

## 2019-02-06 DIAGNOSIS — H6693 Otitis media, unspecified, bilateral: Secondary | ICD-10-CM | POA: Diagnosis not present

## 2019-02-06 DIAGNOSIS — E538 Deficiency of other specified B group vitamins: Secondary | ICD-10-CM | POA: Diagnosis not present

## 2019-02-06 DIAGNOSIS — I951 Orthostatic hypotension: Secondary | ICD-10-CM | POA: Diagnosis not present

## 2019-02-06 DIAGNOSIS — Z602 Problems related to living alone: Secondary | ICD-10-CM | POA: Diagnosis not present

## 2019-02-06 DIAGNOSIS — R002 Palpitations: Secondary | ICD-10-CM | POA: Diagnosis not present

## 2019-02-06 DIAGNOSIS — E785 Hyperlipidemia, unspecified: Secondary | ICD-10-CM | POA: Diagnosis not present

## 2019-02-06 DIAGNOSIS — Z87891 Personal history of nicotine dependence: Secondary | ICD-10-CM | POA: Diagnosis not present

## 2019-02-06 DIAGNOSIS — R262 Difficulty in walking, not elsewhere classified: Secondary | ICD-10-CM | POA: Diagnosis not present

## 2019-02-06 DIAGNOSIS — M1991 Primary osteoarthritis, unspecified site: Secondary | ICD-10-CM | POA: Diagnosis not present

## 2019-02-06 DIAGNOSIS — Z85038 Personal history of other malignant neoplasm of large intestine: Secondary | ICD-10-CM | POA: Diagnosis not present

## 2019-02-06 DIAGNOSIS — Z7982 Long term (current) use of aspirin: Secondary | ICD-10-CM | POA: Diagnosis not present

## 2019-02-06 DIAGNOSIS — H548 Legal blindness, as defined in USA: Secondary | ICD-10-CM | POA: Diagnosis not present

## 2019-02-06 DIAGNOSIS — Z79899 Other long term (current) drug therapy: Secondary | ICD-10-CM | POA: Diagnosis not present

## 2019-02-06 DIAGNOSIS — J449 Chronic obstructive pulmonary disease, unspecified: Secondary | ICD-10-CM | POA: Diagnosis not present

## 2019-02-06 DIAGNOSIS — I110 Hypertensive heart disease with heart failure: Secondary | ICD-10-CM | POA: Diagnosis not present

## 2019-02-13 ENCOUNTER — Ambulatory Visit: Payer: Medicare Other | Admitting: Cardiology

## 2019-02-13 ENCOUNTER — Telehealth: Payer: Self-pay | Admitting: *Deleted

## 2019-02-13 DIAGNOSIS — H6693 Otitis media, unspecified, bilateral: Secondary | ICD-10-CM | POA: Diagnosis not present

## 2019-02-13 DIAGNOSIS — Z7982 Long term (current) use of aspirin: Secondary | ICD-10-CM | POA: Diagnosis not present

## 2019-02-13 DIAGNOSIS — E538 Deficiency of other specified B group vitamins: Secondary | ICD-10-CM | POA: Diagnosis not present

## 2019-02-13 DIAGNOSIS — I249 Acute ischemic heart disease, unspecified: Secondary | ICD-10-CM | POA: Diagnosis not present

## 2019-02-13 DIAGNOSIS — E1142 Type 2 diabetes mellitus with diabetic polyneuropathy: Secondary | ICD-10-CM | POA: Diagnosis not present

## 2019-02-13 DIAGNOSIS — M545 Low back pain: Secondary | ICD-10-CM | POA: Diagnosis not present

## 2019-02-13 DIAGNOSIS — I5033 Acute on chronic diastolic (congestive) heart failure: Secondary | ICD-10-CM | POA: Diagnosis not present

## 2019-02-13 DIAGNOSIS — M1991 Primary osteoarthritis, unspecified site: Secondary | ICD-10-CM | POA: Diagnosis not present

## 2019-02-13 DIAGNOSIS — Z85038 Personal history of other malignant neoplasm of large intestine: Secondary | ICD-10-CM | POA: Diagnosis not present

## 2019-02-13 DIAGNOSIS — Z79899 Other long term (current) drug therapy: Secondary | ICD-10-CM | POA: Diagnosis not present

## 2019-02-13 DIAGNOSIS — R0989 Other specified symptoms and signs involving the circulatory and respiratory systems: Secondary | ICD-10-CM | POA: Diagnosis not present

## 2019-02-13 DIAGNOSIS — M5136 Other intervertebral disc degeneration, lumbar region: Secondary | ICD-10-CM | POA: Diagnosis not present

## 2019-02-13 DIAGNOSIS — Z720 Tobacco use: Secondary | ICD-10-CM | POA: Diagnosis not present

## 2019-02-13 DIAGNOSIS — I10 Essential (primary) hypertension: Secondary | ICD-10-CM

## 2019-02-13 DIAGNOSIS — R200 Anesthesia of skin: Secondary | ICD-10-CM | POA: Diagnosis not present

## 2019-02-13 DIAGNOSIS — E785 Hyperlipidemia, unspecified: Secondary | ICD-10-CM | POA: Diagnosis not present

## 2019-02-13 DIAGNOSIS — I951 Orthostatic hypotension: Secondary | ICD-10-CM | POA: Diagnosis not present

## 2019-02-13 DIAGNOSIS — R002 Palpitations: Secondary | ICD-10-CM | POA: Diagnosis not present

## 2019-02-13 DIAGNOSIS — R079 Chest pain, unspecified: Secondary | ICD-10-CM | POA: Diagnosis not present

## 2019-02-13 DIAGNOSIS — Z602 Problems related to living alone: Secondary | ICD-10-CM | POA: Diagnosis not present

## 2019-02-13 DIAGNOSIS — Z8673 Personal history of transient ischemic attack (TIA), and cerebral infarction without residual deficits: Secondary | ICD-10-CM | POA: Diagnosis not present

## 2019-02-13 DIAGNOSIS — I6523 Occlusion and stenosis of bilateral carotid arteries: Secondary | ICD-10-CM | POA: Diagnosis not present

## 2019-02-13 DIAGNOSIS — I110 Hypertensive heart disease with heart failure: Secondary | ICD-10-CM | POA: Diagnosis not present

## 2019-02-13 DIAGNOSIS — R262 Difficulty in walking, not elsewhere classified: Secondary | ICD-10-CM | POA: Diagnosis not present

## 2019-02-13 DIAGNOSIS — H548 Legal blindness, as defined in USA: Secondary | ICD-10-CM | POA: Diagnosis not present

## 2019-02-13 DIAGNOSIS — D72829 Elevated white blood cell count, unspecified: Secondary | ICD-10-CM | POA: Diagnosis not present

## 2019-02-13 DIAGNOSIS — Z87891 Personal history of nicotine dependence: Secondary | ICD-10-CM | POA: Diagnosis not present

## 2019-02-13 DIAGNOSIS — J449 Chronic obstructive pulmonary disease, unspecified: Secondary | ICD-10-CM | POA: Diagnosis not present

## 2019-02-13 DIAGNOSIS — E876 Hypokalemia: Secondary | ICD-10-CM | POA: Diagnosis not present

## 2019-02-13 NOTE — Telephone Encounter (Signed)
Home Health nurse called to let Heather Mccoy know that her BP is averaging 160/90. Has not gone below 160 over 90 and gone into the 200's over 100. She has headache. Please advise. Dr. Harriet Masson spoke with the nurse.

## 2019-02-14 ENCOUNTER — Telehealth: Payer: Self-pay | Admitting: Cardiology

## 2019-02-14 DIAGNOSIS — E1142 Type 2 diabetes mellitus with diabetic polyneuropathy: Secondary | ICD-10-CM

## 2019-02-14 DIAGNOSIS — R200 Anesthesia of skin: Secondary | ICD-10-CM | POA: Diagnosis not present

## 2019-02-14 DIAGNOSIS — R079 Chest pain, unspecified: Secondary | ICD-10-CM

## 2019-02-14 DIAGNOSIS — E876 Hypokalemia: Secondary | ICD-10-CM | POA: Diagnosis not present

## 2019-02-14 DIAGNOSIS — Z720 Tobacco use: Secondary | ICD-10-CM

## 2019-02-14 DIAGNOSIS — R0989 Other specified symptoms and signs involving the circulatory and respiratory systems: Secondary | ICD-10-CM

## 2019-02-14 DIAGNOSIS — H538 Other visual disturbances: Secondary | ICD-10-CM | POA: Diagnosis not present

## 2019-02-14 DIAGNOSIS — I10 Essential (primary) hypertension: Secondary | ICD-10-CM | POA: Diagnosis not present

## 2019-02-14 NOTE — Telephone Encounter (Signed)
I receive a phone call from the patient home nurse reporting that her blood pressure has been running from 0000000 systolic with the diastolic in [id number]. I explained to the nurse that the patient had been off her antihypertensives due to multiple syncope episode.  At her last hospitalization she was taken off antihypertensive and placed on midodrine and fludrocortisone.  Her midodrine was stopped at initial encounter.  And we are weaning off the fludrocortisone.  Given his elevated blood pressures.  Recommended patient restart her hydralazine 50 mg twice daily which she previously took.  And her lisinopril at 20 mg daily, she previously took 40 mg daily. An hour later, I received call from the patient's nurse again stating that the patient and her friend reported that she was having some tingling in her hand therefore recommended patient be evaluated in the emergency department.

## 2019-02-15 ENCOUNTER — Telehealth: Payer: Self-pay

## 2019-02-15 DIAGNOSIS — E1142 Type 2 diabetes mellitus with diabetic polyneuropathy: Secondary | ICD-10-CM | POA: Diagnosis not present

## 2019-02-15 DIAGNOSIS — H6693 Otitis media, unspecified, bilateral: Secondary | ICD-10-CM | POA: Diagnosis not present

## 2019-02-15 DIAGNOSIS — Z602 Problems related to living alone: Secondary | ICD-10-CM | POA: Diagnosis not present

## 2019-02-15 DIAGNOSIS — R002 Palpitations: Secondary | ICD-10-CM | POA: Diagnosis not present

## 2019-02-15 DIAGNOSIS — Z85038 Personal history of other malignant neoplasm of large intestine: Secondary | ICD-10-CM | POA: Diagnosis not present

## 2019-02-15 DIAGNOSIS — Z87891 Personal history of nicotine dependence: Secondary | ICD-10-CM | POA: Diagnosis not present

## 2019-02-15 DIAGNOSIS — R262 Difficulty in walking, not elsewhere classified: Secondary | ICD-10-CM | POA: Diagnosis not present

## 2019-02-15 DIAGNOSIS — E785 Hyperlipidemia, unspecified: Secondary | ICD-10-CM | POA: Diagnosis not present

## 2019-02-15 DIAGNOSIS — H548 Legal blindness, as defined in USA: Secondary | ICD-10-CM | POA: Diagnosis not present

## 2019-02-15 DIAGNOSIS — I5033 Acute on chronic diastolic (congestive) heart failure: Secondary | ICD-10-CM | POA: Diagnosis not present

## 2019-02-15 DIAGNOSIS — E538 Deficiency of other specified B group vitamins: Secondary | ICD-10-CM | POA: Diagnosis not present

## 2019-02-15 DIAGNOSIS — I110 Hypertensive heart disease with heart failure: Secondary | ICD-10-CM | POA: Diagnosis not present

## 2019-02-15 DIAGNOSIS — M1991 Primary osteoarthritis, unspecified site: Secondary | ICD-10-CM | POA: Diagnosis not present

## 2019-02-15 DIAGNOSIS — I951 Orthostatic hypotension: Secondary | ICD-10-CM | POA: Diagnosis not present

## 2019-02-15 DIAGNOSIS — Z79899 Other long term (current) drug therapy: Secondary | ICD-10-CM | POA: Diagnosis not present

## 2019-02-15 DIAGNOSIS — Z7982 Long term (current) use of aspirin: Secondary | ICD-10-CM | POA: Diagnosis not present

## 2019-02-15 DIAGNOSIS — J449 Chronic obstructive pulmonary disease, unspecified: Secondary | ICD-10-CM | POA: Diagnosis not present

## 2019-02-15 NOTE — Telephone Encounter (Signed)
Patient was kept overnight at [facility]. Patient is home today . BP 140/70 and patient is checking 3 x's per day. Patient was instructed to take fludrocortisone by [facility] but home health has d/c'd it. She is currently taking lisinopril 20 mg, hydralazine 50 mg (2x's daily) and was started on  lasix 20 mg( 1 tablet) daily, KCL 20MEQ, Plavix 75 mg (1 tablet) once daily.Note relayed to Dr. Harriet Masson for consideration.

## 2019-02-16 DIAGNOSIS — Z85038 Personal history of other malignant neoplasm of large intestine: Secondary | ICD-10-CM | POA: Diagnosis not present

## 2019-02-16 DIAGNOSIS — I110 Hypertensive heart disease with heart failure: Secondary | ICD-10-CM | POA: Diagnosis not present

## 2019-02-16 DIAGNOSIS — E785 Hyperlipidemia, unspecified: Secondary | ICD-10-CM | POA: Diagnosis not present

## 2019-02-16 DIAGNOSIS — I951 Orthostatic hypotension: Secondary | ICD-10-CM | POA: Diagnosis not present

## 2019-02-16 DIAGNOSIS — M1991 Primary osteoarthritis, unspecified site: Secondary | ICD-10-CM | POA: Diagnosis not present

## 2019-02-16 DIAGNOSIS — Z87898 Personal history of other specified conditions: Secondary | ICD-10-CM | POA: Diagnosis not present

## 2019-02-16 DIAGNOSIS — Z7982 Long term (current) use of aspirin: Secondary | ICD-10-CM | POA: Diagnosis not present

## 2019-02-16 DIAGNOSIS — I5033 Acute on chronic diastolic (congestive) heart failure: Secondary | ICD-10-CM | POA: Diagnosis not present

## 2019-02-16 DIAGNOSIS — R002 Palpitations: Secondary | ICD-10-CM | POA: Diagnosis not present

## 2019-02-16 DIAGNOSIS — H6693 Otitis media, unspecified, bilateral: Secondary | ICD-10-CM | POA: Diagnosis not present

## 2019-02-16 DIAGNOSIS — H548 Legal blindness, as defined in USA: Secondary | ICD-10-CM | POA: Diagnosis not present

## 2019-02-16 DIAGNOSIS — E538 Deficiency of other specified B group vitamins: Secondary | ICD-10-CM | POA: Diagnosis not present

## 2019-02-16 DIAGNOSIS — Z602 Problems related to living alone: Secondary | ICD-10-CM | POA: Diagnosis not present

## 2019-02-16 DIAGNOSIS — J449 Chronic obstructive pulmonary disease, unspecified: Secondary | ICD-10-CM | POA: Diagnosis not present

## 2019-02-16 DIAGNOSIS — Z79899 Other long term (current) drug therapy: Secondary | ICD-10-CM | POA: Diagnosis not present

## 2019-02-16 DIAGNOSIS — Z87891 Personal history of nicotine dependence: Secondary | ICD-10-CM | POA: Diagnosis not present

## 2019-02-16 DIAGNOSIS — I471 Supraventricular tachycardia: Secondary | ICD-10-CM | POA: Diagnosis not present

## 2019-02-16 DIAGNOSIS — R262 Difficulty in walking, not elsewhere classified: Secondary | ICD-10-CM | POA: Diagnosis not present

## 2019-02-16 DIAGNOSIS — E1142 Type 2 diabetes mellitus with diabetic polyneuropathy: Secondary | ICD-10-CM | POA: Diagnosis not present

## 2019-02-16 NOTE — Telephone Encounter (Signed)
Please have her take her blood pressure daily and have her call us if systolic bp is [phone] and also if systolic bp 0000000. I am ok with keeping her off the fludrocortisone.  Also please ask her to have her home health nurse call us with report of her blood pressure after her visit until we can see her on  October 14th.

## 2019-02-16 NOTE — Telephone Encounter (Signed)
Mickel Baas please have Mrs. Ciulla see [place] in [facility] next week.  Thank you

## 2019-02-16 NOTE — Telephone Encounter (Signed)
Telephone call to patient. Informed Dr Harriet Masson would like to see her next week in [facility] office. States can not get to [facility] next week because she does not have a ride. She does have an appointment Oct 14th in the [facility] office and can make that appointment. She does state she feels better since going to the hospital. Will notify Dr Harriet Masson.

## 2019-02-16 NOTE — Telephone Encounter (Signed)
Telephone call to patient. Left message to return call. 

## 2019-02-19 ENCOUNTER — Telehealth: Payer: Self-pay | Admitting: Cardiology

## 2019-02-19 DIAGNOSIS — Z87891 Personal history of nicotine dependence: Secondary | ICD-10-CM | POA: Diagnosis not present

## 2019-02-19 DIAGNOSIS — E538 Deficiency of other specified B group vitamins: Secondary | ICD-10-CM | POA: Diagnosis not present

## 2019-02-19 DIAGNOSIS — Z7982 Long term (current) use of aspirin: Secondary | ICD-10-CM | POA: Diagnosis not present

## 2019-02-19 DIAGNOSIS — I951 Orthostatic hypotension: Secondary | ICD-10-CM | POA: Diagnosis not present

## 2019-02-19 DIAGNOSIS — R002 Palpitations: Secondary | ICD-10-CM | POA: Diagnosis not present

## 2019-02-19 DIAGNOSIS — E1142 Type 2 diabetes mellitus with diabetic polyneuropathy: Secondary | ICD-10-CM | POA: Diagnosis not present

## 2019-02-19 DIAGNOSIS — M1991 Primary osteoarthritis, unspecified site: Secondary | ICD-10-CM | POA: Diagnosis not present

## 2019-02-19 DIAGNOSIS — I110 Hypertensive heart disease with heart failure: Secondary | ICD-10-CM | POA: Diagnosis not present

## 2019-02-19 DIAGNOSIS — I5033 Acute on chronic diastolic (congestive) heart failure: Secondary | ICD-10-CM | POA: Diagnosis not present

## 2019-02-19 DIAGNOSIS — H548 Legal blindness, as defined in USA: Secondary | ICD-10-CM | POA: Diagnosis not present

## 2019-02-19 DIAGNOSIS — R262 Difficulty in walking, not elsewhere classified: Secondary | ICD-10-CM | POA: Diagnosis not present

## 2019-02-19 DIAGNOSIS — Z85038 Personal history of other malignant neoplasm of large intestine: Secondary | ICD-10-CM | POA: Diagnosis not present

## 2019-02-19 DIAGNOSIS — Z79899 Other long term (current) drug therapy: Secondary | ICD-10-CM | POA: Diagnosis not present

## 2019-02-19 DIAGNOSIS — J449 Chronic obstructive pulmonary disease, unspecified: Secondary | ICD-10-CM | POA: Diagnosis not present

## 2019-02-19 DIAGNOSIS — H6693 Otitis media, unspecified, bilateral: Secondary | ICD-10-CM | POA: Diagnosis not present

## 2019-02-19 DIAGNOSIS — Z602 Problems related to living alone: Secondary | ICD-10-CM | POA: Diagnosis not present

## 2019-02-19 DIAGNOSIS — E785 Hyperlipidemia, unspecified: Secondary | ICD-10-CM | POA: Diagnosis not present

## 2019-02-19 MED ORDER — HYDRALAZINE HCL 25 MG PO TABS: 25.0000 mg | ORAL_TABLET | Freq: Two times a day (BID) | ORAL | 3 refills | Status: DC

## 2019-02-19 NOTE — Telephone Encounter (Signed)
Telephone call to patient. Informed to stop furosemide(Lasix) and take hydralazine 25 mg (1/2  50 mg tab) twice daily and keep follow up appointment. Patient verbalized understanding.

## 2019-02-19 NOTE — Telephone Encounter (Signed)
See next encounter note

## 2019-02-19 NOTE — Telephone Encounter (Signed)
Heather Mccoy called from Home health her blood is dropping to 118/66 and standing 100/60 ans he is symptomatic with that.. RN was very persistent and was told that Dr Harriet Masson said to stop what she is doing to talk to her but I still took message due to Dr Harriet Masson in the [facility] office.

## 2019-02-19 NOTE — Telephone Encounter (Signed)
Please asked him to stop the Lasix.  And decrease the hydralazine to 25 mg twice daily.

## 2019-02-19 NOTE — Telephone Encounter (Signed)
Her hydralazine does not split

## 2019-02-19 NOTE — Telephone Encounter (Signed)
Telephone call to patient. Spoke with home health RN who states blood pressures are going from 118/66 Hr in the 70's to 100/60   Hr in the 90's standing. Symptomatic with dizziness. Patient takes hydralazine 50mg  BID ,lasix 20mg  QD and lisinopril 20 QHS.  Please advise.

## 2019-02-19 NOTE — Addendum Note (Signed)
Addended by: Particia Nearing B on: 02/19/2019 03:08 PM   Modules accepted: Orders

## 2019-02-20 ENCOUNTER — Other Ambulatory Visit: Payer: Self-pay

## 2019-02-20 NOTE — Patient Outreach (Signed)
[facility]) Care Management  02/20/2019  ABIEL LINKO 01/15/1946 [id number]   Medication Adherence call to Mrs. Jazmaine Mew [place] Identifiers Verify spoke with patient she is past due on Atorvastatin 40 mg patient explain she has been in the hospital  2 times reason why she still has medication she has another 20 more days patient will order when due. Mrs. Rushlow is showing past due under [place].  [facility] Management Direct Dial [phone]  Fax [phone] [email or web address]

## 2019-02-21 DIAGNOSIS — E782 Mixed hyperlipidemia: Secondary | ICD-10-CM | POA: Diagnosis not present

## 2019-02-21 DIAGNOSIS — H6693 Otitis media, unspecified, bilateral: Secondary | ICD-10-CM | POA: Diagnosis not present

## 2019-02-21 DIAGNOSIS — K219 Gastro-esophageal reflux disease without esophagitis: Secondary | ICD-10-CM | POA: Diagnosis not present

## 2019-02-21 DIAGNOSIS — Z602 Problems related to living alone: Secondary | ICD-10-CM | POA: Diagnosis not present

## 2019-02-21 DIAGNOSIS — E538 Deficiency of other specified B group vitamins: Secondary | ICD-10-CM | POA: Diagnosis not present

## 2019-02-21 DIAGNOSIS — E785 Hyperlipidemia, unspecified: Secondary | ICD-10-CM | POA: Diagnosis not present

## 2019-02-21 DIAGNOSIS — J449 Chronic obstructive pulmonary disease, unspecified: Secondary | ICD-10-CM | POA: Diagnosis not present

## 2019-02-21 DIAGNOSIS — M1991 Primary osteoarthritis, unspecified site: Secondary | ICD-10-CM | POA: Diagnosis not present

## 2019-02-21 DIAGNOSIS — Z79899 Other long term (current) drug therapy: Secondary | ICD-10-CM | POA: Diagnosis not present

## 2019-02-21 DIAGNOSIS — D649 Anemia, unspecified: Secondary | ICD-10-CM | POA: Diagnosis not present

## 2019-02-21 DIAGNOSIS — I110 Hypertensive heart disease with heart failure: Secondary | ICD-10-CM | POA: Diagnosis not present

## 2019-02-21 DIAGNOSIS — H548 Legal blindness, as defined in USA: Secondary | ICD-10-CM | POA: Diagnosis not present

## 2019-02-21 DIAGNOSIS — R002 Palpitations: Secondary | ICD-10-CM | POA: Diagnosis not present

## 2019-02-21 DIAGNOSIS — Z85038 Personal history of other malignant neoplasm of large intestine: Secondary | ICD-10-CM | POA: Diagnosis not present

## 2019-02-21 DIAGNOSIS — I5033 Acute on chronic diastolic (congestive) heart failure: Secondary | ICD-10-CM | POA: Diagnosis not present

## 2019-02-21 DIAGNOSIS — I951 Orthostatic hypotension: Secondary | ICD-10-CM | POA: Diagnosis not present

## 2019-02-21 DIAGNOSIS — R262 Difficulty in walking, not elsewhere classified: Secondary | ICD-10-CM | POA: Diagnosis not present

## 2019-02-21 DIAGNOSIS — R200 Anesthesia of skin: Secondary | ICD-10-CM | POA: Diagnosis not present

## 2019-02-21 DIAGNOSIS — E1142 Type 2 diabetes mellitus with diabetic polyneuropathy: Secondary | ICD-10-CM | POA: Diagnosis not present

## 2019-02-21 DIAGNOSIS — Z87891 Personal history of nicotine dependence: Secondary | ICD-10-CM | POA: Diagnosis not present

## 2019-02-21 DIAGNOSIS — Z7982 Long term (current) use of aspirin: Secondary | ICD-10-CM | POA: Diagnosis not present

## 2019-02-26 DIAGNOSIS — E785 Hyperlipidemia, unspecified: Secondary | ICD-10-CM | POA: Diagnosis not present

## 2019-02-26 DIAGNOSIS — E538 Deficiency of other specified B group vitamins: Secondary | ICD-10-CM | POA: Diagnosis not present

## 2019-02-26 DIAGNOSIS — Z7982 Long term (current) use of aspirin: Secondary | ICD-10-CM | POA: Diagnosis not present

## 2019-02-26 DIAGNOSIS — M1991 Primary osteoarthritis, unspecified site: Secondary | ICD-10-CM | POA: Diagnosis not present

## 2019-02-26 DIAGNOSIS — J449 Chronic obstructive pulmonary disease, unspecified: Secondary | ICD-10-CM | POA: Diagnosis not present

## 2019-02-26 DIAGNOSIS — I5033 Acute on chronic diastolic (congestive) heart failure: Secondary | ICD-10-CM | POA: Diagnosis not present

## 2019-02-26 DIAGNOSIS — R002 Palpitations: Secondary | ICD-10-CM | POA: Diagnosis not present

## 2019-02-26 DIAGNOSIS — E1142 Type 2 diabetes mellitus with diabetic polyneuropathy: Secondary | ICD-10-CM | POA: Diagnosis not present

## 2019-02-26 DIAGNOSIS — R262 Difficulty in walking, not elsewhere classified: Secondary | ICD-10-CM | POA: Diagnosis not present

## 2019-02-26 DIAGNOSIS — H6693 Otitis media, unspecified, bilateral: Secondary | ICD-10-CM | POA: Diagnosis not present

## 2019-02-26 DIAGNOSIS — H548 Legal blindness, as defined in USA: Secondary | ICD-10-CM | POA: Diagnosis not present

## 2019-02-26 DIAGNOSIS — Z602 Problems related to living alone: Secondary | ICD-10-CM | POA: Diagnosis not present

## 2019-02-26 DIAGNOSIS — I110 Hypertensive heart disease with heart failure: Secondary | ICD-10-CM | POA: Diagnosis not present

## 2019-02-26 DIAGNOSIS — Z79899 Other long term (current) drug therapy: Secondary | ICD-10-CM | POA: Diagnosis not present

## 2019-02-26 DIAGNOSIS — Z87891 Personal history of nicotine dependence: Secondary | ICD-10-CM | POA: Diagnosis not present

## 2019-02-26 DIAGNOSIS — Z85038 Personal history of other malignant neoplasm of large intestine: Secondary | ICD-10-CM | POA: Diagnosis not present

## 2019-02-26 DIAGNOSIS — I951 Orthostatic hypotension: Secondary | ICD-10-CM | POA: Diagnosis not present

## 2019-02-28 ENCOUNTER — Ambulatory Visit (INDEPENDENT_AMBULATORY_CARE_PROVIDER_SITE_OTHER): Payer: Medicare Other | Admitting: Cardiology

## 2019-02-28 ENCOUNTER — Other Ambulatory Visit: Payer: Self-pay

## 2019-02-28 ENCOUNTER — Encounter: Payer: Self-pay | Admitting: Cardiology

## 2019-02-28 VITALS — BP 142/70 | HR 77 | Ht 62.0 in | Wt 130.6 lb

## 2019-02-28 DIAGNOSIS — R197 Diarrhea, unspecified: Secondary | ICD-10-CM

## 2019-02-28 DIAGNOSIS — I471 Supraventricular tachycardia: Secondary | ICD-10-CM | POA: Diagnosis not present

## 2019-02-28 MED ORDER — CARVEDILOL 6.25 MG PO TABS: 6.2500 mg | ORAL_TABLET | Freq: Two times a day (BID) | ORAL | 1 refills | Status: DC

## 2019-02-28 NOTE — Patient Instructions (Addendum)
Medication Instructions:  Your physician has recommended you make the following change in your medication:   STOP: Lisinopril START: Carvedilol 6.25 mg one tablet twice daily  If you need a refill on your cardiac medications before your next appointment, please call your pharmacy.   Lab work: NONE If you have labs (blood work) drawn today and your tests are completely normal, you will receive your results only by: [facility] Kitchen MyChart Message (if you have MyChart) OR . A paper copy in the mail If you have any lab test that is abnormal or we need to change your treatment, we will call you to review the results.  Testing/Procedures: NONE  Follow-Up: At [facility], you and your health needs are our priority.  As part of our continuing mission to provide you with exceptional heart care, we have created designated Provider Care Teams.  These Care Teams include your primary Cardiologist (physician) and Advanced Practice Providers (APPs -  Physician Assistants and Nurse Practitioners) who all work together to provide you with the care you need, when you need it.  You will need a follow up appointment in 1 month with Dr Harriet Masson.     You will have a telephone visit with Dr Melina Copa on 03-01-2019 at 1:40pm.  They will contact you at [phone].

## 2019-02-28 NOTE — Progress Notes (Signed)
Cardiology Office Note:    Date:  02/28/2019   ID:  Heather Mccoy, DOB [birth year], MRN [id number]  PCP:  Rochel Brome, MD  Cardiologist:  Berniece Salines, DO  Electrophysiologist:  None   Referring MD: Rochel Brome, MD    The patient is here for a follow up visit.   History of Present Illness:    Heather Mccoy is a 73 y.o. female with a hx of diabetes mellitus, colon cancer status post surgery, chronic diarrhea, anxiety, COPD not on oxygen, tobacco use, hypertension and hyperlipidemia presents for follow-up visit.      I initially saw the patient on  January 19, 2019 at which time she was posthospitalization.  She was hospitalized at [facility] in August where she was treated for syncope due to orthostatic hypotension. Her medications were optimized which included stopping her diuretics and she was started on midodrine and Florinef.  On completion of our visit given her elevated blood pressure I did advise patient to only take her midodrine 5 mg 3 times daily if her systolic blood pressure was less than 120.  Since her visit she has not needed to take the midodrine.  She was also given a ZIO patch which she is still wearing to complete a 14-day monitoring.  She was continued on the fludrocortisone 0.1 mg daily  The patient was seen on 02/05/2019. During that visit she was hypertensive and was still on fludrocortisone 0.1 mg daily.  Started to titrate off the fludrocortisone. In addition the patient at the time did not want to start any antihypertensive medication.  She was still wearing her Zio patch during the time of her visit.  In the interim did receive a call from the patient home nurse that her blood pressure was significantly high with a systolics in [id number] to [id number].  Therefore at that time I asked her to completely stop her fludrocortisone. she was started back on her hydralazine 50 mg twice daily along with lisinopril 20 mg daily.  She had a transient ED visit due to elevated  blood pressure.  Today she does not have any questions.  She is looking forward to discussing her monitor results.  Also she tells me that she has been experiencing worsening diarrhea.  She denies any chest pain, shortness of breath.  She admits to palpitations. No syncope episodes.   Past Medical History:  Diagnosis Date  . Anxiety   . Arthritis   . Cancer ([place])    colon  . COPD (chronic obstructive pulmonary disease) ([place])   . Diabetes mellitus   . Dysrhythmia   . Hyperlipidemia   . Hypertension   . Mood disorder ([place])   . Orthostatic hypotension   . Shortness of breath     Past Surgical History:  Procedure Laterality Date  . APPENDECTOMY    . CHOLECYSTECTOMY    . COLON SURGERY    . EUS  01/12/2012   Procedure: UPPER ENDOSCOPIC ULTRASOUND (EUS) RADIAL;  Surgeon: Arta Silence, MD;  Location: [facility] ENDOSCOPY;  Service: Endoscopy;  Laterality: N/A;  to be admitted after  . REPLACEMENT TOTAL KNEE      Current Medications: Current Meds  Medication Sig  . Albuterol Sulfate 2.5 MG/0.5ML NEBU Inhale 2.5 mg into the lungs 3 (three) times daily as needed.  Marland Kitchen alendronate (FOSAMAX) 70 MG tablet Take 70 mg by mouth once a week. Take with a full glass of water on an empty stomach.  Marland Kitchen aspirin EC 81 MG tablet Take  81 mg by mouth daily.  Marland Kitchen atorvastatin (LIPITOR) 40 MG tablet Take 40 mg by mouth daily.  . citalopram (CELEXA) 10 MG tablet Take 10 mg by mouth daily.  . clopidogrel (PLAVIX) 75 MG tablet Take 75 mg by mouth daily.  . diclofenac sodium (VOLTAREN) 1 % GEL Apply 2 g topically 4 (four) times daily.  Marland Kitchen dicyclomine (BENTYL) 10 MG capsule Take 1 capsule by mouth 3 (three) times daily before meals.   . famotidine (PEPCID) 40 MG tablet Take 40 mg by mouth 2 (two) times daily.  . hydrALAZINE (APRESOLINE) 50 MG tablet Take 50 mg by mouth 2 (two) times daily with a meal.  . hydrOXYzine (ATARAX/VISTARIL) 25 MG tablet Take 25 mg by mouth 3 (three) times daily as needed.  . loperamide  (IMODIUM) 2 MG capsule Take 2 mg by mouth every 6 (six) hours as needed for diarrhea or loose stools.  Marland Kitchen LORazepam (ATIVAN) 0.5 MG tablet Take 1 mg by mouth daily as needed.   . meclizine (ANTIVERT) 25 MG tablet Take 25 mg by mouth every 6 (six) hours as needed for dizziness.  . midodrine (PROAMATINE) 5 MG tablet Take 5 mg by mouth 3 (three) times daily as needed.  . montelukast (SINGULAIR) 10 MG tablet Take 10 mg by mouth at bedtime.  . Naproxen Sodium (ALEVE) 220 MG CAPS Take 1 capsule by mouth every 6 (six) hours as needed.  . nitroGLYCERIN (NITROSTAT) 0.4 MG SL tablet Place 1 tablet (0.4 mg total) under the tongue every 5 (five) minutes as needed for chest pain.  . pantoprazole (PROTONIX) 40 MG tablet Take 40 mg by mouth daily.  . potassium chloride SA (KLOR-CON) 20 MEQ tablet Take 20 mEq by mouth daily.  . promethazine (PHENERGAN) 25 MG tablet Take 1 tablet by mouth 3 (three) times daily as needed.  . traMADol (ULTRAM) 50 MG tablet Take 50 mg by mouth every 6 (six) hours as needed.  . [DISCONTINUED] lisinopril (ZESTRIL) 40 MG tablet Take 20 mg by mouth at bedtime.     Allergies:   Codeine, Morphine, Tylenol [acetaminophen], and Oxycodone   Social History   Socioeconomic History  . Marital status: Widowed    Spouse name: Not on file  . Number of children: Not on file  . Years of education: Not on file  . Highest education level: Not on file  Occupational History  . Not on file  Social Needs  . Financial resource strain: Not on file  . Food insecurity    Worry: Not on file    Inability: Not on file  . Transportation needs    Medical: Not on file    Non-medical: Not on file  Tobacco Use  . Smoking status: Current Every Day Smoker    Packs/day: 0.50    Types: Cigarettes  . Smokeless tobacco: Never Used  Substance and Sexual Activity  . Alcohol use: No  . Drug use: No  . Sexual activity: Never  Lifestyle  . Physical activity    Days per week: Not on file    Minutes per  session: Not on file  . Stress: Not on file  Relationships  . Social Herbalist on phone: Not on file    Gets together: Not on file    Attends religious service: Not on file    Active member of club or organization: Not on file    Attends meetings of clubs or organizations: Not on file    Relationship status:  Not on file  Other Topics Concern  . Not on file  Social History Narrative  . Not on file     Family History: The patient's family history includes CVA in her father; Cerebral aneurysm in her mother; Heart attack in her father and sister.  ROS:   Review of Systems  Constitution: Negative for decreased appetite, fever and weight gain.  HENT: Negative for congestion, ear discharge, hoarse voice and sore throat.   Eyes: Negative for discharge, redness, vision loss in right eye and visual halos.  Cardiovascular: Negative for chest pain, dyspnea on exertion, leg swelling, orthopnea and palpitations.  Respiratory: Negative for cough, hemoptysis, shortness of breath and snoring.   Endocrine: Negative for heat intolerance and polyphagia.  Hematologic/Lymphatic: Negative for bleeding problem. Does not bruise/bleed easily.  Skin: Negative for flushing, nail changes, rash and suspicious lesions.  Musculoskeletal: Negative for arthritis, joint pain, muscle cramps, myalgias, neck pain and stiffness.  Gastrointestinal: Negative for abdominal pain, bowel incontinence, diarrhea and excessive appetite.  Genitourinary: Negative for decreased libido, genital sores and incomplete emptying.  Neurological: Negative for brief paralysis, focal weakness, headaches and loss of balance.  Psychiatric/Behavioral: Negative for altered mental status, depression and suicidal ideas.  Allergic/Immunologic: Negative for HIV exposure and persistent infections.    EKGs/Labs/Other Studies Reviewed:    The following studies were reviewed today:  EKG: None done today.  Long-term monitor: 01/26/2019.  The Patient wore the monitor for 13 days 23 hours. Indication: Syncope The minimum HR was 49 bp with a maximum HR of 176 bpm, and average HR of 70 bpm.  Predominant underlying rhythm was Sinus Rhythm. A total of 64 Supraventricular Tachycardia runs occurred, the run with the fastest interval lasting 8 beats with a max rate of 176 bpm, the longest lasting 14.1 secs with an avg rate of 127 bpm. The episodes of Supraventricular Tachycardia (likely Atrial Tachycardia with variable block). Occasional Premature atrial complex (PAC) at [phone] total- 2.4%. [PACs Couplets (<1.0%, 3122), and PACs Triplets were rare (<1.0%, 81)]. Premature ventricular complex were rare (<1.0%),  PVC Couplets were rare (<1.0%), with no PVC Triplets Present. No Pauses, No AV block and no Atrial fibrillation present.   Recent Labs: 01/26/2019: BUN 17; Creatinine, Ser 0.71; Magnesium 1.8; Potassium 4.8; Sodium 137  Recent Lipid Panel No results found for: CHOL, TRIG, HDL, CHOLHDL, VLDL, LDLCALC, LDLDIRECT  Physical Exam:    VS:  BP (!) 142/70 (BP Location: Right Arm, Patient Position: Sitting, Cuff Size: Normal)   Pulse 77   Ht 5\' 2"  (1.575 m)   Wt 130 lb 9.6 oz (59.2 kg)   SpO2 97%   BMI 23.89 kg/m     Wt Readings from Last 3 Encounters:  02/28/19 130 lb 9.6 oz (59.2 kg)  02/05/19 132 lb 9.6 oz (60.1 kg)  01/12/12 125 lb (56.7 kg)     GEN: Well nourished, well developed in no acute distress HEENT: Normal NECK: No JVD; No carotid bruits LYMPHATICS: No lymphadenopathy CARDIAC: S1S2 noted,RRR, no murmurs, rubs, gallops RESPIRATORY:  Clear to auscultation without rales, wheezing or rhonchi  ABDOMEN: Soft, non-tender, non-distended, +bowel sounds, no guarding. EXTREMITIES: No edema, No cyanosis, no clubbing MUSCULOSKELETAL:  No edema; No deformity  SKIN: Warm and dry NEUROLOGIC:  Alert and oriented x 3, non-focal PSYCHIATRIC:  Normal affect, good insight  ASSESSMENT:    1. Paroxysmal atrial tachycardia  ([place])   2. Diarrhea, unspecified type    PLAN:    The patient appears to be doing well  from a cardiovascular standpoint.  Her blood pressure seems to be close to target which is less than 130/80.  I was able to discuss with your patient about her monitor.  There is evidence of paroxysmal atrial tachycardia.  Given her need for additional medication, but will start patient on carvedilol 6.125 mg.  This will help with blood pressure as well as heart rate control.  Hopefully this will help.   She is complaining of worsening diarrhea.  I have referred patient to her gastroenterologist for help patient set up the appointment for this.  Blood work prior to her next visit.  The patient is in agreement with the above plan. The patient left the office in stable condition.  The patient will follow up in 1 month.   Medication Adjustments/Labs and Tests Ordered: Current medicines are reviewed at length with the patient today.  Concerns regarding medicines are outlined above.  No orders of the defined types were placed in this encounter.  Meds ordered this encounter  Medications  . carvedilol (COREG) 6.25 MG tablet    Sig: Take 1 tablet (6.25 mg total) by mouth 2 (two) times daily.    Dispense:  60 tablet    Refill:  1    Patient Instructions  Medication Instructions:  Your physician has recommended you make the following change in your medication:   STOP: Lisinopril START: Carvedilol 6.25 mg one tablet twice daily  If you need a refill on your cardiac medications before your next appointment, please call your pharmacy.   Lab work: NONE If you have labs (blood work) drawn today and your tests are completely normal, you will receive your results only by: Marland Kitchen MyChart Message (if you have MyChart) OR . A paper copy in the mail If you have any lab test that is abnormal or we need to change your treatment, we will call you to review the results.  Testing/Procedures: NONE  Follow-Up: At  [facility], you and your health needs are our priority.  As part of our continuing mission to provide you with exceptional heart care, we have created designated Provider Care Teams.  These Care Teams include your primary Cardiologist (physician) and Advanced Practice Providers (APPs -  Physician Assistants and Nurse Practitioners) who all work together to provide you with the care you need, when you need it.  You will need a follow up appointment in 1 month with Dr Harriet Masson.     You will have a telephone visit with Dr Melina Copa on 03-01-2019 at 1:40pm.  They will contact you at [phone].    Adopting a Healthy Lifestyle.  Know what a healthy weight is for you (roughly BMI <25) and aim to maintain this   Aim for 7+ servings of fruits and vegetables daily   65-80+ fluid ounces of water or unsweet tea for healthy kidneys   Limit to max 1 drink of alcohol per day; avoid smoking/tobacco   Limit animal fats in diet for cholesterol and heart health - choose grass fed whenever available   Avoid highly processed foods, and foods high in saturated/trans fats   Aim for low stress - take time to unwind and care for your mental health   Aim for 150 min of moderate intensity exercise weekly for heart health, and weights twice weekly for bone health   Aim for 7-9 hours of sleep daily   When it comes to diets, agreement about the perfect plan isnt easy to find, even among the experts. Experts at  the [employer] developed an idea known as the Healthy Eating Plate. Just imagine a plate divided into logical, healthy portions.   The emphasis is on diet quality:   Load up on vegetables and fruits - one-half of your plate: Aim for color and variety, and remember that potatoes dont count.   Go for whole grains - one-quarter of your plate: Whole wheat, barley, wheat berries, quinoa, oats, brown rice, and foods made with them. If you want pasta, go with whole wheat pasta.    Protein power - one-quarter of your plate: Fish, chicken, beans, and nuts are all healthy, versatile protein sources. Limit red meat.   The diet, however, does go beyond the plate, offering a few other suggestions.   Use healthy plant oils, such as olive, canola, soy, corn, sunflower and peanut. Check the labels, and avoid partially hydrogenated oil, which have unhealthy trans fats.   If youre thirsty, drink water. Coffee and tea are good in moderation, but skip sugary drinks and limit milk and dairy products to one or two daily servings.   The type of carbohydrate in the diet is more important than the amount. Some sources of carbohydrates, such as vegetables, fruits, whole grains, and beans-are healthier than others.   Finally, stay active  Signed, Berniece Salines, DO  02/28/2019 10:36 PM    [facility]

## 2019-03-01 DIAGNOSIS — I5033 Acute on chronic diastolic (congestive) heart failure: Secondary | ICD-10-CM | POA: Diagnosis not present

## 2019-03-01 DIAGNOSIS — H548 Legal blindness, as defined in USA: Secondary | ICD-10-CM | POA: Diagnosis not present

## 2019-03-01 DIAGNOSIS — Z87891 Personal history of nicotine dependence: Secondary | ICD-10-CM | POA: Diagnosis not present

## 2019-03-01 DIAGNOSIS — E1142 Type 2 diabetes mellitus with diabetic polyneuropathy: Secondary | ICD-10-CM | POA: Diagnosis not present

## 2019-03-01 DIAGNOSIS — R262 Difficulty in walking, not elsewhere classified: Secondary | ICD-10-CM | POA: Diagnosis not present

## 2019-03-01 DIAGNOSIS — E785 Hyperlipidemia, unspecified: Secondary | ICD-10-CM | POA: Diagnosis not present

## 2019-03-01 DIAGNOSIS — A045 Campylobacter enteritis: Secondary | ICD-10-CM | POA: Diagnosis not present

## 2019-03-01 DIAGNOSIS — E538 Deficiency of other specified B group vitamins: Secondary | ICD-10-CM | POA: Diagnosis not present

## 2019-03-01 DIAGNOSIS — Z7982 Long term (current) use of aspirin: Secondary | ICD-10-CM | POA: Diagnosis not present

## 2019-03-01 DIAGNOSIS — Z602 Problems related to living alone: Secondary | ICD-10-CM | POA: Diagnosis not present

## 2019-03-01 DIAGNOSIS — Z85038 Personal history of other malignant neoplasm of large intestine: Secondary | ICD-10-CM | POA: Diagnosis not present

## 2019-03-01 DIAGNOSIS — I951 Orthostatic hypotension: Secondary | ICD-10-CM | POA: Diagnosis not present

## 2019-03-01 DIAGNOSIS — M1991 Primary osteoarthritis, unspecified site: Secondary | ICD-10-CM | POA: Diagnosis not present

## 2019-03-01 DIAGNOSIS — R002 Palpitations: Secondary | ICD-10-CM | POA: Diagnosis not present

## 2019-03-01 DIAGNOSIS — K580 Irritable bowel syndrome with diarrhea: Secondary | ICD-10-CM | POA: Diagnosis not present

## 2019-03-01 DIAGNOSIS — Z79899 Other long term (current) drug therapy: Secondary | ICD-10-CM | POA: Diagnosis not present

## 2019-03-01 DIAGNOSIS — I110 Hypertensive heart disease with heart failure: Secondary | ICD-10-CM | POA: Diagnosis not present

## 2019-03-01 DIAGNOSIS — J449 Chronic obstructive pulmonary disease, unspecified: Secondary | ICD-10-CM | POA: Diagnosis not present

## 2019-03-01 DIAGNOSIS — H6693 Otitis media, unspecified, bilateral: Secondary | ICD-10-CM | POA: Diagnosis not present

## 2019-03-05 DIAGNOSIS — R300 Dysuria: Secondary | ICD-10-CM | POA: Diagnosis not present

## 2019-03-05 DIAGNOSIS — L02214 Cutaneous abscess of groin: Secondary | ICD-10-CM | POA: Diagnosis not present

## 2019-03-07 ENCOUNTER — Ambulatory Visit: Payer: Medicare Other | Admitting: Cardiology

## 2019-03-09 DIAGNOSIS — H548 Legal blindness, as defined in USA: Secondary | ICD-10-CM | POA: Diagnosis not present

## 2019-03-09 DIAGNOSIS — J449 Chronic obstructive pulmonary disease, unspecified: Secondary | ICD-10-CM | POA: Diagnosis not present

## 2019-03-09 DIAGNOSIS — I110 Hypertensive heart disease with heart failure: Secondary | ICD-10-CM | POA: Diagnosis not present

## 2019-03-09 DIAGNOSIS — Z79899 Other long term (current) drug therapy: Secondary | ICD-10-CM | POA: Diagnosis not present

## 2019-03-09 DIAGNOSIS — R262 Difficulty in walking, not elsewhere classified: Secondary | ICD-10-CM | POA: Diagnosis not present

## 2019-03-09 DIAGNOSIS — E538 Deficiency of other specified B group vitamins: Secondary | ICD-10-CM | POA: Diagnosis not present

## 2019-03-09 DIAGNOSIS — E1142 Type 2 diabetes mellitus with diabetic polyneuropathy: Secondary | ICD-10-CM | POA: Diagnosis not present

## 2019-03-09 DIAGNOSIS — M1991 Primary osteoarthritis, unspecified site: Secondary | ICD-10-CM | POA: Diagnosis not present

## 2019-03-09 DIAGNOSIS — Z85038 Personal history of other malignant neoplasm of large intestine: Secondary | ICD-10-CM | POA: Diagnosis not present

## 2019-03-09 DIAGNOSIS — Z87891 Personal history of nicotine dependence: Secondary | ICD-10-CM | POA: Diagnosis not present

## 2019-03-09 DIAGNOSIS — Z7982 Long term (current) use of aspirin: Secondary | ICD-10-CM | POA: Diagnosis not present

## 2019-03-09 DIAGNOSIS — H6693 Otitis media, unspecified, bilateral: Secondary | ICD-10-CM | POA: Diagnosis not present

## 2019-03-09 DIAGNOSIS — R002 Palpitations: Secondary | ICD-10-CM | POA: Diagnosis not present

## 2019-03-09 DIAGNOSIS — I5033 Acute on chronic diastolic (congestive) heart failure: Secondary | ICD-10-CM | POA: Diagnosis not present

## 2019-03-09 DIAGNOSIS — Z602 Problems related to living alone: Secondary | ICD-10-CM | POA: Diagnosis not present

## 2019-03-09 DIAGNOSIS — I951 Orthostatic hypotension: Secondary | ICD-10-CM | POA: Diagnosis not present

## 2019-03-09 DIAGNOSIS — E785 Hyperlipidemia, unspecified: Secondary | ICD-10-CM | POA: Diagnosis not present

## 2019-03-12 DIAGNOSIS — L0291 Cutaneous abscess, unspecified: Secondary | ICD-10-CM | POA: Diagnosis not present

## 2019-03-12 DIAGNOSIS — R197 Diarrhea, unspecified: Secondary | ICD-10-CM | POA: Diagnosis not present

## 2019-03-12 DIAGNOSIS — H9192 Unspecified hearing loss, left ear: Secondary | ICD-10-CM | POA: Diagnosis not present

## 2019-03-12 DIAGNOSIS — I509 Heart failure, unspecified: Secondary | ICD-10-CM | POA: Diagnosis not present

## 2019-03-12 DIAGNOSIS — Z6824 Body mass index (BMI) 24.0-24.9, adult: Secondary | ICD-10-CM | POA: Diagnosis not present

## 2019-03-13 DIAGNOSIS — R197 Diarrhea, unspecified: Secondary | ICD-10-CM | POA: Diagnosis not present

## 2019-03-22 DIAGNOSIS — M19012 Primary osteoarthritis, left shoulder: Secondary | ICD-10-CM | POA: Diagnosis not present

## 2019-03-22 DIAGNOSIS — S59902A Unspecified injury of left elbow, initial encounter: Secondary | ICD-10-CM | POA: Diagnosis not present

## 2019-03-22 DIAGNOSIS — S4992XA Unspecified injury of left shoulder and upper arm, initial encounter: Secondary | ICD-10-CM | POA: Diagnosis not present

## 2019-03-25 DIAGNOSIS — I5032 Chronic diastolic (congestive) heart failure: Secondary | ICD-10-CM | POA: Diagnosis not present

## 2019-03-25 DIAGNOSIS — Z8619 Personal history of other infectious and parasitic diseases: Secondary | ICD-10-CM | POA: Diagnosis not present

## 2019-03-25 DIAGNOSIS — Z8719 Personal history of other diseases of the digestive system: Secondary | ICD-10-CM | POA: Diagnosis not present

## 2019-03-25 DIAGNOSIS — M19012 Primary osteoarthritis, left shoulder: Secondary | ICD-10-CM | POA: Diagnosis not present

## 2019-03-25 DIAGNOSIS — Z7982 Long term (current) use of aspirin: Secondary | ICD-10-CM | POA: Diagnosis not present

## 2019-03-25 DIAGNOSIS — Z79899 Other long term (current) drug therapy: Secondary | ICD-10-CM | POA: Diagnosis not present

## 2019-03-25 DIAGNOSIS — Z85038 Personal history of other malignant neoplasm of large intestine: Secondary | ICD-10-CM | POA: Diagnosis not present

## 2019-03-25 DIAGNOSIS — Z602 Problems related to living alone: Secondary | ICD-10-CM | POA: Diagnosis not present

## 2019-03-25 DIAGNOSIS — S4992XD Unspecified injury of left shoulder and upper arm, subsequent encounter: Secondary | ICD-10-CM | POA: Diagnosis not present

## 2019-03-25 DIAGNOSIS — Z9181 History of falling: Secondary | ICD-10-CM | POA: Diagnosis not present

## 2019-03-25 DIAGNOSIS — K219 Gastro-esophageal reflux disease without esophagitis: Secondary | ICD-10-CM | POA: Diagnosis not present

## 2019-03-25 DIAGNOSIS — M25522 Pain in left elbow: Secondary | ICD-10-CM | POA: Diagnosis not present

## 2019-03-25 DIAGNOSIS — J449 Chronic obstructive pulmonary disease, unspecified: Secondary | ICD-10-CM | POA: Diagnosis not present

## 2019-03-25 DIAGNOSIS — E785 Hyperlipidemia, unspecified: Secondary | ICD-10-CM | POA: Diagnosis not present

## 2019-03-25 DIAGNOSIS — K580 Irritable bowel syndrome with diarrhea: Secondary | ICD-10-CM | POA: Diagnosis not present

## 2019-03-25 DIAGNOSIS — Z7902 Long term (current) use of antithrombotics/antiplatelets: Secondary | ICD-10-CM | POA: Diagnosis not present

## 2019-03-25 DIAGNOSIS — E538 Deficiency of other specified B group vitamins: Secondary | ICD-10-CM | POA: Diagnosis not present

## 2019-03-25 DIAGNOSIS — W19XXXD Unspecified fall, subsequent encounter: Secondary | ICD-10-CM | POA: Diagnosis not present

## 2019-03-25 DIAGNOSIS — I110 Hypertensive heart disease with heart failure: Secondary | ICD-10-CM | POA: Diagnosis not present

## 2019-03-25 DIAGNOSIS — Z87891 Personal history of nicotine dependence: Secondary | ICD-10-CM | POA: Diagnosis not present

## 2019-03-25 DIAGNOSIS — H548 Legal blindness, as defined in USA: Secondary | ICD-10-CM | POA: Diagnosis not present

## 2019-03-25 DIAGNOSIS — S59902D Unspecified injury of left elbow, subsequent encounter: Secondary | ICD-10-CM | POA: Diagnosis not present

## 2019-03-27 ENCOUNTER — Other Ambulatory Visit: Payer: Self-pay | Admitting: Cardiology

## 2019-03-28 DIAGNOSIS — Z85038 Personal history of other malignant neoplasm of large intestine: Secondary | ICD-10-CM | POA: Diagnosis not present

## 2019-03-28 DIAGNOSIS — I110 Hypertensive heart disease with heart failure: Secondary | ICD-10-CM | POA: Diagnosis not present

## 2019-03-28 DIAGNOSIS — Z8719 Personal history of other diseases of the digestive system: Secondary | ICD-10-CM | POA: Diagnosis not present

## 2019-03-28 DIAGNOSIS — J449 Chronic obstructive pulmonary disease, unspecified: Secondary | ICD-10-CM | POA: Diagnosis not present

## 2019-03-28 DIAGNOSIS — K219 Gastro-esophageal reflux disease without esophagitis: Secondary | ICD-10-CM | POA: Diagnosis not present

## 2019-03-28 DIAGNOSIS — S59902D Unspecified injury of left elbow, subsequent encounter: Secondary | ICD-10-CM | POA: Diagnosis not present

## 2019-03-28 DIAGNOSIS — K580 Irritable bowel syndrome with diarrhea: Secondary | ICD-10-CM | POA: Diagnosis not present

## 2019-03-28 DIAGNOSIS — W19XXXD Unspecified fall, subsequent encounter: Secondary | ICD-10-CM | POA: Diagnosis not present

## 2019-03-28 DIAGNOSIS — I5032 Chronic diastolic (congestive) heart failure: Secondary | ICD-10-CM | POA: Diagnosis not present

## 2019-03-28 DIAGNOSIS — M19012 Primary osteoarthritis, left shoulder: Secondary | ICD-10-CM | POA: Diagnosis not present

## 2019-03-28 DIAGNOSIS — Z87891 Personal history of nicotine dependence: Secondary | ICD-10-CM | POA: Diagnosis not present

## 2019-03-28 DIAGNOSIS — S4992XD Unspecified injury of left shoulder and upper arm, subsequent encounter: Secondary | ICD-10-CM | POA: Diagnosis not present

## 2019-03-28 DIAGNOSIS — Z8619 Personal history of other infectious and parasitic diseases: Secondary | ICD-10-CM | POA: Diagnosis not present

## 2019-03-28 DIAGNOSIS — M25522 Pain in left elbow: Secondary | ICD-10-CM | POA: Diagnosis not present

## 2019-03-28 DIAGNOSIS — Z79899 Other long term (current) drug therapy: Secondary | ICD-10-CM | POA: Diagnosis not present

## 2019-03-28 DIAGNOSIS — Z7982 Long term (current) use of aspirin: Secondary | ICD-10-CM | POA: Diagnosis not present

## 2019-03-28 DIAGNOSIS — H548 Legal blindness, as defined in USA: Secondary | ICD-10-CM | POA: Diagnosis not present

## 2019-03-28 DIAGNOSIS — E538 Deficiency of other specified B group vitamins: Secondary | ICD-10-CM | POA: Diagnosis not present

## 2019-03-28 DIAGNOSIS — Z9181 History of falling: Secondary | ICD-10-CM | POA: Diagnosis not present

## 2019-03-28 DIAGNOSIS — Z7902 Long term (current) use of antithrombotics/antiplatelets: Secondary | ICD-10-CM | POA: Diagnosis not present

## 2019-03-28 DIAGNOSIS — Z602 Problems related to living alone: Secondary | ICD-10-CM | POA: Diagnosis not present

## 2019-03-28 DIAGNOSIS — E785 Hyperlipidemia, unspecified: Secondary | ICD-10-CM | POA: Diagnosis not present

## 2019-03-30 DIAGNOSIS — H353132 Nonexudative age-related macular degeneration, bilateral, intermediate dry stage: Secondary | ICD-10-CM | POA: Diagnosis not present

## 2019-04-02 ENCOUNTER — Ambulatory Visit (INDEPENDENT_AMBULATORY_CARE_PROVIDER_SITE_OTHER): Payer: Medicare Other | Admitting: Cardiology

## 2019-04-02 ENCOUNTER — Encounter: Payer: Self-pay | Admitting: Cardiology

## 2019-04-02 ENCOUNTER — Other Ambulatory Visit: Payer: Self-pay

## 2019-04-02 VITALS — BP 146/88 | HR 58 | Ht 62.0 in | Wt 132.0 lb

## 2019-04-02 DIAGNOSIS — Z743 Need for continuous supervision: Secondary | ICD-10-CM | POA: Diagnosis not present

## 2019-04-02 DIAGNOSIS — Z01812 Encounter for preprocedural laboratory examination: Secondary | ICD-10-CM

## 2019-04-02 DIAGNOSIS — E119 Type 2 diabetes mellitus without complications: Secondary | ICD-10-CM

## 2019-04-02 DIAGNOSIS — E782 Mixed hyperlipidemia: Secondary | ICD-10-CM

## 2019-04-02 DIAGNOSIS — R079 Chest pain, unspecified: Secondary | ICD-10-CM | POA: Diagnosis not present

## 2019-04-02 DIAGNOSIS — R42 Dizziness and giddiness: Secondary | ICD-10-CM | POA: Diagnosis not present

## 2019-04-02 DIAGNOSIS — R0789 Other chest pain: Secondary | ICD-10-CM | POA: Diagnosis not present

## 2019-04-02 DIAGNOSIS — R072 Precordial pain: Secondary | ICD-10-CM

## 2019-04-02 DIAGNOSIS — R0602 Shortness of breath: Secondary | ICD-10-CM | POA: Diagnosis not present

## 2019-04-02 DIAGNOSIS — I10 Essential (primary) hypertension: Secondary | ICD-10-CM

## 2019-04-02 DIAGNOSIS — R55 Syncope and collapse: Secondary | ICD-10-CM | POA: Diagnosis not present

## 2019-04-02 MED ORDER — ISOSORBIDE MONONITRATE ER 30 MG PO TB24: 30.0000 mg | ORAL_TABLET | Freq: Every day | ORAL | 1 refills | Status: DC

## 2019-04-02 NOTE — Progress Notes (Addendum)
Cardiology Office Note:    Date:  04/02/2019   ID:  Heather Mccoy, DOB 09-21-1945, MRN [id number]  PCP:  Ernestene Kiel, MD  Cardiologist:  Berniece Salines, DO  Electrophysiologist:  None   Referring MD: Rochel Brome, MD   Chief Complaint  Patient presents with  . Follow-up  . Chest Pain    last night    History of Present Illness:    Heather Mccoy is a 73 y.o. female with a hx of diabetes mellitus, colon cancer status post surgery, chronic diarrhea, anxiety, COPD not on oxygen, tobacco use, hypertension and hyperlipidemiapresents for follow-up visit.     I initially saw the patient on  January 19, 2019 at which time she was posthospitalization. She was hospitalized at [facility] in August where she was treated for syncope due to orthostatic hypotension. Her medications were optimized which included stopping her diuretics and she was started on midodrine and Florinef.On completion of our visit given her elevated blood pressure I did advise patient to only take her midodrine 5 mg 3 times daily if her systolic blood pressure was less than 120. Since her visit she has not needed to take the midodrine. She was also given a ZIO patch which she is still wearing to complete a 14-day monitoring. She was continued on the fludrocortisone 0.1 mg daily  The patient was seen on 02/05/2019. During that visit she was hypertensive and was still on fludrocortisone 0.1 mg daily.  Started to titrate off the fludrocortisone. In addition the patient at the time did not want to start any antihypertensive medication.  She was still wearing her Zio patch during the time of her visit.  At her last visit February 28, 2019 patient did have evidence of paroxysmal atrial tachycardia she was started on carvedilol 6.25 mg twice daily.  Today she tells me that she has had intermittent chest pain.  She took nitroglycerin which made her vomit yesterday when she had chest pain she did not take any  nitroglycerin.  She describes her chest pain as a left-sided burning sensation which at times radiates to her left arm and causes of arm numbness. She denies chest pain in the office today.   Past Medical History:  Diagnosis Date  . Anxiety   . Arthritis   . Cancer ([place])    colon  . COPD (chronic obstructive pulmonary disease) ([place])   . Diabetes mellitus   . Dysrhythmia   . Hyperlipidemia   . Hypertension   . Mood disorder ([place])   . Orthostatic hypotension   . Shortness of breath     Past Surgical History:  Procedure Laterality Date  . APPENDECTOMY    . CHOLECYSTECTOMY    . COLON SURGERY    . EUS  01/12/2012   Procedure: UPPER ENDOSCOPIC ULTRASOUND (EUS) RADIAL;  Surgeon: Arta Silence, MD;  Location: [facility];  Service: Endoscopy;  Laterality: N/A;  to be admitted after  . REPLACEMENT TOTAL KNEE      Current Medications: Current Meds  Medication Sig  . Albuterol Sulfate 2.5 MG/0.5ML NEBU Inhale 2.5 mg into the lungs 3 (three) times daily as needed.  Marland Kitchen alendronate (FOSAMAX) 70 MG tablet Take 70 mg by mouth once a week. Take with a full glass of water on an empty stomach.  Marland Kitchen aspirin EC 81 MG tablet Take 81 mg by mouth daily.  Marland Kitchen atorvastatin (LIPITOR) 40 MG tablet Take 40 mg by mouth daily.  . carvedilol (COREG) 6.25 MG tablet TAKE  1 TABLET(6.25 MG) BY MOUTH TWICE DAILY  . citalopram (CELEXA) 10 MG tablet Take 10 mg by mouth daily.  . clopidogrel (PLAVIX) 75 MG tablet Take 75 mg by mouth daily.  . diclofenac sodium (VOLTAREN) 1 % GEL Apply 2 g topically 4 (four) times daily.  Marland Kitchen dicyclomine (BENTYL) 10 MG capsule Take 1 capsule by mouth 3 (three) times daily before meals.   . famotidine (PEPCID) 40 MG tablet Take 40 mg by mouth 2 (two) times daily.  . hydrALAZINE (APRESOLINE) 50 MG tablet Take 50 mg by mouth 2 (two) times daily with a meal.  . hydrOXYzine (ATARAX/VISTARIL) 25 MG tablet Take 25 mg by mouth 3 (three) times daily as needed.  . loperamide (IMODIUM) 2 MG  capsule Take 2 mg by mouth every 6 (six) hours as needed for diarrhea or loose stools.  Marland Kitchen LORazepam (ATIVAN) 0.5 MG tablet Take 1 mg by mouth daily as needed.   . meclizine (ANTIVERT) 25 MG tablet Take 25 mg by mouth every 6 (six) hours as needed for dizziness.  . midodrine (PROAMATINE) 5 MG tablet Take 5 mg by mouth 3 (three) times daily as needed.  . montelukast (SINGULAIR) 10 MG tablet Take 10 mg by mouth at bedtime.  . Naproxen Sodium (ALEVE) 220 MG CAPS Take 1 capsule by mouth every 6 (six) hours as needed.  . nitroGLYCERIN (NITROSTAT) 0.4 MG SL tablet Place 1 tablet (0.4 mg total) under the tongue every 5 (five) minutes as needed for chest pain.  . pantoprazole (PROTONIX) 40 MG tablet Take 40 mg by mouth daily.  . potassium chloride SA (KLOR-CON) 20 MEQ tablet Take 20 mEq by mouth daily.  . promethazine (PHENERGAN) 25 MG tablet Take 1 tablet by mouth 3 (three) times daily as needed.  . traMADol (ULTRAM) 50 MG tablet Take 50 mg by mouth every 6 (six) hours as needed.  . [DISCONTINUED] diphenoxylate-atropine (LOMOTIL) 2.5-0.025 MG tablet Take 1 tablet by mouth 4 (four) times daily.     Allergies:   Codeine, Morphine, Tylenol [acetaminophen], and Oxycodone   Social History   Socioeconomic History  . Marital status: Widowed    Spouse name: Not on file  . Number of children: Not on file  . Years of education: Not on file  . Highest education level: Not on file  Occupational History  . Not on file  Social Needs  . Financial resource strain: Not on file  . Food insecurity    Worry: Not on file    Inability: Not on file  . Transportation needs    Medical: Not on file    Non-medical: Not on file  Tobacco Use  . Smoking status: Current Every Day Smoker    Packs/day: 0.50    Types: Cigarettes  . Smokeless tobacco: Never Used  Substance and Sexual Activity  . Alcohol use: No  . Drug use: No  . Sexual activity: Never  Lifestyle  . Physical activity    Days per week: Not on file     Minutes per session: Not on file  . Stress: Not on file  Relationships  . Social Herbalist on phone: Not on file    Gets together: Not on file    Attends religious service: Not on file    Active member of club or organization: Not on file    Attends meetings of clubs or organizations: Not on file    Relationship status: Not on file  Other Topics Concern  .  Not on file  Social History Narrative  . Not on file     Family History: The patient's family history includes CVA in her father; Cerebral aneurysm in her mother; Heart attack in her father and sister.  ROS:   Review of Systems  Constitution: Negative for decreased appetite, fever and weight gain.  HENT: Negative for congestion, ear discharge, hoarse voice and sore throat.   Eyes: Negative for discharge, redness, vision loss in right eye and visual halos.  Cardiovascular: Reports chest pain, negative for dyspnea on exertion, leg swelling, orthopnea and palpitations.  Respiratory: Negative for cough, hemoptysis, shortness of breath and snoring.   Endocrine: Negative for heat intolerance and polyphagia.  Hematologic/Lymphatic: Negative for bleeding problem. Does not bruise/bleed easily.  Skin: Negative for flushing, nail changes, rash and suspicious lesions.  Musculoskeletal: Negative for arthritis, joint pain, muscle cramps, myalgias, neck pain and stiffness.  Gastrointestinal: Negative for abdominal pain, bowel incontinence, diarrhea and excessive appetite.  Genitourinary: Negative for decreased libido, genital sores and incomplete emptying.  Neurological: Negative for brief paralysis, focal weakness, headaches and loss of balance.  Psychiatric/Behavioral: Negative for altered mental status, depression and suicidal ideas.  Allergic/Immunologic: Negative for HIV exposure and persistent infections.    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  Recent Labs: 01/26/2019: BUN 17; Creatinine, Ser 0.71; Magnesium 1.8; Potassium 4.8; Sodium 137  Recent Lipid Panel No results found for: CHOL, TRIG, HDL, CHOLHDL, VLDL, LDLCALC, LDLDIRECT  Physical Exam:    VS:  BP (!) 146/88 (BP Location: Left Arm, Patient Position: Sitting, Cuff Size: Normal)   Pulse (!) 58   Ht 5\' 2"  (1.575 m)   Wt 132 lb (59.9 kg)   SpO2 98%   BMI 24.14 kg/m     Wt Readings from Last 3 Encounters:  04/02/19 132 lb (59.9 kg)  02/28/19 130 lb 9.6 oz (59.2 kg)  02/05/19 132 lb 9.6 oz (60.1 kg)     GEN: Well nourished, well developed in no acute distress HEENT: Normal NECK: No JVD; No carotid bruits LYMPHATICS: No lymphadenopathy CARDIAC: S1S2 noted,RRR, no murmurs, rubs, gallops RESPIRATORY:  Clear to auscultation without rales, wheezing or rhonchi  ABDOMEN: Soft, non-tender, non-distended, +bowel sounds, no guarding. EXTREMITIES: No edema, No cyanosis, no clubbing MUSCULOSKELETAL:  No edema; No deformity  SKIN: Warm and dry NEUROLOGIC:  Alert and oriented x 3, non-focal PSYCHIATRIC:  Normal affect, good insight  ASSESSMENT:    1. Pre-procedure lab exam   2. Precordial pain   3. Essential hypertension   4. Type 2 diabetes mellitus without complication, without long-term current use of insulin ([place])   5. Mixed hyperlipidemia    PLAN:    1.  Chest pain- although her recent pharmacologic stress test in  hospital in 2019 was normal at this time I am going to get a CT of the coronaries to make sure that there is no evidence of any coronary artery disease.  In addition I will be starting patient on Imdur 30 mg daily.   2.  Hypertension-blood pressure is 146/88 in the office today.  Not quite at target but with addition of Imdur 30 mg daily and hoping that this will help her blood pressure.  At the end of the visit as the patient  has been walked to to be  checked out she had an episode of dizziness near syncope and needed to be added to the floor.  She did not have any transient loss of consciousness.  911 was called and patient is going to be sent to the emergency department.  The patient is in agreement with the above plan. The patient left the office in stable condition.  The patient will follow up in 1 month.   Medication Adjustments/Labs and Tests Ordered: Current medicines are reviewed at length with the patient today.  Concerns regarding medicines are outlined above.   Orders Placed This Encounter  Procedures  . CT CORONARY FRACTIONAL FLOW RESERVE DATA PREP  . CT CORONARY FRACTIONAL FLOW RESERVE FLUID ANALYSIS  . CT CORONARY MORPH W/CTA COR W/SCORE W/CA W/CM &/OR WO/CM  . Basic Metabolic Panel (BMET)  . EKG 12-Lead   Meds ordered this encounter  Medications  . isosorbide mononitrate (IMDUR) 30 MG 24 hr tablet    Sig: Take 1 tablet (30 mg total) by mouth daily.    Dispense:  90 tablet    Refill:  1    Patient Instructions  Medication Instructions:  Your physician has recommended you make the following change in your medication:   Start : Imdur (isosorbide) 30 mg Take 1 tab daily  *If you need a refill on your cardiac medications before your next appointment, please call your pharmacy*  Lab Work: Your physician recommends that you return for lab work in:   3-7 days prior to CT:BMP  If you have labs (blood work) drawn today and your tests are completely normal, you will receive your results only by: Marland Kitchen MyChart Message (if you have MyChart) OR . A paper copy in the mail If you have any lab test that is abnormal or we need to change your treatment, we will call you to review the results.  Testing/Procedures: Your physician has requested that you have cardiac CT. Cardiac computed tomography (CT) is a painless test that uses an x-ray machine to take clear, detailed pictures of your heart. For further  information please visit [email or web address]. Please follow instruction sheet as given.  Your cardiac CT will be scheduled at one of the below locations:   [facility] [address] [place],  [id number] [phone]  If scheduled at [facility], please arrive at the [facility] main entrance of [facility] 30-45 minutes prior to test start time. Proceed to the [facility] Radiology Department (first floor) to check-in and test prep.  Please follow these instructions carefully (unless otherwise directed):  On the Night Before the Test: . Be sure to Drink plenty of water. . Do not consume any caffeinated/decaffeinated beverages or chocolate 12 hours prior to your test. . Do not take any antihistamines 12 hours prior to your test.  On the Day of the Test: . Drink plenty of water. Do not drink any water within one hour of the test. . Do not eat any food 4 hours prior to the test. . You may take your regular medications prior to the test.  . Take carvedilol (Coreg) two hours prior to test.               After the Test: . Drink plenty of water. . After receiving IV contrast, you may experience a mild flushed feeling. This is normal. . On occasion, you may experience a mild rash up to 24 hours after the test. This is not dangerous. If this occurs, you can take  Benadryl 25 mg and increase your fluid intake. . If you experience trouble breathing, this can be serious. If it is severe call 911 IMMEDIATELY. If it is mild, please call our office.  Once we have confirmed authorization from your insurance company, we will call you to set up a date and time for your test.   For non-scheduling related questions, please contact the cardiac imaging nurse navigator should you have any questions/concerns: Marchia Bond, RN Navigator Cardiac Imaging [facility] [phone] Office    Follow-Up: At [facility], you and your health  needs are our priority.  As part of our continuing mission to provide you with exceptional heart care, we have created designated Provider Care Teams.  These Care Teams include your primary Cardiologist (physician) and Advanced Practice Providers (APPs -  Physician Assistants and Nurse Practitioners) who all work together to provide you with the care you need, when you need it.  Your next appointment:   3 months  The format for your next appointment:   In Person  Provider:   Berniece Salines, DO  Other Instructions  Cardiac CT Angiogram  A cardiac CT angiogram is a procedure to look at the heart and the area around the heart. It may be done to help find the cause of chest pains or other symptoms of heart disease. During this procedure, a large X-ray machine, called a CT scanner, takes detailed pictures of the heart and the surrounding area after a dye (contrast material) has been injected into blood vessels in the area. The procedure is also sometimes called a coronary CT angiogram, coronary artery scanning, or CTA. A cardiac CT angiogram allows the health care provider to see how well blood is flowing to and from the heart. The health care provider will be able to see if there are any problems, such as:  Blockage or narrowing of the coronary arteries in the heart.  Fluid around the heart.  Signs of weakness or disease in the muscles, valves, and tissues of the heart. Tell a health care provider about:  Any allergies you have. This is especially important if you have had a previous allergic reaction to contrast dye.  All medicines you are taking, including vitamins, herbs, eye drops, creams, and over-the-counter medicines.  Any blood disorders you have.  Any surgeries you have had.  Any medical conditions you have.  Whether you are pregnant or may be pregnant.  Any anxiety disorders, chronic pain, or other conditions you have that may increase your stress or prevent you from lying  still. What are the risks? Generally, this is a safe procedure. However, problems may occur, including:  Bleeding.  Infection.  Allergic reactions to medicines or dyes.  Damage to other structures or organs.  Kidney damage from the dye or contrast that is used.  Increased risk of cancer from radiation exposure. This risk is low. Talk with your health care provider about: ? The risks and benefits of testing. ? How you can receive the lowest dose of radiation. What happens before the procedure?  Wear comfortable clothing and remove any jewelry, glasses, dentures, and hearing aids.  Follow instructions from your health care provider about eating and drinking. This may include: ? For 12 hours before the test - avoid caffeine. This includes tea, coffee, soda, energy drinks, and diet pills. Drink plenty of water or other fluids that do not have caffeine in them. Being well-hydrated can prevent complications. ? For 4-6 hours before the  test - stop eating and drinking. The contrast dye can cause nausea, but this is less likely if your stomach is empty.  Ask your health care provider about changing or stopping your regular medicines. This is especially important if you are taking diabetes medicines, blood thinners, or medicines to treat erectile dysfunction. What happens during the procedure?  Hair on your chest may need to be removed so that small sticky patches called electrodes can be placed on your chest. These will transmit information that helps to monitor your heart during the test.  An IV tube will be inserted into one of your veins.  You might be given a medicine to control your heart rate during the test. This will help to ensure that good images are obtained.  You will be asked to lie on an exam table. This table will slide in and out of the CT machine during the procedure.  Contrast dye will be injected into the IV tube. You might feel warm, or you may get a metallic taste in  your mouth.  You will be given a medicine (nitroglycerin) to relax (dilate) the arteries in your heart.  The table that you are lying on will move into the CT machine tunnel for the scan.  The person running the machine will give you instructions while the scans are being done. You may be asked to: ? Keep your arms above your head. ? Hold your breath. ? Stay very still, even if the table is moving.  When the scanning is complete, you will be moved out of the machine.  The IV tube will be removed. The procedure may vary among health care providers and hospitals. What happens after the procedure?  You might feel warm, or you may get a metallic taste in your mouth from the contrast dye.  You may have a headache from the nitroglycerin.  After the procedure, drink water or other fluids to wash (flush) the contrast material out of your body.  Contact a health care provider if you have any symptoms of allergy to the contrast. These symptoms include: ? Shortness of breath. ? Rash or hives. ? A racing heartbeat.  Most people can return to their normal activities right after the procedure. Ask your health care provider what activities are safe for you.  It is up to you to get the results of your procedure. Ask your health care provider, or the department that is doing the procedure, when your results will be ready. Summary  A cardiac CT angiogram is a procedure to look at the heart and the area around the heart. It may be done to help find the cause of chest pains or other symptoms of heart disease.  During this procedure, a large X-ray machine, called a CT scanner, takes detailed pictures of the heart and the surrounding area after a dye (contrast material) has been injected into blood vessels in the area.  Ask your health care provider about changing or stopping your regular medicines before the procedure. This is especially important if you are taking diabetes medicines, blood thinners,  or medicines to treat erectile dysfunction.  After the procedure, drink water or other fluids to wash (flush) the contrast material out of your body. This information is not intended to replace advice given to you by your health care provider. Make sure you discuss any questions you have with your health care provider. Document Released: 04/15/2008 Document Revised: 04/15/2017 Document Reviewed: 03/22/2016 Elsevier Patient Education  2020 [employer].  Adopting a Healthy Lifestyle.  Know what a healthy weight is for you (roughly BMI <25) and aim to maintain this   Aim for 7+ servings of fruits and vegetables daily   65-80+ fluid ounces of water or unsweet tea for healthy kidneys   Limit to max 1 drink of alcohol per day; avoid smoking/tobacco   Limit animal fats in diet for cholesterol and heart health - choose grass fed whenever available   Avoid highly processed foods, and foods high in saturated/trans fats   Aim for low stress - take time to unwind and care for your mental health   Aim for 150 min of moderate intensity exercise weekly for heart health, and weights twice weekly for bone health   Aim for 7-9 hours of sleep daily   When it comes to diets, agreement about the perfect plan isnt easy to find, even among the experts. Experts at the [employer] developed an idea known as the Healthy Eating Plate. Just imagine a plate divided into logical, healthy portions.   The emphasis is on diet quality:   Load up on vegetables and fruits - one-half of your plate: Aim for color and variety, and remember that potatoes dont count.   Go for whole grains - one-quarter of your plate: Whole wheat, barley, wheat berries, quinoa, oats, brown rice, and foods made with them. If you want pasta, go with whole wheat pasta.   Protein power - one-quarter of your plate: Fish, chicken, beans, and nuts are all healthy, versatile protein sources. Limit red meat.   The  diet, however, does go beyond the plate, offering a few other suggestions.   Use healthy plant oils, such as olive, canola, soy, corn, sunflower and peanut. Check the labels, and avoid partially hydrogenated oil, which have unhealthy trans fats.   If youre thirsty, drink water. Coffee and tea are good in moderation, but skip sugary drinks and limit milk and dairy products to one or two daily servings.   The type of carbohydrate in the diet is more important than the amount. Some sources of carbohydrates, such as vegetables, fruits, whole grains, and beans-are healthier than others.   Finally, stay active  Signed, Berniece Salines, DO  04/02/2019 12:29 PM    [place]

## 2019-04-02 NOTE — Patient Instructions (Signed)
Medication Instructions:  Your physician has recommended you make the following change in your medication:   Start : Imdur (isosorbide) 30 mg Take 1 tab daily  *If you need a refill on your cardiac medications before your next appointment, please call your pharmacy*  Lab Work: Your physician recommends that you return for lab work in:   3-7 days prior to CT:BMP  If you have labs (blood work) drawn today and your tests are completely normal, you will receive your results only by: Marland Kitchen MyChart Message (if you have MyChart) OR . A paper copy in the mail If you have any lab test that is abnormal or we need to change your treatment, we will call you to review the results.  Testing/Procedures: Your physician has requested that you have cardiac CT. Cardiac computed tomography (CT) is a painless test that uses an x-ray machine to take clear, detailed pictures of your heart. For further information please visit [email or web address]. Please follow instruction sheet as given.  Your cardiac CT will be scheduled at one of the below locations:   [facility] [address] [place], [place] [address] [phone]  If scheduled at [facility], please arrive at the [facility] main entrance of [facility] 30-45 minutes prior to test start time. Proceed to the [facility] Radiology Department (first floor) to check-in and test prep.  Please follow these instructions carefully (unless otherwise directed):  On the Night Before the Test: . Be sure to Drink plenty of water. . Do not consume any caffeinated/decaffeinated beverages or chocolate 12 hours prior to your test. . Do not take any antihistamines 12 hours prior to your test.  On the Day of the Test: . Drink plenty of water. Do not drink any water within one hour of the test. . Do not eat any food 4 hours prior to the test. . You may take your regular medications prior to the test.  . Take carvedilol (Coreg) two hours  prior to test.               After the Test: . Drink plenty of water. . After receiving IV contrast, you may experience a mild flushed feeling. This is normal. . On occasion, you may experience a mild rash up to 24 hours after the test. This is not dangerous. If this occurs, you can take Benadryl 25 mg and increase your fluid intake. . If you experience trouble breathing, this can be serious. If it is severe call 911 IMMEDIATELY. If it is mild, please call our office.  Once we have confirmed authorization from your insurance company, we will call you to set up a date and time for your test.   For non-scheduling related questions, please contact the cardiac imaging nurse navigator should you have any questions/concerns: Marchia Bond, RN Navigator Cardiac Imaging [facility] [phone] Office    Follow-Up: At [facility], you and your health needs are our priority.  As part of our continuing mission to provide you with exceptional heart care, we have created designated Provider Care Teams.  These Care Teams include your primary Cardiologist (physician) and Advanced Practice Providers (APPs -  Physician Assistants and Nurse Practitioners) who all work together to provide you with the care you need, when you need it.  Your next appointment:   3 months  The format for your next appointment:   In Person  Provider:   Berniece Salines, DO  Other Instructions  Cardiac CT Angiogram  A cardiac CT angiogram is a procedure to look at the heart and the area around the heart. It may be done to help find the cause of chest pains or other symptoms of heart disease. During this procedure, a large X-ray machine, called a CT scanner, takes detailed pictures of the heart and the surrounding area after a dye (contrast material) has been injected into blood vessels in the area. The procedure is also sometimes called a coronary CT angiogram, coronary artery scanning, or CTA. A  cardiac CT angiogram allows the health care provider to see how well blood is flowing to and from the heart. The health care provider will be able to see if there are any problems, such as:  Blockage or narrowing of the coronary arteries in the heart.  Fluid around the heart.  Signs of weakness or disease in the muscles, valves, and tissues of the heart. Tell a health care provider about:  Any allergies you have. This is especially important if you have had a previous allergic reaction to contrast dye.  All medicines you are taking, including vitamins, herbs, eye drops, creams, and over-the-counter medicines.  Any blood disorders you have.  Any surgeries you have had.  Any medical conditions you have.  Whether you are pregnant or may be pregnant.  Any anxiety disorders, chronic pain, or other conditions you have that may increase your stress or prevent you from lying still. What are the risks? Generally, this is a safe procedure. However, problems may occur, including:  Bleeding.  Infection.  Allergic reactions to medicines or dyes.  Damage to other structures or organs.  Kidney damage from the dye or contrast that is used.  Increased risk of cancer from radiation exposure. This risk is low. Talk with your health care provider about: ? The risks and benefits of testing. ? How you can receive the lowest dose of radiation. What happens before the procedure?  Wear comfortable clothing and remove any jewelry, glasses, dentures, and hearing aids.  Follow instructions from your health care provider about eating and drinking. This may include: ? For 12 hours before the test - avoid caffeine. This includes tea, coffee, soda, energy drinks, and diet pills. Drink plenty of water or other fluids that do not have caffeine in them. Being well-hydrated can prevent complications. ? For 4-6 hours before the test - stop eating and drinking. The contrast dye can cause nausea, but this is  less likely if your stomach is empty.  Ask your health care provider about changing or stopping your regular medicines. This is especially important if you are taking diabetes medicines, blood thinners, or medicines to treat erectile dysfunction. What happens during the procedure?  Hair on your chest may need to be removed so that small sticky patches called electrodes can be placed on your chest. These will transmit information that helps to monitor your heart during the test.  An IV tube will be inserted into one of your veins.  You might be given a medicine to control your heart rate during the test. This will help to ensure that good images are obtained.  You will be asked to lie on an exam table. This table will slide in and out of the CT machine during the procedure.  Contrast dye will be injected into the IV tube. You might feel warm, or you may get a metallic taste in your mouth.  You will be given a medicine (nitroglycerin) to relax (dilate) the  arteries in your heart.  The table that you are lying on will move into the CT machine tunnel for the scan.  The person running the machine will give you instructions while the scans are being done. You may be asked to: ? Keep your arms above your head. ? Hold your breath. ? Stay very still, even if the table is moving.  When the scanning is complete, you will be moved out of the machine.  The IV tube will be removed. The procedure may vary among health care providers and hospitals. What happens after the procedure?  You might feel warm, or you may get a metallic taste in your mouth from the contrast dye.  You may have a headache from the nitroglycerin.  After the procedure, drink water or other fluids to wash (flush) the contrast material out of your body.  Contact a health care provider if you have any symptoms of allergy to the contrast. These symptoms include: ? Shortness of breath. ? Rash or hives. ? A racing heartbeat.   Most people can return to their normal activities right after the procedure. Ask your health care provider what activities are safe for you.  It is up to you to get the results of your procedure. Ask your health care provider, or the department that is doing the procedure, when your results will be ready. Summary  A cardiac CT angiogram is a procedure to look at the heart and the area around the heart. It may be done to help find the cause of chest pains or other symptoms of heart disease.  During this procedure, a large X-ray machine, called a CT scanner, takes detailed pictures of the heart and the surrounding area after a dye (contrast material) has been injected into blood vessels in the area.  Ask your health care provider about changing or stopping your regular medicines before the procedure. This is especially important if you are taking diabetes medicines, blood thinners, or medicines to treat erectile dysfunction.  After the procedure, drink water or other fluids to wash (flush) the contrast material out of your body. This information is not intended to replace advice given to you by your health care provider. Make sure you discuss any questions you have with your health care provider. Document Released: 04/15/2008 Document Revised: 04/15/2017 Document Reviewed: 03/22/2016 Elsevier Patient Education  2020 [employer].

## 2019-04-05 DIAGNOSIS — Z8719 Personal history of other diseases of the digestive system: Secondary | ICD-10-CM | POA: Diagnosis not present

## 2019-04-05 DIAGNOSIS — I110 Hypertensive heart disease with heart failure: Secondary | ICD-10-CM | POA: Diagnosis not present

## 2019-04-05 DIAGNOSIS — E538 Deficiency of other specified B group vitamins: Secondary | ICD-10-CM | POA: Diagnosis not present

## 2019-04-05 DIAGNOSIS — Z7982 Long term (current) use of aspirin: Secondary | ICD-10-CM | POA: Diagnosis not present

## 2019-04-05 DIAGNOSIS — M25522 Pain in left elbow: Secondary | ICD-10-CM | POA: Diagnosis not present

## 2019-04-05 DIAGNOSIS — I5032 Chronic diastolic (congestive) heart failure: Secondary | ICD-10-CM | POA: Diagnosis not present

## 2019-04-05 DIAGNOSIS — Z87891 Personal history of nicotine dependence: Secondary | ICD-10-CM | POA: Diagnosis not present

## 2019-04-05 DIAGNOSIS — Z79899 Other long term (current) drug therapy: Secondary | ICD-10-CM | POA: Diagnosis not present

## 2019-04-05 DIAGNOSIS — Z8619 Personal history of other infectious and parasitic diseases: Secondary | ICD-10-CM | POA: Diagnosis not present

## 2019-04-05 DIAGNOSIS — E785 Hyperlipidemia, unspecified: Secondary | ICD-10-CM | POA: Diagnosis not present

## 2019-04-05 DIAGNOSIS — W19XXXD Unspecified fall, subsequent encounter: Secondary | ICD-10-CM | POA: Diagnosis not present

## 2019-04-05 DIAGNOSIS — K580 Irritable bowel syndrome with diarrhea: Secondary | ICD-10-CM | POA: Diagnosis not present

## 2019-04-05 DIAGNOSIS — J449 Chronic obstructive pulmonary disease, unspecified: Secondary | ICD-10-CM | POA: Diagnosis not present

## 2019-04-05 DIAGNOSIS — M19012 Primary osteoarthritis, left shoulder: Secondary | ICD-10-CM | POA: Diagnosis not present

## 2019-04-05 DIAGNOSIS — Z85038 Personal history of other malignant neoplasm of large intestine: Secondary | ICD-10-CM | POA: Diagnosis not present

## 2019-04-05 DIAGNOSIS — Z9181 History of falling: Secondary | ICD-10-CM | POA: Diagnosis not present

## 2019-04-05 DIAGNOSIS — H548 Legal blindness, as defined in USA: Secondary | ICD-10-CM | POA: Diagnosis not present

## 2019-04-05 DIAGNOSIS — S59902D Unspecified injury of left elbow, subsequent encounter: Secondary | ICD-10-CM | POA: Diagnosis not present

## 2019-04-05 DIAGNOSIS — S4992XD Unspecified injury of left shoulder and upper arm, subsequent encounter: Secondary | ICD-10-CM | POA: Diagnosis not present

## 2019-04-05 DIAGNOSIS — Z602 Problems related to living alone: Secondary | ICD-10-CM | POA: Diagnosis not present

## 2019-04-05 DIAGNOSIS — K219 Gastro-esophageal reflux disease without esophagitis: Secondary | ICD-10-CM | POA: Diagnosis not present

## 2019-04-05 DIAGNOSIS — Z7902 Long term (current) use of antithrombotics/antiplatelets: Secondary | ICD-10-CM | POA: Diagnosis not present

## 2019-04-06 DIAGNOSIS — S59902D Unspecified injury of left elbow, subsequent encounter: Secondary | ICD-10-CM | POA: Diagnosis not present

## 2019-04-06 DIAGNOSIS — K219 Gastro-esophageal reflux disease without esophagitis: Secondary | ICD-10-CM | POA: Diagnosis not present

## 2019-04-06 DIAGNOSIS — Z87891 Personal history of nicotine dependence: Secondary | ICD-10-CM | POA: Diagnosis not present

## 2019-04-06 DIAGNOSIS — E785 Hyperlipidemia, unspecified: Secondary | ICD-10-CM | POA: Diagnosis not present

## 2019-04-06 DIAGNOSIS — W19XXXD Unspecified fall, subsequent encounter: Secondary | ICD-10-CM | POA: Diagnosis not present

## 2019-04-06 DIAGNOSIS — S4992XD Unspecified injury of left shoulder and upper arm, subsequent encounter: Secondary | ICD-10-CM | POA: Diagnosis not present

## 2019-04-06 DIAGNOSIS — H548 Legal blindness, as defined in USA: Secondary | ICD-10-CM | POA: Diagnosis not present

## 2019-04-06 DIAGNOSIS — Z8619 Personal history of other infectious and parasitic diseases: Secondary | ICD-10-CM | POA: Diagnosis not present

## 2019-04-06 DIAGNOSIS — Z85038 Personal history of other malignant neoplasm of large intestine: Secondary | ICD-10-CM | POA: Diagnosis not present

## 2019-04-06 DIAGNOSIS — I5032 Chronic diastolic (congestive) heart failure: Secondary | ICD-10-CM | POA: Diagnosis not present

## 2019-04-06 DIAGNOSIS — J449 Chronic obstructive pulmonary disease, unspecified: Secondary | ICD-10-CM | POA: Diagnosis not present

## 2019-04-06 DIAGNOSIS — Z8719 Personal history of other diseases of the digestive system: Secondary | ICD-10-CM | POA: Diagnosis not present

## 2019-04-06 DIAGNOSIS — Z7902 Long term (current) use of antithrombotics/antiplatelets: Secondary | ICD-10-CM | POA: Diagnosis not present

## 2019-04-06 DIAGNOSIS — M19012 Primary osteoarthritis, left shoulder: Secondary | ICD-10-CM | POA: Diagnosis not present

## 2019-04-06 DIAGNOSIS — I110 Hypertensive heart disease with heart failure: Secondary | ICD-10-CM | POA: Diagnosis not present

## 2019-04-06 DIAGNOSIS — K580 Irritable bowel syndrome with diarrhea: Secondary | ICD-10-CM | POA: Diagnosis not present

## 2019-04-06 DIAGNOSIS — M25522 Pain in left elbow: Secondary | ICD-10-CM | POA: Diagnosis not present

## 2019-04-06 DIAGNOSIS — Z602 Problems related to living alone: Secondary | ICD-10-CM | POA: Diagnosis not present

## 2019-04-06 DIAGNOSIS — E538 Deficiency of other specified B group vitamins: Secondary | ICD-10-CM | POA: Diagnosis not present

## 2019-04-06 DIAGNOSIS — Z79899 Other long term (current) drug therapy: Secondary | ICD-10-CM | POA: Diagnosis not present

## 2019-04-06 DIAGNOSIS — Z9181 History of falling: Secondary | ICD-10-CM | POA: Diagnosis not present

## 2019-04-06 DIAGNOSIS — Z7982 Long term (current) use of aspirin: Secondary | ICD-10-CM | POA: Diagnosis not present

## 2019-04-09 DIAGNOSIS — R202 Paresthesia of skin: Secondary | ICD-10-CM | POA: Diagnosis not present

## 2019-04-09 DIAGNOSIS — R197 Diarrhea, unspecified: Secondary | ICD-10-CM | POA: Diagnosis not present

## 2019-04-09 DIAGNOSIS — R1084 Generalized abdominal pain: Secondary | ICD-10-CM | POA: Diagnosis not present

## 2019-04-09 DIAGNOSIS — H903 Sensorineural hearing loss, bilateral: Secondary | ICD-10-CM | POA: Diagnosis not present

## 2019-04-09 DIAGNOSIS — R55 Syncope and collapse: Secondary | ICD-10-CM | POA: Diagnosis not present

## 2019-04-09 DIAGNOSIS — R42 Dizziness and giddiness: Secondary | ICD-10-CM | POA: Diagnosis not present

## 2019-04-11 DIAGNOSIS — K580 Irritable bowel syndrome with diarrhea: Secondary | ICD-10-CM | POA: Diagnosis not present

## 2019-04-11 DIAGNOSIS — Z602 Problems related to living alone: Secondary | ICD-10-CM | POA: Diagnosis not present

## 2019-04-11 DIAGNOSIS — Z9181 History of falling: Secondary | ICD-10-CM | POA: Diagnosis not present

## 2019-04-11 DIAGNOSIS — K219 Gastro-esophageal reflux disease without esophagitis: Secondary | ICD-10-CM | POA: Diagnosis not present

## 2019-04-11 DIAGNOSIS — E785 Hyperlipidemia, unspecified: Secondary | ICD-10-CM | POA: Diagnosis not present

## 2019-04-11 DIAGNOSIS — Z7982 Long term (current) use of aspirin: Secondary | ICD-10-CM | POA: Diagnosis not present

## 2019-04-11 DIAGNOSIS — W19XXXD Unspecified fall, subsequent encounter: Secondary | ICD-10-CM | POA: Diagnosis not present

## 2019-04-11 DIAGNOSIS — S59902D Unspecified injury of left elbow, subsequent encounter: Secondary | ICD-10-CM | POA: Diagnosis not present

## 2019-04-11 DIAGNOSIS — Z7902 Long term (current) use of antithrombotics/antiplatelets: Secondary | ICD-10-CM | POA: Diagnosis not present

## 2019-04-11 DIAGNOSIS — Z87891 Personal history of nicotine dependence: Secondary | ICD-10-CM | POA: Diagnosis not present

## 2019-04-11 DIAGNOSIS — E538 Deficiency of other specified B group vitamins: Secondary | ICD-10-CM | POA: Diagnosis not present

## 2019-04-11 DIAGNOSIS — J449 Chronic obstructive pulmonary disease, unspecified: Secondary | ICD-10-CM | POA: Diagnosis not present

## 2019-04-11 DIAGNOSIS — M19012 Primary osteoarthritis, left shoulder: Secondary | ICD-10-CM | POA: Diagnosis not present

## 2019-04-11 DIAGNOSIS — S4992XD Unspecified injury of left shoulder and upper arm, subsequent encounter: Secondary | ICD-10-CM | POA: Diagnosis not present

## 2019-04-11 DIAGNOSIS — Z85038 Personal history of other malignant neoplasm of large intestine: Secondary | ICD-10-CM | POA: Diagnosis not present

## 2019-04-11 DIAGNOSIS — Z8719 Personal history of other diseases of the digestive system: Secondary | ICD-10-CM | POA: Diagnosis not present

## 2019-04-11 DIAGNOSIS — Z8619 Personal history of other infectious and parasitic diseases: Secondary | ICD-10-CM | POA: Diagnosis not present

## 2019-04-11 DIAGNOSIS — Z79899 Other long term (current) drug therapy: Secondary | ICD-10-CM | POA: Diagnosis not present

## 2019-04-11 DIAGNOSIS — I5032 Chronic diastolic (congestive) heart failure: Secondary | ICD-10-CM | POA: Diagnosis not present

## 2019-04-11 DIAGNOSIS — M25522 Pain in left elbow: Secondary | ICD-10-CM | POA: Diagnosis not present

## 2019-04-11 DIAGNOSIS — I110 Hypertensive heart disease with heart failure: Secondary | ICD-10-CM | POA: Diagnosis not present

## 2019-04-11 DIAGNOSIS — H548 Legal blindness, as defined in USA: Secondary | ICD-10-CM | POA: Diagnosis not present

## 2019-04-13 DIAGNOSIS — M19012 Primary osteoarthritis, left shoulder: Secondary | ICD-10-CM | POA: Diagnosis not present

## 2019-04-13 DIAGNOSIS — Z602 Problems related to living alone: Secondary | ICD-10-CM | POA: Diagnosis not present

## 2019-04-13 DIAGNOSIS — H548 Legal blindness, as defined in USA: Secondary | ICD-10-CM | POA: Diagnosis not present

## 2019-04-13 DIAGNOSIS — K580 Irritable bowel syndrome with diarrhea: Secondary | ICD-10-CM | POA: Diagnosis not present

## 2019-04-13 DIAGNOSIS — E785 Hyperlipidemia, unspecified: Secondary | ICD-10-CM | POA: Diagnosis not present

## 2019-04-13 DIAGNOSIS — Z9181 History of falling: Secondary | ICD-10-CM | POA: Diagnosis not present

## 2019-04-13 DIAGNOSIS — S59902D Unspecified injury of left elbow, subsequent encounter: Secondary | ICD-10-CM | POA: Diagnosis not present

## 2019-04-13 DIAGNOSIS — J449 Chronic obstructive pulmonary disease, unspecified: Secondary | ICD-10-CM | POA: Diagnosis not present

## 2019-04-13 DIAGNOSIS — Z8619 Personal history of other infectious and parasitic diseases: Secondary | ICD-10-CM | POA: Diagnosis not present

## 2019-04-13 DIAGNOSIS — S4992XD Unspecified injury of left shoulder and upper arm, subsequent encounter: Secondary | ICD-10-CM | POA: Diagnosis not present

## 2019-04-13 DIAGNOSIS — Z8719 Personal history of other diseases of the digestive system: Secondary | ICD-10-CM | POA: Diagnosis not present

## 2019-04-13 DIAGNOSIS — E538 Deficiency of other specified B group vitamins: Secondary | ICD-10-CM | POA: Diagnosis not present

## 2019-04-13 DIAGNOSIS — Z79899 Other long term (current) drug therapy: Secondary | ICD-10-CM | POA: Diagnosis not present

## 2019-04-13 DIAGNOSIS — I5032 Chronic diastolic (congestive) heart failure: Secondary | ICD-10-CM | POA: Diagnosis not present

## 2019-04-13 DIAGNOSIS — Z7982 Long term (current) use of aspirin: Secondary | ICD-10-CM | POA: Diagnosis not present

## 2019-04-13 DIAGNOSIS — K219 Gastro-esophageal reflux disease without esophagitis: Secondary | ICD-10-CM | POA: Diagnosis not present

## 2019-04-13 DIAGNOSIS — Z7902 Long term (current) use of antithrombotics/antiplatelets: Secondary | ICD-10-CM | POA: Diagnosis not present

## 2019-04-13 DIAGNOSIS — Z85038 Personal history of other malignant neoplasm of large intestine: Secondary | ICD-10-CM | POA: Diagnosis not present

## 2019-04-13 DIAGNOSIS — I110 Hypertensive heart disease with heart failure: Secondary | ICD-10-CM | POA: Diagnosis not present

## 2019-04-13 DIAGNOSIS — W19XXXD Unspecified fall, subsequent encounter: Secondary | ICD-10-CM | POA: Diagnosis not present

## 2019-04-13 DIAGNOSIS — M25522 Pain in left elbow: Secondary | ICD-10-CM | POA: Diagnosis not present

## 2019-04-13 DIAGNOSIS — Z87891 Personal history of nicotine dependence: Secondary | ICD-10-CM | POA: Diagnosis not present

## 2019-04-16 ENCOUNTER — Ambulatory Visit (INDEPENDENT_AMBULATORY_CARE_PROVIDER_SITE_OTHER): Payer: Medicare Other | Admitting: Cardiology

## 2019-04-16 ENCOUNTER — Encounter: Payer: Self-pay | Admitting: Cardiology

## 2019-04-16 ENCOUNTER — Other Ambulatory Visit: Payer: Self-pay

## 2019-04-16 VITALS — BP 124/58 | HR 72 | Ht 62.0 in | Wt 134.2 lb

## 2019-04-16 DIAGNOSIS — I10 Essential (primary) hypertension: Secondary | ICD-10-CM | POA: Diagnosis not present

## 2019-04-16 DIAGNOSIS — E782 Mixed hyperlipidemia: Secondary | ICD-10-CM | POA: Diagnosis not present

## 2019-04-16 DIAGNOSIS — E119 Type 2 diabetes mellitus without complications: Secondary | ICD-10-CM

## 2019-04-16 DIAGNOSIS — R079 Chest pain, unspecified: Secondary | ICD-10-CM | POA: Diagnosis not present

## 2019-04-16 MED ORDER — ISOSORBIDE MONONITRATE ER 30 MG PO TB24: 30.0000 mg | ORAL_TABLET | Freq: Every day | ORAL | 2 refills | Status: DC

## 2019-04-16 NOTE — Progress Notes (Signed)
Cardiology Office Note:    Date:  04/16/2019   ID:  Heather Mccoy, DOB 1945/08/27, MRN [id number]  PCP:  Ernestene Kiel, MD  Cardiologist:  Berniece Salines, DO  Electrophysiologist:  None   Referring MD: Ernestene Kiel, MD   Follow-up visit.  History of Present Illness:    Heather Mccoy a 73 y.o.femalewith a hx of diabetes mellitus, colon cancer status post surgery, chronic diarrhea, anxiety, COPD not on oxygen, tobacco use, hypertension and hyperlipidemiapresents for follow-up visit.  I initially saw the patient onSeptember 4, 2020 at which time she was posthospitalization. She was hospitalized at [facility] in August where she was treated for syncope due to orthostatic hypotension. Her medications were optimized which included stopping her diuretics and she was started on midodrine and Florinef.On completion of our visit given her elevated blood pressure I did advise patient to only take her midodrine 5 mg 3 times daily if her systolic blood pressure was less than 120. Since her visit she has not needed to take the midodrine. She was also given a ZIO patch which she is still wearing to complete a 14-day monitoring. She was continued on the fludrocortisone 0.1 mg daily  The patient was seenon 02/05/2019.During that visit she was hypertensive and was still onfludrocortisone 0.1 mg daily. Started to titrate off the fludrocortisone. In addition the patient at the time did not want to start any antihypertensive medication. She was still wearing her Zio patch during the time of her visit.  At her last visit February 28, 2019 patient did have evidence of paroxysmal atrial tachycardia she was started on carvedilol 6.25 mg twice daily.  He was seen on April 02, 2019 at which time she reported intermittent chest pain she described as left-sided burning sensation which radiated to her left arm and also caused left arm numbness.  During her visit we discussed  getting a CTA coronaries.  However at the conclusion of her visit the patient was being wanted checked out she did have an episode where she felt significantly lightheaded and was guided to the floor without falling or passing out.  EMS was called and patient was transferred to the [facility] emergency department.  She was treated for vasovagal syncope and discharged home.  She is here today for follow-up visit.  She has been having intermittent chest pain but tells me that this has been responding to the use of Imdur.   Past Medical History:  Diagnosis Date  . Anxiety   . Arthritis   . Cancer ([place])    colon  . COPD (chronic obstructive pulmonary disease) ([place])   . Diabetes mellitus   . Dysrhythmia   . Hyperlipidemia   . Hypertension   . Mood disorder ([place])   . Orthostatic hypotension   . Shortness of breath     Past Surgical History:  Procedure Laterality Date  . APPENDECTOMY    . CHOLECYSTECTOMY    . COLON SURGERY    . EUS  01/12/2012   Procedure: UPPER ENDOSCOPIC ULTRASOUND (EUS) RADIAL;  Surgeon: Arta Silence, MD;  Location: [facility] ENDOSCOPY;  Service: Endoscopy;  Laterality: N/A;  to be admitted after  . REPLACEMENT TOTAL KNEE      Current Medications: Current Meds  Medication Sig  . Albuterol Sulfate 2.5 MG/0.5ML NEBU Inhale 2.5 mg into the lungs 3 (three) times daily as needed.  Marland Kitchen alendronate (FOSAMAX) 70 MG tablet Take 70 mg by mouth once a week. Take with a full glass of water  on an empty stomach.  Marland Kitchen aspirin EC 81 MG tablet Take 81 mg by mouth daily.  Marland Kitchen atorvastatin (LIPITOR) 40 MG tablet Take 40 mg by mouth daily.  . carvedilol (COREG) 6.25 MG tablet TAKE 1 TABLET(6.25 MG) BY MOUTH TWICE DAILY  . citalopram (CELEXA) 10 MG tablet Take 10 mg by mouth daily.  . clopidogrel (PLAVIX) 75 MG tablet Take 75 mg by mouth daily.  . diclofenac sodium (VOLTAREN) 1 % GEL Apply 2 g topically 4 (four) times daily.  Marland Kitchen dicyclomine (BENTYL) 10 MG capsule Take 1 capsule by mouth 3  (three) times daily before meals.   . famotidine (PEPCID) 40 MG tablet Take 40 mg by mouth 2 (two) times daily.  . hydrALAZINE (APRESOLINE) 50 MG tablet Take 50 mg by mouth 2 (two) times daily with a meal.  . hydrOXYzine (ATARAX/VISTARIL) 25 MG tablet Take 25 mg by mouth 3 (three) times daily as needed.  . isosorbide mononitrate (IMDUR) 30 MG 24 hr tablet Take 1 tablet (30 mg total) by mouth daily.  Marland Kitchen loperamide (IMODIUM) 2 MG capsule Take 2 mg by mouth every 6 (six) hours as needed for diarrhea or loose stools.  Marland Kitchen LORazepam (ATIVAN) 0.5 MG tablet Take 1 mg by mouth daily as needed.   . meclizine (ANTIVERT) 25 MG tablet Take 25 mg by mouth every 6 (six) hours as needed for dizziness.  . midodrine (PROAMATINE) 5 MG tablet Take 5 mg by mouth 3 (three) times daily as needed.  . montelukast (SINGULAIR) 10 MG tablet Take 10 mg by mouth at bedtime.  . Naproxen Sodium (ALEVE) 220 MG CAPS Take 1 capsule by mouth every 6 (six) hours as needed.  . nitroGLYCERIN (NITROSTAT) 0.4 MG SL tablet Place 1 tablet (0.4 mg total) under the tongue every 5 (five) minutes as needed for chest pain.  . pantoprazole (PROTONIX) 40 MG tablet Take 40 mg by mouth daily.  . potassium chloride SA (KLOR-CON) 20 MEQ tablet Take 20 mEq by mouth daily.  . promethazine (PHENERGAN) 25 MG tablet Take 1 tablet by mouth 3 (three) times daily as needed.  . traMADol (ULTRAM) 50 MG tablet Take 50 mg by mouth every 6 (six) hours as needed.  . [DISCONTINUED] isosorbide mononitrate (IMDUR) 30 MG 24 hr tablet Take 1 tablet (30 mg total) by mouth daily.     Allergies:   Codeine, Morphine, Tylenol [acetaminophen], and Oxycodone   Social History   Socioeconomic History  . Marital status: Widowed    Spouse name: Not on file  . Number of children: Not on file  . Years of education: Not on file  . Highest education level: Not on file  Occupational History  . Not on file  Social Needs  . Financial resource strain: Not on file  . Food  insecurity    Worry: Not on file    Inability: Not on file  . Transportation needs    Medical: Not on file    Non-medical: Not on file  Tobacco Use  . Smoking status: Current Every Day Smoker    Packs/day: 0.50    Types: Cigarettes  . Smokeless tobacco: Never Used  Substance and Sexual Activity  . Alcohol use: No  . Drug use: No  . Sexual activity: Never  Lifestyle  . Physical activity    Days per week: Not on file    Minutes per session: Not on file  . Stress: Not on file  Relationships  . Social connections    Talks  on phone: Not on file    Gets together: Not on file    Attends religious service: Not on file    Active member of club or organization: Not on file    Attends meetings of clubs or organizations: Not on file    Relationship status: Not on file  Other Topics Concern  . Not on file  Social History Narrative  . Not on file     Family History: The patient's family history includes CVA in her father; Cerebral aneurysm in her mother; Heart attack in her father and sister.  ROS:   Review of Systems  Constitution: Negative for decreased appetite, fever and weight gain.  HENT: Negative for congestion, ear discharge, hoarse voice and sore throat.   Eyes: Negative for discharge, redness, vision loss in right eye and visual halos.  Cardiovascular:Report chest pain. Negative for dyspnea on exertion, leg swelling, orthopnea and palpitations.  Respiratory: Negative for cough, hemoptysis, shortness of breath and snoring.   Endocrine: Negative for heat intolerance and polyphagia.  Hematologic/Lymphatic: Negative for bleeding problem. Does not bruise/bleed easily.  Skin: Negative for flushing, nail changes, rash and suspicious lesions.  Musculoskeletal: Negative for arthritis, joint pain, muscle cramps, myalgias, neck pain and stiffness.  Gastrointestinal: Negative for abdominal pain, bowel incontinence, diarrhea and excessive appetite.  Genitourinary: Negative for  decreased libido, genital sores and incomplete emptying.  Neurological: Negative for brief paralysis, focal weakness, headaches and loss of balance.  Psychiatric/Behavioral: Negative for altered mental status, depression and suicidal ideas.  Allergic/Immunologic: Negative for HIV exposure and persistent infections.    EKGs/Labs/Other Studies Reviewed:    The following studies were reviewed today:   EKG: None today  Recent Labs: 01/26/2019: BUN 17; Creatinine, Ser 0.71; Magnesium 1.8; Potassium 4.8; Sodium 137  Recent Lipid Panel No results found for: CHOL, TRIG, HDL, CHOLHDL, VLDL, LDLCALC, LDLDIRECT  Physical Exam:    VS:  BP (!) 124/58   Pulse 72   Ht 5\' 2"  (1.575 m)   Wt 134 lb 3.2 oz (60.9 kg)   BMI 24.55 kg/m     Wt Readings from Last 3 Encounters:  04/16/19 134 lb 3.2 oz (60.9 kg)  04/02/19 132 lb (59.9 kg)  02/28/19 130 lb 9.6 oz (59.2 kg)     GEN: Well nourished, well developed in no acute distress HEENT: Normal NECK: No JVD; No carotid bruits LYMPHATICS: No lymphadenopathy CARDIAC: S1S2 noted,RRR, no murmurs, rubs, gallops RESPIRATORY:  Clear to auscultation without rales, wheezing or rhonchi  ABDOMEN: Soft, non-tender, non-distended, +bowel sounds, no guarding. EXTREMITIES: No edema, No cyanosis, no clubbing MUSCULOSKELETAL:  No edema; No deformity  SKIN: Warm and dry NEUROLOGIC:  Alert and oriented x 3, non-focal PSYCHIATRIC:  Normal affect, good insight  ASSESSMENT:    1. Chest pain of uncertain etiology   2. Essential hypertension   3. Type 2 diabetes mellitus without complication, without long-term current use of insulin ([place])   4. Mixed hyperlipidemia    PLAN:    1.  Heather Mccoy is still having some chest pain but does respond to the Imdur 30 mg daily.  We will continue this medication.  CTA coronaries still pending scheduling.  2.  Her blood pressure is set up in the office today, therefore we will continue patient on her current antihypertensive  regimen.  The patient is in agreement with the above plan. The patient left the office in stable condition.  The patient will follow up in 3 months or sooner if needed.  Medication Adjustments/Labs and Tests Ordered: Current medicines are reviewed at length with the patient today.  Concerns regarding medicines are outlined above.  No orders of the defined types were placed in this encounter.  Meds ordered this encounter  Medications  . isosorbide mononitrate (IMDUR) 30 MG 24 hr tablet    Sig: Take 1 tablet (30 mg total) by mouth daily.    Dispense:  90 tablet    Refill:  2    Patient Instructions  Medication Instructions:  Your physician recommends that you continue on your current medications as directed. Please refer to the Current Medication list given to you today.  *If you need a refill on your cardiac medications before your next appointment, please call your pharmacy*  Lab Work: None If you have labs (blood work) drawn today and your tests are completely normal, you will receive your results only by: Marland Kitchen MyChart Message (if you have MyChart) OR . A paper copy in the mail If you have any lab test that is abnormal or we need to change your treatment, we will call you to review the results.  Testing/Procedures: None  Follow-Up: At [facility], you and your health needs are our priority.  As part of our continuing mission to provide you with exceptional heart care, we have created designated Provider Care Teams.  These Care Teams include your primary Cardiologist (physician) and Advanced Practice Providers (APPs -  Physician Assistants and Nurse Practitioners) who all work together to provide you with the care you need, when you need it.  Your next appointment:   3 month(s)  The format for your next appointment:   In Person  Provider:   Berniece Salines, DO  Other Instructions      Adopting a Healthy Lifestyle.  Know what a healthy weight is for you (roughly BMI  <25) and aim to maintain this   Aim for 7+ servings of fruits and vegetables daily   65-80+ fluid ounces of water or unsweet tea for healthy kidneys   Limit to max 1 drink of alcohol per day; avoid smoking/tobacco   Limit animal fats in diet for cholesterol and heart health - choose grass fed whenever available   Avoid highly processed foods, and foods high in saturated/trans fats   Aim for low stress - take time to unwind and care for your mental health   Aim for 150 min of moderate intensity exercise weekly for heart health, and weights twice weekly for bone health   Aim for 7-9 hours of sleep daily   When it comes to diets, agreement about the perfect plan isnt easy to find, even among the experts. Experts at the [employer] developed an idea known as the Healthy Eating Plate. Just imagine a plate divided into logical, healthy portions.   The emphasis is on diet quality:   Load up on vegetables and fruits - one-half of your plate: Aim for color and variety, and remember that potatoes dont count.   Go for whole grains - one-quarter of your plate: Whole wheat, barley, wheat berries, quinoa, oats, brown rice, and foods made with them. If you want pasta, go with whole wheat pasta.   Protein power - one-quarter of your plate: Fish, chicken, beans, and nuts are all healthy, versatile protein sources. Limit red meat.   The diet, however, does go beyond the plate, offering a few other suggestions.   Use healthy plant oils, such as olive, canola, soy, corn, sunflower and peanut.  Check the labels, and avoid partially hydrogenated oil, which have unhealthy trans fats.   If youre thirsty, drink water. Coffee and tea are good in moderation, but skip sugary drinks and limit milk and dairy products to one or two daily servings.   The type of carbohydrate in the diet is more important than the amount. Some sources of carbohydrates, such as vegetables, fruits, whole  grains, and beans-are healthier than others.   Finally, stay active  Signed, Berniece Salines, DO  04/16/2019 12:23 PM    [facility]

## 2019-04-16 NOTE — Patient Instructions (Signed)
Medication Instructions:  Your physician recommends that you continue on your current medications as directed. Please refer to the Current Medication list given to you today.  *If you need a refill on your cardiac medications before your next appointment, please call your pharmacy*  Lab Work: None If you have labs (blood work) drawn today and your tests are completely normal, you will receive your results only by: Marland Kitchen MyChart Message (if you have MyChart) OR . A paper copy in the mail If you have any lab test that is abnormal or we need to change your treatment, we will call you to review the results.  Testing/Procedures: None  Follow-Up: At [facility], you and your health needs are our priority.  As part of our continuing mission to provide you with exceptional heart care, we have created designated Provider Care Teams.  These Care Teams include your primary Cardiologist (physician) and Advanced Practice Providers (APPs -  Physician Assistants and Nurse Practitioners) who all work together to provide you with the care you need, when you need it.  Your next appointment:   3 month(s)  The format for your next appointment:   In Person  Provider:   Berniece Salines, DO  Other Instructions

## 2019-04-17 DIAGNOSIS — Z7982 Long term (current) use of aspirin: Secondary | ICD-10-CM | POA: Diagnosis not present

## 2019-04-17 DIAGNOSIS — Z85038 Personal history of other malignant neoplasm of large intestine: Secondary | ICD-10-CM | POA: Diagnosis not present

## 2019-04-17 DIAGNOSIS — E538 Deficiency of other specified B group vitamins: Secondary | ICD-10-CM | POA: Diagnosis not present

## 2019-04-17 DIAGNOSIS — I5032 Chronic diastolic (congestive) heart failure: Secondary | ICD-10-CM | POA: Diagnosis not present

## 2019-04-17 DIAGNOSIS — S4992XD Unspecified injury of left shoulder and upper arm, subsequent encounter: Secondary | ICD-10-CM | POA: Diagnosis not present

## 2019-04-17 DIAGNOSIS — K219 Gastro-esophageal reflux disease without esophagitis: Secondary | ICD-10-CM | POA: Diagnosis not present

## 2019-04-17 DIAGNOSIS — I110 Hypertensive heart disease with heart failure: Secondary | ICD-10-CM | POA: Diagnosis not present

## 2019-04-17 DIAGNOSIS — Z87891 Personal history of nicotine dependence: Secondary | ICD-10-CM | POA: Diagnosis not present

## 2019-04-17 DIAGNOSIS — S59902D Unspecified injury of left elbow, subsequent encounter: Secondary | ICD-10-CM | POA: Diagnosis not present

## 2019-04-17 DIAGNOSIS — M25522 Pain in left elbow: Secondary | ICD-10-CM | POA: Diagnosis not present

## 2019-04-17 DIAGNOSIS — W19XXXD Unspecified fall, subsequent encounter: Secondary | ICD-10-CM | POA: Diagnosis not present

## 2019-04-17 DIAGNOSIS — M19012 Primary osteoarthritis, left shoulder: Secondary | ICD-10-CM | POA: Diagnosis not present

## 2019-04-17 DIAGNOSIS — K580 Irritable bowel syndrome with diarrhea: Secondary | ICD-10-CM | POA: Diagnosis not present

## 2019-04-17 DIAGNOSIS — Z7902 Long term (current) use of antithrombotics/antiplatelets: Secondary | ICD-10-CM | POA: Diagnosis not present

## 2019-04-17 DIAGNOSIS — J449 Chronic obstructive pulmonary disease, unspecified: Secondary | ICD-10-CM | POA: Diagnosis not present

## 2019-04-17 DIAGNOSIS — Z602 Problems related to living alone: Secondary | ICD-10-CM | POA: Diagnosis not present

## 2019-04-17 DIAGNOSIS — Z8619 Personal history of other infectious and parasitic diseases: Secondary | ICD-10-CM | POA: Diagnosis not present

## 2019-04-17 DIAGNOSIS — Z9181 History of falling: Secondary | ICD-10-CM | POA: Diagnosis not present

## 2019-04-17 DIAGNOSIS — H548 Legal blindness, as defined in USA: Secondary | ICD-10-CM | POA: Diagnosis not present

## 2019-04-17 DIAGNOSIS — E785 Hyperlipidemia, unspecified: Secondary | ICD-10-CM | POA: Diagnosis not present

## 2019-04-17 DIAGNOSIS — Z79899 Other long term (current) drug therapy: Secondary | ICD-10-CM | POA: Diagnosis not present

## 2019-04-17 DIAGNOSIS — Z8719 Personal history of other diseases of the digestive system: Secondary | ICD-10-CM | POA: Diagnosis not present

## 2019-04-24 DIAGNOSIS — Z602 Problems related to living alone: Secondary | ICD-10-CM | POA: Diagnosis not present

## 2019-04-24 DIAGNOSIS — Z7902 Long term (current) use of antithrombotics/antiplatelets: Secondary | ICD-10-CM | POA: Diagnosis not present

## 2019-04-24 DIAGNOSIS — Z7982 Long term (current) use of aspirin: Secondary | ICD-10-CM | POA: Diagnosis not present

## 2019-04-24 DIAGNOSIS — Z85038 Personal history of other malignant neoplasm of large intestine: Secondary | ICD-10-CM | POA: Diagnosis not present

## 2019-04-24 DIAGNOSIS — K580 Irritable bowel syndrome with diarrhea: Secondary | ICD-10-CM | POA: Diagnosis not present

## 2019-04-24 DIAGNOSIS — W19XXXD Unspecified fall, subsequent encounter: Secondary | ICD-10-CM | POA: Diagnosis not present

## 2019-04-24 DIAGNOSIS — Z79899 Other long term (current) drug therapy: Secondary | ICD-10-CM | POA: Diagnosis not present

## 2019-04-24 DIAGNOSIS — J449 Chronic obstructive pulmonary disease, unspecified: Secondary | ICD-10-CM | POA: Diagnosis not present

## 2019-04-24 DIAGNOSIS — E785 Hyperlipidemia, unspecified: Secondary | ICD-10-CM | POA: Diagnosis not present

## 2019-04-24 DIAGNOSIS — Z8719 Personal history of other diseases of the digestive system: Secondary | ICD-10-CM | POA: Diagnosis not present

## 2019-04-24 DIAGNOSIS — S59902D Unspecified injury of left elbow, subsequent encounter: Secondary | ICD-10-CM | POA: Diagnosis not present

## 2019-04-24 DIAGNOSIS — K219 Gastro-esophageal reflux disease without esophagitis: Secondary | ICD-10-CM | POA: Diagnosis not present

## 2019-04-24 DIAGNOSIS — M19012 Primary osteoarthritis, left shoulder: Secondary | ICD-10-CM | POA: Diagnosis not present

## 2019-04-24 DIAGNOSIS — Z9181 History of falling: Secondary | ICD-10-CM | POA: Diagnosis not present

## 2019-04-24 DIAGNOSIS — H548 Legal blindness, as defined in USA: Secondary | ICD-10-CM | POA: Diagnosis not present

## 2019-04-24 DIAGNOSIS — Z87891 Personal history of nicotine dependence: Secondary | ICD-10-CM | POA: Diagnosis not present

## 2019-04-24 DIAGNOSIS — M25522 Pain in left elbow: Secondary | ICD-10-CM | POA: Diagnosis not present

## 2019-04-24 DIAGNOSIS — S4992XD Unspecified injury of left shoulder and upper arm, subsequent encounter: Secondary | ICD-10-CM | POA: Diagnosis not present

## 2019-04-24 DIAGNOSIS — I110 Hypertensive heart disease with heart failure: Secondary | ICD-10-CM | POA: Diagnosis not present

## 2019-04-24 DIAGNOSIS — E538 Deficiency of other specified B group vitamins: Secondary | ICD-10-CM | POA: Diagnosis not present

## 2019-04-24 DIAGNOSIS — Z8619 Personal history of other infectious and parasitic diseases: Secondary | ICD-10-CM | POA: Diagnosis not present

## 2019-04-24 DIAGNOSIS — I5032 Chronic diastolic (congestive) heart failure: Secondary | ICD-10-CM | POA: Diagnosis not present

## 2019-05-01 DIAGNOSIS — K580 Irritable bowel syndrome with diarrhea: Secondary | ICD-10-CM | POA: Diagnosis not present

## 2019-05-01 DIAGNOSIS — J449 Chronic obstructive pulmonary disease, unspecified: Secondary | ICD-10-CM | POA: Diagnosis not present

## 2019-05-01 DIAGNOSIS — Z7902 Long term (current) use of antithrombotics/antiplatelets: Secondary | ICD-10-CM | POA: Diagnosis not present

## 2019-05-01 DIAGNOSIS — Z602 Problems related to living alone: Secondary | ICD-10-CM | POA: Diagnosis not present

## 2019-05-01 DIAGNOSIS — Z85038 Personal history of other malignant neoplasm of large intestine: Secondary | ICD-10-CM | POA: Diagnosis not present

## 2019-05-01 DIAGNOSIS — Z7982 Long term (current) use of aspirin: Secondary | ICD-10-CM | POA: Diagnosis not present

## 2019-05-01 DIAGNOSIS — Z8619 Personal history of other infectious and parasitic diseases: Secondary | ICD-10-CM | POA: Diagnosis not present

## 2019-05-01 DIAGNOSIS — E785 Hyperlipidemia, unspecified: Secondary | ICD-10-CM | POA: Diagnosis not present

## 2019-05-01 DIAGNOSIS — M19012 Primary osteoarthritis, left shoulder: Secondary | ICD-10-CM | POA: Diagnosis not present

## 2019-05-01 DIAGNOSIS — M25522 Pain in left elbow: Secondary | ICD-10-CM | POA: Diagnosis not present

## 2019-05-01 DIAGNOSIS — S59902D Unspecified injury of left elbow, subsequent encounter: Secondary | ICD-10-CM | POA: Diagnosis not present

## 2019-05-01 DIAGNOSIS — E538 Deficiency of other specified B group vitamins: Secondary | ICD-10-CM | POA: Diagnosis not present

## 2019-05-01 DIAGNOSIS — K219 Gastro-esophageal reflux disease without esophagitis: Secondary | ICD-10-CM | POA: Diagnosis not present

## 2019-05-01 DIAGNOSIS — Z79899 Other long term (current) drug therapy: Secondary | ICD-10-CM | POA: Diagnosis not present

## 2019-05-01 DIAGNOSIS — I5032 Chronic diastolic (congestive) heart failure: Secondary | ICD-10-CM | POA: Diagnosis not present

## 2019-05-01 DIAGNOSIS — Z9181 History of falling: Secondary | ICD-10-CM | POA: Diagnosis not present

## 2019-05-01 DIAGNOSIS — I110 Hypertensive heart disease with heart failure: Secondary | ICD-10-CM | POA: Diagnosis not present

## 2019-05-01 DIAGNOSIS — Z8719 Personal history of other diseases of the digestive system: Secondary | ICD-10-CM | POA: Diagnosis not present

## 2019-05-01 DIAGNOSIS — S4992XD Unspecified injury of left shoulder and upper arm, subsequent encounter: Secondary | ICD-10-CM | POA: Diagnosis not present

## 2019-05-01 DIAGNOSIS — W19XXXD Unspecified fall, subsequent encounter: Secondary | ICD-10-CM | POA: Diagnosis not present

## 2019-05-01 DIAGNOSIS — H548 Legal blindness, as defined in USA: Secondary | ICD-10-CM | POA: Diagnosis not present

## 2019-05-01 DIAGNOSIS — Z87891 Personal history of nicotine dependence: Secondary | ICD-10-CM | POA: Diagnosis not present

## 2019-05-08 DIAGNOSIS — R600 Localized edema: Secondary | ICD-10-CM | POA: Diagnosis not present

## 2019-05-08 DIAGNOSIS — Z6824 Body mass index (BMI) 24.0-24.9, adult: Secondary | ICD-10-CM | POA: Diagnosis not present

## 2019-05-08 DIAGNOSIS — R1031 Right lower quadrant pain: Secondary | ICD-10-CM | POA: Diagnosis not present

## 2019-05-09 DIAGNOSIS — R1031 Right lower quadrant pain: Secondary | ICD-10-CM | POA: Diagnosis not present

## 2019-05-10 DIAGNOSIS — Z79899 Other long term (current) drug therapy: Secondary | ICD-10-CM | POA: Diagnosis not present

## 2019-05-10 DIAGNOSIS — Z602 Problems related to living alone: Secondary | ICD-10-CM | POA: Diagnosis not present

## 2019-05-10 DIAGNOSIS — K580 Irritable bowel syndrome with diarrhea: Secondary | ICD-10-CM | POA: Diagnosis not present

## 2019-05-10 DIAGNOSIS — Z87891 Personal history of nicotine dependence: Secondary | ICD-10-CM | POA: Diagnosis not present

## 2019-05-10 DIAGNOSIS — M25522 Pain in left elbow: Secondary | ICD-10-CM | POA: Diagnosis not present

## 2019-05-10 DIAGNOSIS — E785 Hyperlipidemia, unspecified: Secondary | ICD-10-CM | POA: Diagnosis not present

## 2019-05-10 DIAGNOSIS — Z8619 Personal history of other infectious and parasitic diseases: Secondary | ICD-10-CM | POA: Diagnosis not present

## 2019-05-10 DIAGNOSIS — Z9181 History of falling: Secondary | ICD-10-CM | POA: Diagnosis not present

## 2019-05-10 DIAGNOSIS — S4992XD Unspecified injury of left shoulder and upper arm, subsequent encounter: Secondary | ICD-10-CM | POA: Diagnosis not present

## 2019-05-10 DIAGNOSIS — K219 Gastro-esophageal reflux disease without esophagitis: Secondary | ICD-10-CM | POA: Diagnosis not present

## 2019-05-10 DIAGNOSIS — S59902D Unspecified injury of left elbow, subsequent encounter: Secondary | ICD-10-CM | POA: Diagnosis not present

## 2019-05-10 DIAGNOSIS — E538 Deficiency of other specified B group vitamins: Secondary | ICD-10-CM | POA: Diagnosis not present

## 2019-05-10 DIAGNOSIS — I5032 Chronic diastolic (congestive) heart failure: Secondary | ICD-10-CM | POA: Diagnosis not present

## 2019-05-10 DIAGNOSIS — W19XXXD Unspecified fall, subsequent encounter: Secondary | ICD-10-CM | POA: Diagnosis not present

## 2019-05-10 DIAGNOSIS — Z8719 Personal history of other diseases of the digestive system: Secondary | ICD-10-CM | POA: Diagnosis not present

## 2019-05-10 DIAGNOSIS — H548 Legal blindness, as defined in USA: Secondary | ICD-10-CM | POA: Diagnosis not present

## 2019-05-10 DIAGNOSIS — I110 Hypertensive heart disease with heart failure: Secondary | ICD-10-CM | POA: Diagnosis not present

## 2019-05-10 DIAGNOSIS — Z7902 Long term (current) use of antithrombotics/antiplatelets: Secondary | ICD-10-CM | POA: Diagnosis not present

## 2019-05-10 DIAGNOSIS — Z85038 Personal history of other malignant neoplasm of large intestine: Secondary | ICD-10-CM | POA: Diagnosis not present

## 2019-05-10 DIAGNOSIS — Z7982 Long term (current) use of aspirin: Secondary | ICD-10-CM | POA: Diagnosis not present

## 2019-05-10 DIAGNOSIS — J449 Chronic obstructive pulmonary disease, unspecified: Secondary | ICD-10-CM | POA: Diagnosis not present

## 2019-05-10 DIAGNOSIS — M19012 Primary osteoarthritis, left shoulder: Secondary | ICD-10-CM | POA: Diagnosis not present

## 2019-05-17 DIAGNOSIS — Z7982 Long term (current) use of aspirin: Secondary | ICD-10-CM | POA: Diagnosis not present

## 2019-05-17 DIAGNOSIS — M19012 Primary osteoarthritis, left shoulder: Secondary | ICD-10-CM | POA: Diagnosis not present

## 2019-05-17 DIAGNOSIS — Z79899 Other long term (current) drug therapy: Secondary | ICD-10-CM | POA: Diagnosis not present

## 2019-05-17 DIAGNOSIS — Z8619 Personal history of other infectious and parasitic diseases: Secondary | ICD-10-CM | POA: Diagnosis not present

## 2019-05-17 DIAGNOSIS — Z9181 History of falling: Secondary | ICD-10-CM | POA: Diagnosis not present

## 2019-05-17 DIAGNOSIS — I110 Hypertensive heart disease with heart failure: Secondary | ICD-10-CM | POA: Diagnosis not present

## 2019-05-17 DIAGNOSIS — S4992XD Unspecified injury of left shoulder and upper arm, subsequent encounter: Secondary | ICD-10-CM | POA: Diagnosis not present

## 2019-05-17 DIAGNOSIS — Z8719 Personal history of other diseases of the digestive system: Secondary | ICD-10-CM | POA: Diagnosis not present

## 2019-05-17 DIAGNOSIS — J449 Chronic obstructive pulmonary disease, unspecified: Secondary | ICD-10-CM | POA: Diagnosis not present

## 2019-05-17 DIAGNOSIS — Z7902 Long term (current) use of antithrombotics/antiplatelets: Secondary | ICD-10-CM | POA: Diagnosis not present

## 2019-05-17 DIAGNOSIS — Z87891 Personal history of nicotine dependence: Secondary | ICD-10-CM | POA: Diagnosis not present

## 2019-05-17 DIAGNOSIS — H548 Legal blindness, as defined in USA: Secondary | ICD-10-CM | POA: Diagnosis not present

## 2019-05-17 DIAGNOSIS — E785 Hyperlipidemia, unspecified: Secondary | ICD-10-CM | POA: Diagnosis not present

## 2019-05-17 DIAGNOSIS — S59902D Unspecified injury of left elbow, subsequent encounter: Secondary | ICD-10-CM | POA: Diagnosis not present

## 2019-05-17 DIAGNOSIS — M25522 Pain in left elbow: Secondary | ICD-10-CM | POA: Diagnosis not present

## 2019-05-17 DIAGNOSIS — I5032 Chronic diastolic (congestive) heart failure: Secondary | ICD-10-CM | POA: Diagnosis not present

## 2019-05-17 DIAGNOSIS — K219 Gastro-esophageal reflux disease without esophagitis: Secondary | ICD-10-CM | POA: Diagnosis not present

## 2019-05-17 DIAGNOSIS — W19XXXD Unspecified fall, subsequent encounter: Secondary | ICD-10-CM | POA: Diagnosis not present

## 2019-05-17 DIAGNOSIS — Z602 Problems related to living alone: Secondary | ICD-10-CM | POA: Diagnosis not present

## 2019-05-17 DIAGNOSIS — E538 Deficiency of other specified B group vitamins: Secondary | ICD-10-CM | POA: Diagnosis not present

## 2019-05-17 DIAGNOSIS — K580 Irritable bowel syndrome with diarrhea: Secondary | ICD-10-CM | POA: Diagnosis not present

## 2019-05-17 DIAGNOSIS — Z85038 Personal history of other malignant neoplasm of large intestine: Secondary | ICD-10-CM | POA: Diagnosis not present

## 2019-05-21 DIAGNOSIS — E278 Other specified disorders of adrenal gland: Secondary | ICD-10-CM | POA: Diagnosis not present

## 2019-05-21 DIAGNOSIS — R310 Gross hematuria: Secondary | ICD-10-CM | POA: Diagnosis not present

## 2019-05-21 DIAGNOSIS — R197 Diarrhea, unspecified: Secondary | ICD-10-CM | POA: Diagnosis not present

## 2019-05-21 DIAGNOSIS — R1084 Generalized abdominal pain: Secondary | ICD-10-CM | POA: Diagnosis not present

## 2019-05-21 DIAGNOSIS — Z6824 Body mass index (BMI) 24.0-24.9, adult: Secondary | ICD-10-CM | POA: Diagnosis not present

## 2019-05-22 DIAGNOSIS — D3502 Benign neoplasm of left adrenal gland: Secondary | ICD-10-CM | POA: Diagnosis not present

## 2019-05-22 DIAGNOSIS — E278 Other specified disorders of adrenal gland: Secondary | ICD-10-CM | POA: Diagnosis not present

## 2019-05-25 ENCOUNTER — Ambulatory Visit: Payer: Self-pay | Admitting: Neurology

## 2019-06-05 ENCOUNTER — Telehealth: Payer: Self-pay | Admitting: Cardiology

## 2019-06-05 DIAGNOSIS — Z01812 Encounter for preprocedural laboratory examination: Secondary | ICD-10-CM | POA: Diagnosis not present

## 2019-06-05 LAB — BASIC METABOLIC PANEL
BUN/Creatinine Ratio: 21 (ref 12–28)
BUN: 11 mg/dL (ref 8–27)
CO2: 20 mmol/L (ref 20–29)
Calcium: 8.9 mg/dL (ref 8.7–10.3)
Chloride: 110 mmol/L — ABNORMAL HIGH (ref 96–106)
Creatinine, Ser: 0.53 mg/dL — ABNORMAL LOW (ref 0.57–1.00)
GFR calc Af Amer: 110 mL/min/{1.73_m2} (ref >59)
GFR calc non Af Amer: 95 mL/min/{1.73_m2} (ref >59)
Glucose: 92 mg/dL (ref 65–99)
Potassium: 4.3 mmol/L (ref 3.5–5.2)
Sodium: 141 mmol/L (ref 134–144)

## 2019-06-05 NOTE — Telephone Encounter (Signed)
Pt calling just to let us know that she got her labs drawn today at [place] will let Dr. Terrial Rhodes nurse know so she can watch out for them.

## 2019-06-05 NOTE — Telephone Encounter (Signed)
New Message    Pt is calling to speak with the Lab She says she is calling because she had to go to the lab at [place]  For blood work and they did get the blood    Please call back

## 2019-06-06 ENCOUNTER — Telehealth (HOSPITAL_COMMUNITY): Payer: Self-pay | Admitting: Emergency Medicine

## 2019-06-06 NOTE — Telephone Encounter (Signed)
Left message on voicemail with name and callback number Louiza Moor RN Navigator Cardiac Imaging [facility] [phone] Office [phone] Cell  

## 2019-06-07 ENCOUNTER — Ambulatory Visit (HOSPITAL_COMMUNITY)
Admission: RE | Admit: 2019-06-07 | Discharge: 2019-06-07 | Disposition: A | Discharge status: Home / Self Care | Payer: Medicare Other | Source: Ambulatory Visit | Attending: Cardiology | Admitting: Cardiology

## 2019-06-07 ENCOUNTER — Other Ambulatory Visit: Payer: Self-pay

## 2019-06-07 ENCOUNTER — Telehealth: Payer: Self-pay

## 2019-06-07 DIAGNOSIS — I2510 Atherosclerotic heart disease of native coronary artery without angina pectoris: Secondary | ICD-10-CM | POA: Diagnosis not present

## 2019-06-07 DIAGNOSIS — I700 Atherosclerosis of aorta: Secondary | ICD-10-CM | POA: Insufficient documentation

## 2019-06-07 DIAGNOSIS — R072 Precordial pain: Secondary | ICD-10-CM | POA: Diagnosis not present

## 2019-06-07 DIAGNOSIS — R918 Other nonspecific abnormal finding of lung field: Secondary | ICD-10-CM | POA: Insufficient documentation

## 2019-06-07 MED ORDER — ONDANSETRON HCL 4 MG/2ML IJ SOLN
INTRAMUSCULAR | Status: AC
  Filled 2019-06-07: qty 2

## 2019-06-07 MED ORDER — NITROGLYCERIN 0.4 MG SL SUBL
SUBLINGUAL_TABLET | SUBLINGUAL | Status: AC
  Administered 2019-06-07: 0.4 mg via SUBLINGUAL
  Filled 2019-06-07: qty 1

## 2019-06-07 MED ORDER — IOHEXOL 350 MG/ML SOLN
80.0000 mL | Freq: Once | INTRAVENOUS | Status: AC | PRN
  Administered 2019-06-07: 80 mL via INTRAVENOUS

## 2019-06-07 MED ORDER — NITROGLYCERIN 0.4 MG SL SUBL: 0.4000 mg | SUBLINGUAL_TABLET | Freq: Once | SUBLINGUAL | Status: AC

## 2019-06-07 MED ORDER — ONDANSETRON HCL 4 MG/2ML IJ SOLN
4.0000 mg | INTRAMUSCULAR | Status: AC | PRN
  Administered 2019-06-07: 4 mg via INTRAVENOUS

## 2019-06-07 NOTE — Telephone Encounter (Signed)
-----   Message from Berniece Salines, DO sent at 06/06/2019  5:36 PM EST ----- Normal labs. Please notify patient.

## 2019-06-08 DIAGNOSIS — R072 Precordial pain: Secondary | ICD-10-CM | POA: Diagnosis not present

## 2019-06-11 ENCOUNTER — Encounter: Payer: Self-pay | Admitting: Cardiology

## 2019-06-11 ENCOUNTER — Telehealth: Payer: Self-pay | Admitting: Cardiology

## 2019-06-11 ENCOUNTER — Other Ambulatory Visit: Payer: Self-pay

## 2019-06-11 ENCOUNTER — Ambulatory Visit (INDEPENDENT_AMBULATORY_CARE_PROVIDER_SITE_OTHER): Payer: Medicare Other | Admitting: Cardiology

## 2019-06-11 VITALS — BP 172/80 | HR 67 | Ht 62.0 in | Wt 131.2 lb

## 2019-06-11 DIAGNOSIS — R030 Elevated blood-pressure reading, without diagnosis of hypertension: Secondary | ICD-10-CM | POA: Diagnosis not present

## 2019-06-11 DIAGNOSIS — Z6824 Body mass index (BMI) 24.0-24.9, adult: Secondary | ICD-10-CM | POA: Diagnosis not present

## 2019-06-11 DIAGNOSIS — R079 Chest pain, unspecified: Secondary | ICD-10-CM | POA: Diagnosis not present

## 2019-06-11 DIAGNOSIS — I10 Essential (primary) hypertension: Secondary | ICD-10-CM

## 2019-06-11 DIAGNOSIS — I471 Supraventricular tachycardia: Secondary | ICD-10-CM | POA: Insufficient documentation

## 2019-06-11 DIAGNOSIS — E782 Mixed hyperlipidemia: Secondary | ICD-10-CM

## 2019-06-11 DIAGNOSIS — I25118 Atherosclerotic heart disease of native coronary artery with other forms of angina pectoris: Secondary | ICD-10-CM | POA: Insufficient documentation

## 2019-06-11 DIAGNOSIS — R300 Dysuria: Secondary | ICD-10-CM | POA: Diagnosis not present

## 2019-06-11 DIAGNOSIS — I5032 Chronic diastolic (congestive) heart failure: Secondary | ICD-10-CM | POA: Diagnosis not present

## 2019-06-11 DIAGNOSIS — I4719 Other supraventricular tachycardia: Secondary | ICD-10-CM

## 2019-06-11 DIAGNOSIS — R002 Palpitations: Secondary | ICD-10-CM | POA: Diagnosis not present

## 2019-06-11 HISTORY — DX: Other supraventricular tachycardia: I47.19

## 2019-06-11 HISTORY — DX: Mixed hyperlipidemia: E78.2

## 2019-06-11 HISTORY — DX: Essential (primary) hypertension: I10

## 2019-06-11 HISTORY — DX: Atherosclerotic heart disease of native coronary artery with other forms of angina pectoris: I25.118

## 2019-06-11 HISTORY — DX: Supraventricular tachycardia: I47.1

## 2019-06-11 MED ORDER — CARVEDILOL 12.5 MG PO TABS: 12.5000 mg | ORAL_TABLET | Freq: Two times a day (BID) | ORAL | 3 refills | Status: DC

## 2019-06-11 NOTE — Telephone Encounter (Signed)
New Message  Pt called and would like to know about her CT results.  Please call back to discuss

## 2019-06-11 NOTE — Patient Instructions (Signed)
Medication Instructions:  Your physician has recommended you make the following change in your medication:   INCREASE: Coreg (carvedilol) 12.5 mg Take 1 tab twice daily ( May take 2 tabs of 6.25 mg Take 2 tbs twice daily until you run out)  *If you need a refill on your cardiac medications before your next appointment, please call your pharmacy*  Lab Work: NOne If you have labs (blood work) drawn today and your tests are completely normal, you will receive your results only by: Marland Kitchen MyChart Message (if you have MyChart) OR . A paper copy in the mail If you have any lab test that is abnormal or we need to change your treatment, we will call you to review the results.  Testing/Procedures: None  Follow-Up: At [facility], you and your health needs are our priority.  As part of our continuing mission to provide you with exceptional heart care, we have created designated Provider Care Teams.  These Care Teams include your primary Cardiologist (physician) and Advanced Practice Providers (APPs -  Physician Assistants and Nurse Practitioners) who all work together to provide you with the care you need, when you need it.  Your next appointment:   1 month(s)  The format for your next appointment:   In Person  Provider:   Berniece Salines, DO  Other Instructions

## 2019-06-11 NOTE — Progress Notes (Signed)
Cardiology Office Note:    Date:  06/11/2019   ID:  Heather Mccoy, DOB 11-30-1945, MRN [id number]  PCP:  Ernestene Kiel, MD  Cardiologist:  Berniece Salines, DO  Electrophysiologist:  None   Referring MD: Ernestene Kiel, MD     History of Present Illness:    Heather Mccoy a 74 y.o.femalewith a hx of diabetes mellitus, colon cancer status post surgery, chronic diarrhea, anxiety, COPD not on oxygen, tobacco use, hypertension and hyperlipidemiapresents for follow-up visit.  I initially saw the patient onSeptember 4, 2020 at which time she was posthospitalization. She was hospitalized at [facility] in August where she was treated for syncope due to orthostatic hypotension. Her medications were optimized which included stopping her diuretics and she was started on midodrine and Florinef.On completion of our visit given her elevated blood pressure I did advise patient to only take her midodrine 5 mg 3 times daily if her systolic blood pressure was less than 120. Since her visit she has not needed to take the midodrine. She was also given a ZIO patch which she is still wearing to complete a 14-day monitoring. She was continued on the fludrocortisone 0.1 mg daily  The patient was seenon 02/05/2019.During that visit she was hypertensive and was still onfludrocortisone 0.1 mg daily. Started to titrate off the fludrocortisone. In addition the patient at the time did not want to start any antihypertensive medication. She was still wearing her Zio patch during the time of her visit.At her last visit October 14,2020 patient did have evidence of paroxysmal atrial tachycardiashe was started on carvedilol 6.25 mg twice daily.  He was seen on April 02, 2019 at which time she reported intermittent chest pain she described as left-sided burning sensation which radiated to her left arm and also caused left arm numbness.  During her visit we discussed getting a CTA  coronaries.  However at the conclusion of her visit the patient was being wanted checked out she did have an episode where she felt significantly lightheaded and was guided to the floor without falling or passing out.  The patient was taken to [facility] at that time thought to be vasovagal syncope.    On April 16, 2019 I saw the patient in follow-up visit and she is still having some chest pain but tells me it did respond to her Imdur, and her CCTA was scheduled.  She was able to get her coronary CTA done she is here today to discuss results.  In addition tells me that her blood pressure has been elevated recently and asked by her PCP to see audiology.   Past Medical History:  Diagnosis Date  . Anxiety   . Arthritis   . Cancer ([place])    colon  . COPD (chronic obstructive pulmonary disease) ([place])   . Diabetes mellitus   . Dysrhythmia   . Hyperlipidemia   . Hypertension   . Mood disorder ([place])   . Orthostatic hypotension   . Shortness of breath     Past Surgical History:  Procedure Laterality Date  . APPENDECTOMY    . CHOLECYSTECTOMY    . COLON SURGERY    . EUS  01/12/2012   Procedure: UPPER ENDOSCOPIC ULTRASOUND (EUS) RADIAL;  Surgeon: Arta Silence, MD;  Location: [facility];  Service: Endoscopy;  Laterality: N/A;  to be admitted after  . REPLACEMENT TOTAL KNEE      Current Medications: Current Meds  Medication Sig  . Albuterol Sulfate 2.5 MG/0.5ML NEBU Inhale 2.5  mg into the lungs 3 (three) times daily as needed.  Marland Kitchen alendronate (FOSAMAX) 70 MG tablet Take 70 mg by mouth once a week. Take with a full glass of water on an empty stomach.  Marland Kitchen aspirin EC 81 MG tablet Take 81 mg by mouth daily.  Marland Kitchen atorvastatin (LIPITOR) 40 MG tablet Take 40 mg by mouth daily.  . citalopram (CELEXA) 10 MG tablet Take 10 mg by mouth daily.  . clopidogrel (PLAVIX) 75 MG tablet Take 75 mg by mouth daily.  . diclofenac sodium (VOLTAREN) 1 % GEL Apply 2 g topically 4 (four) times daily.  Marland Kitchen  dicyclomine (BENTYL) 10 MG capsule Take 1 capsule by mouth 3 (three) times daily before meals.   . famotidine (PEPCID) 40 MG tablet Take 40 mg by mouth 2 (two) times daily.  . hydrALAZINE (APRESOLINE) 50 MG tablet Take 50 mg by mouth 2 (two) times daily with a meal.  . hydrOXYzine (ATARAX/VISTARIL) 25 MG tablet Take 25 mg by mouth 3 (three) times daily as needed.  . isosorbide mononitrate (IMDUR) 30 MG 24 hr tablet Take 1 tablet (30 mg total) by mouth daily.  Marland Kitchen loperamide (IMODIUM) 2 MG capsule Take 2 mg by mouth every 6 (six) hours as needed for diarrhea or loose stools.  Marland Kitchen LORazepam (ATIVAN) 0.5 MG tablet Take 1 mg by mouth daily as needed.   . meclizine (ANTIVERT) 25 MG tablet Take 25 mg by mouth every 6 (six) hours as needed for dizziness.  . midodrine (PROAMATINE) 5 MG tablet Take 5 mg by mouth 3 (three) times daily as needed.  . montelukast (SINGULAIR) 10 MG tablet Take 10 mg by mouth at bedtime.  . Naproxen Sodium (ALEVE) 220 MG CAPS Take 1 capsule by mouth every 6 (six) hours as needed.  . pantoprazole (PROTONIX) 40 MG tablet Take 40 mg by mouth daily.  . potassium chloride SA (KLOR-CON) 20 MEQ tablet Take 20 mEq by mouth daily.  . promethazine (PHENERGAN) 25 MG tablet Take 1 tablet by mouth 3 (three) times daily as needed.  . traMADol (ULTRAM) 50 MG tablet Take 50 mg by mouth every 6 (six) hours as needed.  . [DISCONTINUED] carvedilol (COREG) 6.25 MG tablet TAKE 1 TABLET(6.25 MG) BY MOUTH TWICE DAILY     Allergies:   Codeine, Morphine, Nitroglycerin, Tylenol [acetaminophen], and Oxycodone   Social History   Socioeconomic History  . Marital status: Widowed    Spouse name: Not on file  . Number of children: Not on file  . Years of education: Not on file  . Highest education level: Not on file  Occupational History  . Not on file  Tobacco Use  . Smoking status: Current Every Day Smoker    Packs/day: 0.50    Types: Cigarettes  . Smokeless tobacco: Never Used  Substance and  Sexual Activity  . Alcohol use: No  . Drug use: No  . Sexual activity: Never  Other Topics Concern  . Not on file  Social History Narrative  . Not on file   Social Determinants of Health   Financial Resource Strain:   . Difficulty of Paying Living Expenses: Not on file  Food Insecurity:   . Worried About Charity fundraiser in the Last Year: Not on file  . Ran Out of Food in the Last Year: Not on file  Transportation Needs:   . Lack of Transportation (Medical): Not on file  . Lack of Transportation (Non-Medical): Not on file  Physical Activity:   .  Days of Exercise per Week: Not on file  . Minutes of Exercise per Session: Not on file  Stress:   . Feeling of Stress : Not on file  Social Connections:   . Frequency of Communication with Friends and Family: Not on file  . Frequency of Social Gatherings with Friends and Family: Not on file  . Attends Religious Services: Not on file  . Active Member of Clubs or Organizations: Not on file  . Attends Archivist Meetings: Not on file  . Marital Status: Not on file     Family History: The patient's family history includes CVA in her father; Cerebral aneurysm in her mother; Heart attack in her father and sister.  ROS:   Review of Systems  Constitution: Negative for decreased appetite, fever and weight gain.  HENT: Negative for congestion, ear discharge, hoarse voice and sore throat.   Eyes: Negative for discharge, redness, vision loss in right eye and visual halos.  Cardiovascular: Negative for chest pain, dyspnea on exertion, leg swelling, orthopnea and palpitations.  Respiratory: Negative for cough, hemoptysis, shortness of breath and snoring.   Endocrine: Negative for heat intolerance and polyphagia.  Hematologic/Lymphatic: Negative for bleeding problem. Does not bruise/bleed easily.  Skin: Negative for flushing, nail changes, rash and suspicious lesions.  Musculoskeletal: Negative for arthritis, joint pain, muscle  cramps, myalgias, neck pain and stiffness.  Gastrointestinal: Negative for abdominal pain, bowel incontinence, diarrhea and excessive appetite.  Genitourinary: Negative for decreased libido, genital sores and incomplete emptying.  Neurological: Negative for brief paralysis, focal weakness, headaches and loss of balance.  Psychiatric/Behavioral: Negative for altered mental status, depression and suicidal ideas.  Allergic/Immunologic: Negative for HIV exposure and persistent infections.    EKGs/Labs/Other Studies Reviewed:    The following studies were reviewed today:   EKG:  The ekg ordered today demonstrates ectopic atrial rhythm, heart rate 67 bpm.  Coronary CT FFR IMPRESSION: 1.  CT FFR analysis didn't show any significant stenosis.  Coronary CTA  June 07, 2019 Aorta: Normal size. There is mild calcification of the aortic root. No dissection. Aortic Valve:  Trileaflet.  No calcifications.  Coronary Arteries:  Normal coronary origin.  Right dominance.  RCA is a large dominant artery that gives rise to PDA and PLVB. There is moderate (50-69%) calcified plaque in the mid portion of the vessel. The proximal and distal portion with no plaques.  Left main is a large artery that gives rise to LAD, Ramus intermedius and LCX arteries.  LAD is a large vessel that has no plaque. There is mild (25-49%) calcified plaque in the proximal portion of the vessel. There are diffuse minimal calcified plaques in the mid to distal portions of the LAD. D1 is a small caliber vessel with no plaque.  Ramus intermedius with no plaque.  LCX is a non-dominant artery that gives rise to one OM branch. There is no plaque.  Other findings:  Normal pulmonary vein drainage into the left atrium.  Normal left atrial appendage without a thrombus.  Normal size of the pulmonary artery.  IMPRESSION: 1. Coronary calcium score of 345. This was 36 percentile for age and sex matched  control.  2. Normal coronary origin with right dominance.  3. Moderate Non-obstructive coronary artery disease. CAD-RADS 3. This study will sent for FFR.   Zio Monitor  The Patient wore the monitor for 13 days 23 hours. Indication: Syncope The minimum HR was 49 bp with a maximum HR of 176 bpm, and average HR of  70 bpm.  Predominant underlying rhythm was Sinus Rhythm. A total of 64 Supraventricular Tachycardia runs occurred, the run with the fastest interval lasting 8 beats with a max rate of 176 bpm, the longest lasting 14.1 secs with an avg rate of 127 bpm. The episodes of Supraventricular Tachycardia (likely Atrial Tachycardia with variable block). Occasional Premature atrial complex (PAC) at [phone] total- 2.4%. [PACs Couplets (<1.0%, 3122), and PACs Triplets were rare (<1.0%, 81)]. Premature ventricular complex were rare (<1.0%),  PVC Couplets were rare (<1.0%), with no PVC Triplets Present. No Pauses, No AV block and no Atrial fibrillation present.  Recent Labs: 01/26/2019: Magnesium 1.8 06/05/2019: BUN 11; Creatinine, Ser 0.53; Potassium 4.3; Sodium 141  Recent Lipid Panel No results found for: CHOL, TRIG, HDL, CHOLHDL, VLDL, LDLCALC, LDLDIRECT  Physical Exam:    VS:  BP (!) 172/80 (BP Location: Right Arm, Patient Position: Sitting, Cuff Size: Normal)   Pulse 67   Ht 5\' 2"  (1.575 m)   Wt 131 lb 3.2 oz (59.5 kg)   SpO2 100%   BMI 24.00 kg/m     Wt Readings from Last 3 Encounters:  06/11/19 131 lb 3.2 oz (59.5 kg)  04/16/19 134 lb 3.2 oz (60.9 kg)  04/02/19 132 lb (59.9 kg)     GEN: Obese, well nourished, well developed in no acute distress HEENT: Normal NECK: No JVD; No carotid bruits LYMPHATICS: No lymphadenopathy CARDIAC: S1S2 noted,RRR, no murmurs, rubs, gallops RESPIRATORY:  Clear to auscultation without rales, wheezing or rhonchi  ABDOMEN: Soft, non-tender, non-distended, +bowel sounds, no guarding. EXTREMITIES: No edema, No cyanosis, no  clubbing MUSCULOSKELETAL:  No edema; No deformity  SKIN: Warm and dry NEUROLOGIC:  Alert and oriented x 3, non-focal PSYCHIATRIC:  Normal affect, good insight  ASSESSMENT:    1. Paroxysmal atrial tachycardia ([place])   2. Coronary artery disease of native artery of native heart with stable angina pectoris ([place])   3. Essential hypertension   4. Mixed hyperlipidemia    PLAN:    1.  Her blood pressure is elevated in the office today.  She tells me that she is having more fluttering of her heartbeat.  Therefore I am going to increase her Coreg to 12.5 mg twice daily.  I have to keep a close eye on the patient because she does have significant hypotension at times which she was on Florinef and midodrine in the past.  2.  I discussed her CT results with her coronary artery disease, she is responding to Imdur and her current medical therapy which includes aspirin 81 mg daily as well as Plavix 75 mg daily, Coreg 12.5 mg daily, atorvastatin 40 mg daily and Imdur 30 mg daily.    The patient is in agreement with the above plan. The patient left the office in stable condition.  The patient will follow up in 1 month.   Medication Adjustments/Labs and Tests Ordered: Current medicines are reviewed at length with the patient today.  Concerns regarding medicines are outlined above.  No orders of the defined types were placed in this encounter.  Meds ordered this encounter  Medications  . carvedilol (COREG) 12.5 MG tablet    Sig: Take 1 tablet (12.5 mg total) by mouth 2 (two) times daily.    Dispense:  180 tablet    Refill:  3    Patient Instructions  Medication Instructions:  Your physician has recommended you make the following change in your medication:   INCREASE: Coreg (carvedilol) 12.5 mg Take 1 tab twice  daily ( May take 2 tabs of 6.25 mg Take 2 tbs twice daily until you run out)  *If you need a refill on your cardiac medications before your next appointment, please call your  pharmacy*  Lab Work: NOne If you have labs (blood work) drawn today and your tests are completely normal, you will receive your results only by: Marland Kitchen MyChart Message (if you have MyChart) OR . A paper copy in the mail If you have any lab test that is abnormal or we need to change your treatment, we will call you to review the results.  Testing/Procedures: None  Follow-Up: At [facility], you and your health needs are our priority.  As part of our continuing mission to provide you with exceptional heart care, we have created designated Provider Care Teams.  These Care Teams include your primary Cardiologist (physician) and Advanced Practice Providers (APPs -  Physician Assistants and Nurse Practitioners) who all work together to provide you with the care you need, when you need it.  Your next appointment:   1 month(s)  The format for your next appointment:   In Person  Provider:   Berniece Salines, DO  Other Instructions      Adopting a Healthy Lifestyle.  Know what a healthy weight is for you (roughly BMI <25) and aim to maintain this   Aim for 7+ servings of fruits and vegetables daily   65-80+ fluid ounces of water or unsweet tea for healthy kidneys   Limit to max 1 drink of alcohol per day; avoid smoking/tobacco   Limit animal fats in diet for cholesterol and heart health - choose grass fed whenever available   Avoid highly processed foods, and foods high in saturated/trans fats   Aim for low stress - take time to unwind and care for your mental health   Aim for 150 min of moderate intensity exercise weekly for heart health, and weights twice weekly for bone health   Aim for 7-9 hours of sleep daily   When it comes to diets, agreement about the perfect plan isnt easy to find, even among the experts. Experts at the [employer] developed an idea known as the Healthy Eating Plate. Just imagine a plate divided into logical, healthy portions.   The  emphasis is on diet quality:   Load up on vegetables and fruits - one-half of your plate: Aim for color and variety, and remember that potatoes dont count.   Go for whole grains - one-quarter of your plate: Whole wheat, barley, wheat berries, quinoa, oats, brown rice, and foods made with them. If you want pasta, go with whole wheat pasta.   Protein power - one-quarter of your plate: Fish, chicken, beans, and nuts are all healthy, versatile protein sources. Limit red meat.   The diet, however, does go beyond the plate, offering a few other suggestions.   Use healthy plant oils, such as olive, canola, soy, corn, sunflower and peanut. Check the labels, and avoid partially hydrogenated oil, which have unhealthy trans fats.   If youre thirsty, drink water. Coffee and tea are good in moderation, but skip sugary drinks and limit milk and dairy products to one or two daily servings.   The type of carbohydrate in the diet is more important than the amount. Some sources of carbohydrates, such as vegetables, fruits, whole grains, and beans-are healthier than others.   Finally, stay active  Signed, Berniece Salines, DO  06/11/2019 4:35 PM  [facility]

## 2019-06-19 NOTE — Telephone Encounter (Signed)
Patient was seen 06/11/19 by Dr. Harriet Masson and per her note she discussed CT results with patient. Closing encounter.

## 2019-07-06 ENCOUNTER — Ambulatory Visit: Admitting: Neurology

## 2019-07-09 ENCOUNTER — Ambulatory Visit: Admitting: Neurology

## 2019-07-10 ENCOUNTER — Telehealth: Payer: Self-pay | Admitting: Cardiology

## 2019-07-10 NOTE — Telephone Encounter (Signed)
Heather Mccoy is calling requesting [name] attend her upcoming appointment scheduled for 07/13/19 at 10:00 AM due to her not being able to hear the Doctor well and Heather Mccoy being her transportation. Please advise.

## 2019-07-10 NOTE — Telephone Encounter (Signed)
Left message that it would be OK for her friend to come back with her on her visit.

## 2019-07-13 ENCOUNTER — Other Ambulatory Visit: Payer: Self-pay

## 2019-07-13 ENCOUNTER — Ambulatory Visit (INDEPENDENT_AMBULATORY_CARE_PROVIDER_SITE_OTHER): Payer: Medicare Other | Admitting: Cardiology

## 2019-07-13 ENCOUNTER — Ambulatory Visit: Payer: Medicare Other | Admitting: Cardiology

## 2019-07-13 ENCOUNTER — Encounter: Payer: Self-pay | Admitting: Cardiology

## 2019-07-13 VITALS — BP 144/68 | HR 73 | Temp 97.7°F | Ht 62.0 in | Wt 130.0 lb

## 2019-07-13 DIAGNOSIS — I10 Essential (primary) hypertension: Secondary | ICD-10-CM | POA: Diagnosis not present

## 2019-07-13 DIAGNOSIS — I471 Supraventricular tachycardia: Secondary | ICD-10-CM

## 2019-07-13 DIAGNOSIS — E782 Mixed hyperlipidemia: Secondary | ICD-10-CM | POA: Diagnosis not present

## 2019-07-13 DIAGNOSIS — I25118 Atherosclerotic heart disease of native coronary artery with other forms of angina pectoris: Secondary | ICD-10-CM

## 2019-07-13 DIAGNOSIS — E119 Type 2 diabetes mellitus without complications: Secondary | ICD-10-CM | POA: Diagnosis not present

## 2019-07-13 MED ORDER — AMLODIPINE BESYLATE 2.5 MG PO TABS: 2.5000 mg | ORAL_TABLET | Freq: Every day | ORAL | 1 refills | Status: DC

## 2019-07-13 MED ORDER — RANOLAZINE ER 500 MG PO TB12: 500.0000 mg | ORAL_TABLET | Freq: Two times a day (BID) | ORAL | 1 refills | Status: DC

## 2019-07-13 NOTE — Patient Instructions (Signed)
Medication Instructions:  Your physician has recommended you make the following change in your medication:   STOP: Imdur(isosorbide mononitrate)   START: Ranexa(ranolazine) 500 mg Take 1 tab twice daily START: Amlodipine  2.5 mg Take 1 tab daily  *If you need a refill on your cardiac medications before your next appointment, please call your pharmacy*   Lab Work: None If you have labs (blood work) drawn today and your tests are completely normal, you will receive your results only by: Marland Kitchen MyChart Message (if you have MyChart) OR . A paper copy in the mail If you have any lab test that is abnormal or we need to change your treatment, we will call you to review the results.   Testing/Procedures: None   Follow-Up: At [facility], you and your health needs are our priority.  As part of our continuing mission to provide you with exceptional heart care, we have created designated Provider Care Teams.  These Care Teams include your primary Cardiologist (physician) and Advanced Practice Providers (APPs -  Physician Assistants and Nurse Practitioners) who all work together to provide you with the care you need, when you need it.  We recommend signing up for the patient portal called "MyChart".  Sign up information is provided on this After Visit Summary.  MyChart is used to connect with patients for Virtual Visits (Telemedicine).  Patients are able to view lab/test results, encounter notes, upcoming appointments, etc.  Non-urgent messages can be sent to your provider as well.   To learn more about what you can do with MyChart, go to [email or web address].    Your next appointment:   2 month(s)  The format for your next appointment:   In Person  Provider:   Berniece Salines, DO   Other Instructions

## 2019-07-13 NOTE — Progress Notes (Signed)
Cardiology Office Note:    Date:  07/13/2019   ID:  Heather Mccoy, DOB 1945-11-01, MRN [id number]  PCP:  Ernestene Kiel, MD  Cardiologist:  Berniece Salines, DO  Electrophysiologist:  None   Referring MD: Ernestene Kiel, MD   Follow up visit for CAD , experiencing chest pain  History of Present Illness:    Heather Mccoy a 74 y.o.femalewith a hx of diabetes mellitus, colon cancer status post surgery, chronic diarrhea, anxiety, COPD not on oxygen, tobacco use, hypertension and hyperlipidemiapresents for follow-up visit.  I initially saw the patient onSeptember 4, 2020 at which time she was posthospitalization. She was hospitalized at [facility] in August where she was treated for syncope due to orthostatic hypotension. Her medications were optimized which included stopping her diuretics and she was started on midodrine and Florinef.On completion of our visit given her elevated blood pressure I did advise patient to only take her midodrine 5 mg 3 times daily if her systolic blood pressure was less than 120. Since her visit she has not needed to take the midodrine. She was also given a ZIO patch which she is still wearing to complete a 14-day monitoring. She was continued on the fludrocortisone 0.1 mg daily  The patient was seenon 02/05/2019.During that visit she was hypertensive and was still onfludrocortisone 0.1 mg daily. Started to titrate off the fludrocortisone. In addition the patient at the time did not want to start any antihypertensive medication. She was still wearing her Zio patch during the time of her visit.At her last visit October 14,2020 patient did have evidence of paroxysmal atrial tachycardiashe was started on carvedilol 6.25 mg twice daily.  She was seen on April 02, 2019 at which time she reported intermittent chest pain she described as left-sided burning sensation which radiated to her left arm and also caused left arm  numbness. During her visit we discussed getting a CTA coronaries.However at the conclusion of her visit the patient was being wanted checked out she did have an episode where she felt significantly lightheaded and was guided to the floor without falling or passing out.  The patient was taken to [facility] at that time thought to be vasovagal syncope.    On April 16, 2019 I saw the patient in follow-up visit and she is still having some chest pain but tells me it did respond to her Imdur, and her CCTA was scheduled.  She was seen on June 11, 2019 at that time she tells me that she was having intermittent chest pain but the Imdur seem to have been helping.  At that visit her blood pressure was also elevated, therefore in the setting of her increased SVT as well as PACs I increase her Coreg to 12.5 mg twice a day.  Past Medical History:  Diagnosis Date  . Anxiety   . Arthritis   . Cancer ([place])    colon  . COPD (chronic obstructive pulmonary disease) ([place])   . Diabetes mellitus   . Dysrhythmia   . Hyperlipidemia   . Hypertension   . Mood disorder ([place])   . Orthostatic hypotension   . Shortness of breath     Past Surgical History:  Procedure Laterality Date  . APPENDECTOMY    . CHOLECYSTECTOMY    . COLON SURGERY    . EUS  01/12/2012   Procedure: UPPER ENDOSCOPIC ULTRASOUND (EUS) RADIAL;  Surgeon: Arta Silence, MD;  Location: [facility] ENDOSCOPY;  Service: Endoscopy;  Laterality: N/A;  to be admitted  after  . REPLACEMENT TOTAL KNEE      Current Medications: Current Meds  Medication Sig  . Albuterol Sulfate 2.5 MG/0.5ML NEBU Inhale 2.5 mg into the lungs 3 (three) times daily as needed.  Marland Kitchen alendronate (FOSAMAX) 70 MG tablet Take 70 mg by mouth once a week. Take with a full glass of water on an empty stomach.  Marland Kitchen aspirin EC 81 MG tablet Take 81 mg by mouth daily.  Marland Kitchen atorvastatin (LIPITOR) 40 MG tablet Take 40 mg by mouth daily.  . carvedilol (COREG) 12.5 MG tablet Take 1  tablet (12.5 mg total) by mouth 2 (two) times daily.  . citalopram (CELEXA) 10 MG tablet Take 10 mg by mouth daily.  . clopidogrel (PLAVIX) 75 MG tablet Take 75 mg by mouth daily.  . diclofenac sodium (VOLTAREN) 1 % GEL Apply 2 g topically 4 (four) times daily.  Marland Kitchen dicyclomine (BENTYL) 10 MG capsule Take 1 capsule by mouth 3 (three) times daily before meals.   . famotidine (PEPCID) 40 MG tablet Take 40 mg by mouth 2 (two) times daily.  . hydrALAZINE (APRESOLINE) 50 MG tablet Take 50 mg by mouth 2 (two) times daily with a meal.  . hydrOXYzine (ATARAX/VISTARIL) 25 MG tablet Take 25 mg by mouth 3 (three) times daily as needed.  . loperamide (IMODIUM) 2 MG capsule Take 2 mg by mouth every 6 (six) hours as needed for diarrhea or loose stools.  Marland Kitchen LORazepam (ATIVAN) 0.5 MG tablet Take 1 mg by mouth daily as needed.   . meclizine (ANTIVERT) 25 MG tablet Take 25 mg by mouth every 6 (six) hours as needed for dizziness.  . midodrine (PROAMATINE) 5 MG tablet Take 5 mg by mouth 3 (three) times daily as needed.  . montelukast (SINGULAIR) 10 MG tablet Take 10 mg by mouth at bedtime.  . Naproxen Sodium (ALEVE) 220 MG CAPS Take 1 capsule by mouth every 6 (six) hours as needed.  . pantoprazole (PROTONIX) 40 MG tablet Take 40 mg by mouth daily.  . potassium chloride SA (KLOR-CON) 20 MEQ tablet Take 20 mEq by mouth daily.  . promethazine (PHENERGAN) 25 MG tablet Take 1 tablet by mouth 3 (three) times daily as needed.  . traMADol (ULTRAM) 50 MG tablet Take 50 mg by mouth every 6 (six) hours as needed.  . [DISCONTINUED] isosorbide mononitrate (IMDUR) 30 MG 24 hr tablet Take 1 tablet (30 mg total) by mouth daily.     Allergies:   Codeine, Morphine, Nitroglycerin, Tylenol [acetaminophen], and Oxycodone   Social History   Socioeconomic History  . Marital status: Widowed    Spouse name: Not on file  . Number of children: Not on file  . Years of education: Not on file  . Highest education level: Not on file    Occupational History  . Not on file  Tobacco Use  . Smoking status: Current Every Day Smoker    Packs/day: 0.50    Types: Cigarettes  . Smokeless tobacco: Never Used  Substance and Sexual Activity  . Alcohol use: No  . Drug use: No  . Sexual activity: Never  Other Topics Concern  . Not on file  Social History Narrative  . Not on file   Social Determinants of Health   Financial Resource Strain:   . Difficulty of Paying Living Expenses: Not on file  Food Insecurity:   . Worried About Charity fundraiser in the Last Year: Not on file  . Ran Out of Food in the  Last Year: Not on file  Transportation Needs:   . Lack of Transportation (Medical): Not on file  . Lack of Transportation (Non-Medical): Not on file  Physical Activity:   . Days of Exercise per Week: Not on file  . Minutes of Exercise per Session: Not on file  Stress:   . Feeling of Stress : Not on file  Social Connections:   . Frequency of Communication with Friends and Family: Not on file  . Frequency of Social Gatherings with Friends and Family: Not on file  . Attends Religious Services: Not on file  . Active Member of Clubs or Organizations: Not on file  . Attends Archivist Meetings: Not on file  . Marital Status: Not on file     Family History: The patient's family history includes CVA in her father; Cerebral aneurysm in her mother; Heart attack in her father and sister.  ROS:   Review of Systems  Constitution: Negative for decreased appetite, fever and weight gain.  HENT: Negative for congestion, ear discharge, hoarse voice and sore throat.   Eyes: Negative for discharge, redness, vision loss in right eye and visual halos.  Cardiovascular: Negative for chest pain, dyspnea on exertion, leg swelling, orthopnea and palpitations.  Respiratory: Negative for cough, hemoptysis, shortness of breath and snoring.   Endocrine: Negative for heat intolerance and polyphagia.  Hematologic/Lymphatic: Negative  for bleeding problem. Does not bruise/bleed easily.  Skin: Negative for flushing, nail changes, rash and suspicious lesions.  Musculoskeletal: Negative for arthritis, joint pain, muscle cramps, myalgias, neck pain and stiffness.  Gastrointestinal: Negative for abdominal pain, bowel incontinence, diarrhea and excessive appetite.  Genitourinary: Negative for decreased libido, genital sores and incomplete emptying.  Neurological: Negative for brief paralysis, focal weakness, headaches and loss of balance.  Psychiatric/Behavioral: Negative for altered mental status, depression and suicidal ideas.  Allergic/Immunologic: Negative for HIV exposure and persistent infections.    EKGs/Labs/Other Studies Reviewed:    The following studies were reviewed today:   EKG: None today  Coronary CT FFR IMPRESSION: 1. CT FFR analysis didn't show any significant stenosis.  Coronary CTA  June 07, 2019 Aorta: Normal size. There is mild calcification of the aortic root. No dissection. Aortic Valve: Trileaflet. No calcifications.  Coronary Arteries: Normal coronary origin. Right dominance.  RCA is a large dominant artery that gives rise to PDA and PLVB. There is moderate (50-69%) calcified plaque in the mid portion of the vessel. The proximal and distal portion with no plaques.  Left main is a large artery that gives rise to LAD, Ramus intermedius and LCX arteries.  LAD is a large vessel that has no plaque. There is mild (25-49%) calcified plaque in the proximal portion of the vessel. There are diffuse minimal calcified plaques in the mid to distal portions of the LAD. D1 is a small caliber vessel with no plaque.  Ramus intermedius with no plaque.  LCX is a non-dominant artery that gives rise to one OM branch. There is no plaque.  Other findings:  Normal pulmonary vein drainage into the left atrium.  Normal left atrial appendage without a thrombus.  Normal size of the  pulmonary artery.  IMPRESSION: 1. Coronary calcium score of 345. This was 9 percentile for age and sex matched control.  2. Normal coronary origin with right dominance.  3. Moderate Non-obstructive coronary artery disease. CAD-RADS 3. This study will sent for FFR.   Zio Monitor  The Patient wore the monitor for 13 days 23 hours. Indication: Syncope  The minimum HR was 49 bp with a maximum HR of 176 bpm, and average HR of 70bpm.  Predominant underlying rhythm was Sinus Rhythm. A total of 64 Supraventricular Tachycardia runs occurred, the run with the fastest interval lasting 8 beats with a max rate of 176 bpm, the longest lasting 14.1 secs with an avg rate of 127 bpm. The episodes of Supraventricular Tachycardia (likely Atrial Tachycardia with variable block). Occasional Premature atrial complex (PAC) at [phone] total- 2.4%. [PACs Couplets (<1.0%, 3122), and PACs Triplets were rare (<1.0%, 81)]. Premature ventricular complex were rare (<1.0%), PVC Couplets were rare (<1.0%), with no PVC Triplets Present. No Pauses, No AV block and no Atrial fibrillation present.   Recent Labs: 01/26/2019: Magnesium 1.8 06/05/2019: BUN 11; Creatinine, Ser 0.53; Potassium 4.3; Sodium 141  Recent Lipid Panel No results found for: CHOL, TRIG, HDL, CHOLHDL, VLDL, LDLCALC, LDLDIRECT  Physical Exam:    VS:  BP (!) 144/68   Pulse 73   Temp 97.7 F (36.5 C)   Ht 5\' 2"  (1.575 m)   Wt 130 lb (59 kg)   SpO2 95%   BMI 23.78 kg/m     Wt Readings from Last 3 Encounters:  07/13/19 130 lb (59 kg)  06/11/19 131 lb 3.2 oz (59.5 kg)  04/16/19 134 lb 3.2 oz (60.9 kg)     GEN: Well nourished, well developed in no acute distress HEENT: Normal NECK: No JVD; No carotid bruits LYMPHATICS: No lymphadenopathy CARDIAC: S1S2 noted,RRR, no murmurs, rubs, gallops RESPIRATORY:  Clear to auscultation without rales, wheezing or rhonchi  ABDOMEN: Soft, non-tender, non-distended, +bowel sounds, no  guarding. EXTREMITIES: No edema, No cyanosis, no clubbing MUSCULOSKELETAL:  No deformity  SKIN: Warm and dry NEUROLOGIC:  Alert and oriented x 3, non-focal PSYCHIATRIC:  Normal affect, good insight  ASSESSMENT:    1. Coronary artery disease of native artery of native heart with stable angina pectoris ([place])   2. Essential hypertension   3. Paroxysmal atrial tachycardia ([place])   4. Type 2 diabetes mellitus without complication, without long-term current use of insulin ([place])   5. Mixed hyperlipidemia    PLAN:    Today she tells me she is having intermittent symptoms of chest pain and does not think the Imdur is helping anymore.  Therefore at this time I am going to stop the Imdur and start her on amlodipine 2.5 mg daily as well as Ranexa 500 mg twice a day.  I am hoping she can get some symptomatic relief from this.  Her blood pressure is has improved since her last visit but still a bit elevated with a 2.5 mg amlodipine which she has some decrease in her blood pressure.  I have to keep a close eye on this patient as in the past she has had significant hypotension requiring midodrine and Florinef.  Paroxysmal atrial tachycardia, continue patient her current Coreg dose.  Hyperlipidemia-continue with Lipitor 40 mg a day.  Diabetes type 2-continue patient on her regimen per PCP  The patient is in agreement with the above plan. The patient left the office in stable condition.  The patient will follow up in 2 months or sooner if needed.   Medication Adjustments/Labs and Tests Ordered: Current medicines are reviewed at length with the patient today.  Concerns regarding medicines are outlined above.  No orders of the defined types were placed in this encounter.  Meds ordered this encounter  Medications  . ranolazine (RANEXA) 500 MG 12 hr tablet    Sig: Take  1 tablet (500 mg total) by mouth 2 (two) times daily.    Dispense:  180 tablet    Refill:  1  . amLODipine (NORVASC) 2.5 MG tablet     Sig: Take 1 tablet (2.5 mg total) by mouth daily.    Dispense:  90 tablet    Refill:  1    Patient Instructions  Medication Instructions:  Your physician has recommended you make the following change in your medication:   STOP: Imdur(isosorbide mononitrate)   START: Ranexa(ranolazine) 500 mg Take 1 tab twice daily START: Amlodipine  2.5 mg Take 1 tab daily  *If you need a refill on your cardiac medications before your next appointment, please call your pharmacy*   Lab Work: None If you have labs (blood work) drawn today and your tests are completely normal, you will receive your results only by: Marland Kitchen MyChart Message (if you have MyChart) OR . A paper copy in the mail If you have any lab test that is abnormal or we need to change your treatment, we will call you to review the results.   Testing/Procedures: None   Follow-Up: At [facility], you and your health needs are our priority.  As part of our continuing mission to provide you with exceptional heart care, we have created designated Provider Care Teams.  These Care Teams include your primary Cardiologist (physician) and Advanced Practice Providers (APPs -  Physician Assistants and Nurse Practitioners) who all work together to provide you with the care you need, when you need it.  We recommend signing up for the patient portal called "MyChart".  Sign up information is provided on this After Visit Summary.  MyChart is used to connect with patients for Virtual Visits (Telemedicine).  Patients are able to view lab/test results, encounter notes, upcoming appointments, etc.  Non-urgent messages can be sent to your provider as well.   To learn more about what you can do with MyChart, go to [email or web address].    Your next appointment:   2 month(s)  The format for your next appointment:   In Person  Provider:   Berniece Salines, DO   Other Instructions      Adopting a Healthy Lifestyle.  Know what a healthy weight  is for you (roughly BMI <25) and aim to maintain this   Aim for 7+ servings of fruits and vegetables daily   65-80+ fluid ounces of water or unsweet tea for healthy kidneys   Limit to max 1 drink of alcohol per day; avoid smoking/tobacco   Limit animal fats in diet for cholesterol and heart health - choose grass fed whenever available   Avoid highly processed foods, and foods high in saturated/trans fats   Aim for low stress - take time to unwind and care for your mental health   Aim for 150 min of moderate intensity exercise weekly for heart health, and weights twice weekly for bone health   Aim for 7-9 hours of sleep daily   When it comes to diets, agreement about the perfect plan isnt easy to find, even among the experts. Experts at the [employer] developed an idea known as the Healthy Eating Plate. Just imagine a plate divided into logical, healthy portions.   The emphasis is on diet quality:   Load up on vegetables and fruits - one-half of your plate: Aim for color and variety, and remember that potatoes dont count.   Go for whole grains - one-quarter of your plate: Whole wheat,  barley, wheat berries, quinoa, oats, brown rice, and foods made with them. If you want pasta, go with whole wheat pasta.   Protein power - one-quarter of your plate: Fish, chicken, beans, and nuts are all healthy, versatile protein sources. Limit red meat.   The diet, however, does go beyond the plate, offering a few other suggestions.   Use healthy plant oils, such as olive, canola, soy, corn, sunflower and peanut. Check the labels, and avoid partially hydrogenated oil, which have unhealthy trans fats.   If youre thirsty, drink water. Coffee and tea are good in moderation, but skip sugary drinks and limit milk and dairy products to one or two daily servings.   The type of carbohydrate in the diet is more important than the amount. Some sources of carbohydrates, such as  vegetables, fruits, whole grains, and beans-are healthier than others.   Finally, stay active  Signed, Berniece Salines, DO  07/13/2019 1:37 PM    [facility]

## 2019-07-14 ENCOUNTER — Other Ambulatory Visit: Payer: Self-pay | Admitting: Family Medicine

## 2019-07-16 ENCOUNTER — Telehealth: Payer: Self-pay | Admitting: Cardiology

## 2019-07-16 NOTE — Telephone Encounter (Signed)
Called pt and she stated that every time she takes one of these medications; ranexa and/or norvasc - she gets chest pain. Asked pt what she is doing at the time of her chest pain - states that she is not doing anything. Asked pt if the pain radiated anywhere- denied. Asked pt if she takes anything to make it it bette, denied. Advised that she needs to  Seen. Will send to schedulers to make appt. Advised pt to call 911 go to nearest ER. Verbalized understating,.

## 2019-07-16 NOTE — Telephone Encounter (Signed)
Pt c/o medication issue:  1. Name of Medication:  ranolazine (RANEXA) 500 MG 12 hr tablet amLODipine (NORVASC) 2.5 MG tablet  2. How are you currently taking this medication (dosage and times per day)? As directed  3. Are you having a reaction (difficulty breathing--STAT)? yes  4. What is your medication issue? Pt gets sharp pounding sensations in her chest and a pain that moves down her arm. She also gets nauseated. She is not sure which medication is causing the issue

## 2019-07-19 NOTE — Telephone Encounter (Signed)
LMTCB to schedule appt with Dr. Harriet Masson.

## 2019-07-20 DIAGNOSIS — R29810 Facial weakness: Secondary | ICD-10-CM | POA: Diagnosis not present

## 2019-07-20 DIAGNOSIS — I639 Cerebral infarction, unspecified: Secondary | ICD-10-CM | POA: Diagnosis not present

## 2019-07-20 DIAGNOSIS — R001 Bradycardia, unspecified: Secondary | ICD-10-CM | POA: Diagnosis not present

## 2019-07-20 DIAGNOSIS — R0789 Other chest pain: Secondary | ICD-10-CM | POA: Diagnosis not present

## 2019-07-20 DIAGNOSIS — D649 Anemia, unspecified: Secondary | ICD-10-CM | POA: Diagnosis not present

## 2019-07-20 DIAGNOSIS — R4781 Slurred speech: Secondary | ICD-10-CM | POA: Diagnosis not present

## 2019-07-21 DIAGNOSIS — R4781 Slurred speech: Secondary | ICD-10-CM | POA: Diagnosis not present

## 2019-07-21 DIAGNOSIS — R0789 Other chest pain: Secondary | ICD-10-CM | POA: Diagnosis not present

## 2019-07-21 DIAGNOSIS — G43909 Migraine, unspecified, not intractable, without status migrainosus: Secondary | ICD-10-CM | POA: Diagnosis not present

## 2019-07-21 DIAGNOSIS — I6389 Other cerebral infarction: Secondary | ICD-10-CM

## 2019-07-21 DIAGNOSIS — Z7952 Long term (current) use of systemic steroids: Secondary | ICD-10-CM | POA: Diagnosis not present

## 2019-07-21 DIAGNOSIS — I313 Pericardial effusion (noninflammatory): Secondary | ICD-10-CM

## 2019-07-21 DIAGNOSIS — R29810 Facial weakness: Secondary | ICD-10-CM | POA: Diagnosis not present

## 2019-07-21 DIAGNOSIS — R29818 Other symptoms and signs involving the nervous system: Secondary | ICD-10-CM | POA: Diagnosis not present

## 2019-07-21 DIAGNOSIS — G510 Bell's palsy: Secondary | ICD-10-CM | POA: Diagnosis not present

## 2019-07-21 DIAGNOSIS — I340 Nonrheumatic mitral (valve) insufficiency: Secondary | ICD-10-CM

## 2019-07-21 DIAGNOSIS — R300 Dysuria: Secondary | ICD-10-CM | POA: Diagnosis not present

## 2019-07-21 DIAGNOSIS — M25522 Pain in left elbow: Secondary | ICD-10-CM | POA: Diagnosis not present

## 2019-07-21 DIAGNOSIS — I10 Essential (primary) hypertension: Secondary | ICD-10-CM | POA: Diagnosis not present

## 2019-07-21 DIAGNOSIS — R29705 NIHSS score 5: Secondary | ICD-10-CM | POA: Diagnosis not present

## 2019-07-21 DIAGNOSIS — J449 Chronic obstructive pulmonary disease, unspecified: Secondary | ICD-10-CM | POA: Diagnosis not present

## 2019-07-21 DIAGNOSIS — H548 Legal blindness, as defined in USA: Secondary | ICD-10-CM | POA: Diagnosis not present

## 2019-07-21 DIAGNOSIS — I639 Cerebral infarction, unspecified: Secondary | ICD-10-CM | POA: Diagnosis not present

## 2019-07-21 DIAGNOSIS — E119 Type 2 diabetes mellitus without complications: Secondary | ICD-10-CM | POA: Diagnosis not present

## 2019-07-21 DIAGNOSIS — E785 Hyperlipidemia, unspecified: Secondary | ICD-10-CM | POA: Diagnosis not present

## 2019-07-21 DIAGNOSIS — F17210 Nicotine dependence, cigarettes, uncomplicated: Secondary | ICD-10-CM | POA: Diagnosis not present

## 2019-07-21 DIAGNOSIS — R001 Bradycardia, unspecified: Secondary | ICD-10-CM | POA: Diagnosis not present

## 2019-07-21 DIAGNOSIS — Z79899 Other long term (current) drug therapy: Secondary | ICD-10-CM | POA: Diagnosis not present

## 2019-07-21 DIAGNOSIS — I361 Nonrheumatic tricuspid (valve) insufficiency: Secondary | ICD-10-CM

## 2019-07-21 DIAGNOSIS — Z7982 Long term (current) use of aspirin: Secondary | ICD-10-CM | POA: Diagnosis not present

## 2019-07-21 DIAGNOSIS — S59902A Unspecified injury of left elbow, initial encounter: Secondary | ICD-10-CM | POA: Diagnosis not present

## 2019-07-21 DIAGNOSIS — D649 Anemia, unspecified: Secondary | ICD-10-CM | POA: Diagnosis not present

## 2019-07-21 DIAGNOSIS — K219 Gastro-esophageal reflux disease without esophagitis: Secondary | ICD-10-CM | POA: Diagnosis not present

## 2019-07-22 DIAGNOSIS — I639 Cerebral infarction, unspecified: Secondary | ICD-10-CM | POA: Diagnosis not present

## 2019-07-22 DIAGNOSIS — D649 Anemia, unspecified: Secondary | ICD-10-CM | POA: Diagnosis not present

## 2019-07-23 DIAGNOSIS — I639 Cerebral infarction, unspecified: Secondary | ICD-10-CM | POA: Diagnosis not present

## 2019-07-23 DIAGNOSIS — D649 Anemia, unspecified: Secondary | ICD-10-CM | POA: Diagnosis not present

## 2019-07-24 DIAGNOSIS — Z85038 Personal history of other malignant neoplasm of large intestine: Secondary | ICD-10-CM | POA: Diagnosis not present

## 2019-07-24 DIAGNOSIS — G43909 Migraine, unspecified, not intractable, without status migrainosus: Secondary | ICD-10-CM | POA: Diagnosis not present

## 2019-07-24 DIAGNOSIS — H547 Unspecified visual loss: Secondary | ICD-10-CM | POA: Diagnosis not present

## 2019-07-24 DIAGNOSIS — D649 Anemia, unspecified: Secondary | ICD-10-CM | POA: Diagnosis not present

## 2019-07-24 DIAGNOSIS — I700 Atherosclerosis of aorta: Secondary | ICD-10-CM | POA: Diagnosis not present

## 2019-07-24 DIAGNOSIS — M19012 Primary osteoarthritis, left shoulder: Secondary | ICD-10-CM | POA: Diagnosis not present

## 2019-07-24 DIAGNOSIS — Z9181 History of falling: Secondary | ICD-10-CM | POA: Diagnosis not present

## 2019-07-24 DIAGNOSIS — J189 Pneumonia, unspecified organism: Secondary | ICD-10-CM | POA: Diagnosis not present

## 2019-07-24 DIAGNOSIS — M179 Osteoarthritis of knee, unspecified: Secondary | ICD-10-CM | POA: Diagnosis not present

## 2019-07-24 DIAGNOSIS — Z7982 Long term (current) use of aspirin: Secondary | ICD-10-CM | POA: Diagnosis not present

## 2019-07-24 DIAGNOSIS — K219 Gastro-esophageal reflux disease without esophagitis: Secondary | ICD-10-CM | POA: Diagnosis not present

## 2019-07-24 DIAGNOSIS — Z79891 Long term (current) use of opiate analgesic: Secondary | ICD-10-CM | POA: Diagnosis not present

## 2019-07-24 DIAGNOSIS — Z7952 Long term (current) use of systemic steroids: Secondary | ICD-10-CM | POA: Diagnosis not present

## 2019-07-24 DIAGNOSIS — E1165 Type 2 diabetes mellitus with hyperglycemia: Secondary | ICD-10-CM | POA: Diagnosis not present

## 2019-07-24 DIAGNOSIS — J441 Chronic obstructive pulmonary disease with (acute) exacerbation: Secondary | ICD-10-CM | POA: Diagnosis not present

## 2019-07-24 DIAGNOSIS — I110 Hypertensive heart disease with heart failure: Secondary | ICD-10-CM | POA: Diagnosis not present

## 2019-07-24 DIAGNOSIS — N390 Urinary tract infection, site not specified: Secondary | ICD-10-CM | POA: Diagnosis not present

## 2019-07-24 DIAGNOSIS — E785 Hyperlipidemia, unspecified: Secondary | ICD-10-CM | POA: Diagnosis not present

## 2019-07-24 DIAGNOSIS — I081 Rheumatic disorders of both mitral and tricuspid valves: Secondary | ICD-10-CM | POA: Diagnosis not present

## 2019-07-24 DIAGNOSIS — J440 Chronic obstructive pulmonary disease with acute lower respiratory infection: Secondary | ICD-10-CM | POA: Diagnosis not present

## 2019-07-24 DIAGNOSIS — G510 Bell's palsy: Secondary | ICD-10-CM | POA: Diagnosis not present

## 2019-07-24 DIAGNOSIS — I5032 Chronic diastolic (congestive) heart failure: Secondary | ICD-10-CM | POA: Diagnosis not present

## 2019-07-24 DIAGNOSIS — E7800 Pure hypercholesterolemia, unspecified: Secondary | ICD-10-CM | POA: Diagnosis not present

## 2019-07-26 DIAGNOSIS — Z7982 Long term (current) use of aspirin: Secondary | ICD-10-CM | POA: Diagnosis not present

## 2019-07-26 DIAGNOSIS — M19012 Primary osteoarthritis, left shoulder: Secondary | ICD-10-CM | POA: Diagnosis not present

## 2019-07-26 DIAGNOSIS — D649 Anemia, unspecified: Secondary | ICD-10-CM | POA: Diagnosis not present

## 2019-07-26 DIAGNOSIS — G510 Bell's palsy: Secondary | ICD-10-CM | POA: Diagnosis not present

## 2019-07-26 DIAGNOSIS — I5032 Chronic diastolic (congestive) heart failure: Secondary | ICD-10-CM | POA: Diagnosis not present

## 2019-07-26 DIAGNOSIS — I700 Atherosclerosis of aorta: Secondary | ICD-10-CM | POA: Diagnosis not present

## 2019-07-26 DIAGNOSIS — E7800 Pure hypercholesterolemia, unspecified: Secondary | ICD-10-CM | POA: Diagnosis not present

## 2019-07-26 DIAGNOSIS — I081 Rheumatic disorders of both mitral and tricuspid valves: Secondary | ICD-10-CM | POA: Diagnosis not present

## 2019-07-26 DIAGNOSIS — N390 Urinary tract infection, site not specified: Secondary | ICD-10-CM | POA: Diagnosis not present

## 2019-07-26 DIAGNOSIS — M179 Osteoarthritis of knee, unspecified: Secondary | ICD-10-CM | POA: Diagnosis not present

## 2019-07-26 DIAGNOSIS — Z7952 Long term (current) use of systemic steroids: Secondary | ICD-10-CM | POA: Diagnosis not present

## 2019-07-26 DIAGNOSIS — J441 Chronic obstructive pulmonary disease with (acute) exacerbation: Secondary | ICD-10-CM | POA: Diagnosis not present

## 2019-07-26 DIAGNOSIS — I110 Hypertensive heart disease with heart failure: Secondary | ICD-10-CM | POA: Diagnosis not present

## 2019-07-26 DIAGNOSIS — H547 Unspecified visual loss: Secondary | ICD-10-CM | POA: Diagnosis not present

## 2019-07-26 DIAGNOSIS — Z79891 Long term (current) use of opiate analgesic: Secondary | ICD-10-CM | POA: Diagnosis not present

## 2019-07-26 DIAGNOSIS — Z9181 History of falling: Secondary | ICD-10-CM | POA: Diagnosis not present

## 2019-07-26 DIAGNOSIS — E1165 Type 2 diabetes mellitus with hyperglycemia: Secondary | ICD-10-CM | POA: Diagnosis not present

## 2019-07-26 DIAGNOSIS — K219 Gastro-esophageal reflux disease without esophagitis: Secondary | ICD-10-CM | POA: Diagnosis not present

## 2019-07-26 DIAGNOSIS — Z85038 Personal history of other malignant neoplasm of large intestine: Secondary | ICD-10-CM | POA: Diagnosis not present

## 2019-07-26 DIAGNOSIS — J440 Chronic obstructive pulmonary disease with acute lower respiratory infection: Secondary | ICD-10-CM | POA: Diagnosis not present

## 2019-07-26 DIAGNOSIS — G43909 Migraine, unspecified, not intractable, without status migrainosus: Secondary | ICD-10-CM | POA: Diagnosis not present

## 2019-07-26 DIAGNOSIS — E785 Hyperlipidemia, unspecified: Secondary | ICD-10-CM | POA: Diagnosis not present

## 2019-07-26 DIAGNOSIS — J189 Pneumonia, unspecified organism: Secondary | ICD-10-CM | POA: Diagnosis not present

## 2019-07-27 DIAGNOSIS — Z7952 Long term (current) use of systemic steroids: Secondary | ICD-10-CM | POA: Diagnosis not present

## 2019-07-27 DIAGNOSIS — Z7982 Long term (current) use of aspirin: Secondary | ICD-10-CM | POA: Diagnosis not present

## 2019-07-27 DIAGNOSIS — J440 Chronic obstructive pulmonary disease with acute lower respiratory infection: Secondary | ICD-10-CM | POA: Diagnosis not present

## 2019-07-27 DIAGNOSIS — J441 Chronic obstructive pulmonary disease with (acute) exacerbation: Secondary | ICD-10-CM | POA: Diagnosis not present

## 2019-07-27 DIAGNOSIS — M19012 Primary osteoarthritis, left shoulder: Secondary | ICD-10-CM | POA: Diagnosis not present

## 2019-07-27 DIAGNOSIS — I110 Hypertensive heart disease with heart failure: Secondary | ICD-10-CM | POA: Diagnosis not present

## 2019-07-27 DIAGNOSIS — E7800 Pure hypercholesterolemia, unspecified: Secondary | ICD-10-CM | POA: Diagnosis not present

## 2019-07-27 DIAGNOSIS — E1165 Type 2 diabetes mellitus with hyperglycemia: Secondary | ICD-10-CM | POA: Diagnosis not present

## 2019-07-27 DIAGNOSIS — I5032 Chronic diastolic (congestive) heart failure: Secondary | ICD-10-CM | POA: Diagnosis not present

## 2019-07-27 DIAGNOSIS — Z9181 History of falling: Secondary | ICD-10-CM | POA: Diagnosis not present

## 2019-07-27 DIAGNOSIS — I081 Rheumatic disorders of both mitral and tricuspid valves: Secondary | ICD-10-CM | POA: Diagnosis not present

## 2019-07-27 DIAGNOSIS — K219 Gastro-esophageal reflux disease without esophagitis: Secondary | ICD-10-CM | POA: Diagnosis not present

## 2019-07-27 DIAGNOSIS — M179 Osteoarthritis of knee, unspecified: Secondary | ICD-10-CM | POA: Diagnosis not present

## 2019-07-27 DIAGNOSIS — D649 Anemia, unspecified: Secondary | ICD-10-CM | POA: Diagnosis not present

## 2019-07-27 DIAGNOSIS — Z79891 Long term (current) use of opiate analgesic: Secondary | ICD-10-CM | POA: Diagnosis not present

## 2019-07-27 DIAGNOSIS — G510 Bell's palsy: Secondary | ICD-10-CM | POA: Diagnosis not present

## 2019-07-27 DIAGNOSIS — E785 Hyperlipidemia, unspecified: Secondary | ICD-10-CM | POA: Diagnosis not present

## 2019-07-27 DIAGNOSIS — I700 Atherosclerosis of aorta: Secondary | ICD-10-CM | POA: Diagnosis not present

## 2019-07-27 DIAGNOSIS — H547 Unspecified visual loss: Secondary | ICD-10-CM | POA: Diagnosis not present

## 2019-07-27 DIAGNOSIS — G43909 Migraine, unspecified, not intractable, without status migrainosus: Secondary | ICD-10-CM | POA: Diagnosis not present

## 2019-07-27 DIAGNOSIS — N390 Urinary tract infection, site not specified: Secondary | ICD-10-CM | POA: Diagnosis not present

## 2019-07-27 DIAGNOSIS — J189 Pneumonia, unspecified organism: Secondary | ICD-10-CM | POA: Diagnosis not present

## 2019-07-27 DIAGNOSIS — Z85038 Personal history of other malignant neoplasm of large intestine: Secondary | ICD-10-CM | POA: Diagnosis not present

## 2019-07-31 ENCOUNTER — Encounter: Payer: Self-pay | Admitting: Cardiology

## 2019-07-31 ENCOUNTER — Ambulatory Visit (INDEPENDENT_AMBULATORY_CARE_PROVIDER_SITE_OTHER): Payer: Medicare Other | Admitting: Cardiology

## 2019-07-31 ENCOUNTER — Other Ambulatory Visit: Payer: Self-pay

## 2019-07-31 VITALS — BP 138/72 | HR 65 | Ht 62.0 in | Wt 129.0 lb

## 2019-07-31 DIAGNOSIS — G510 Bell's palsy: Secondary | ICD-10-CM

## 2019-07-31 DIAGNOSIS — E782 Mixed hyperlipidemia: Secondary | ICD-10-CM

## 2019-07-31 DIAGNOSIS — D649 Anemia, unspecified: Secondary | ICD-10-CM | POA: Diagnosis not present

## 2019-07-31 DIAGNOSIS — J441 Chronic obstructive pulmonary disease with (acute) exacerbation: Secondary | ICD-10-CM | POA: Diagnosis not present

## 2019-07-31 DIAGNOSIS — Z7982 Long term (current) use of aspirin: Secondary | ICD-10-CM | POA: Diagnosis not present

## 2019-07-31 DIAGNOSIS — J189 Pneumonia, unspecified organism: Secondary | ICD-10-CM | POA: Diagnosis not present

## 2019-07-31 DIAGNOSIS — I110 Hypertensive heart disease with heart failure: Secondary | ICD-10-CM | POA: Diagnosis not present

## 2019-07-31 DIAGNOSIS — I5032 Chronic diastolic (congestive) heart failure: Secondary | ICD-10-CM | POA: Diagnosis not present

## 2019-07-31 DIAGNOSIS — I25118 Atherosclerotic heart disease of native coronary artery with other forms of angina pectoris: Secondary | ICD-10-CM

## 2019-07-31 DIAGNOSIS — Z85038 Personal history of other malignant neoplasm of large intestine: Secondary | ICD-10-CM | POA: Diagnosis not present

## 2019-07-31 DIAGNOSIS — N390 Urinary tract infection, site not specified: Secondary | ICD-10-CM | POA: Diagnosis not present

## 2019-07-31 DIAGNOSIS — G43909 Migraine, unspecified, not intractable, without status migrainosus: Secondary | ICD-10-CM | POA: Diagnosis not present

## 2019-07-31 DIAGNOSIS — I10 Essential (primary) hypertension: Secondary | ICD-10-CM | POA: Diagnosis not present

## 2019-07-31 DIAGNOSIS — J440 Chronic obstructive pulmonary disease with acute lower respiratory infection: Secondary | ICD-10-CM | POA: Diagnosis not present

## 2019-07-31 DIAGNOSIS — Z79891 Long term (current) use of opiate analgesic: Secondary | ICD-10-CM | POA: Diagnosis not present

## 2019-07-31 DIAGNOSIS — M19012 Primary osteoarthritis, left shoulder: Secondary | ICD-10-CM | POA: Diagnosis not present

## 2019-07-31 DIAGNOSIS — E7800 Pure hypercholesterolemia, unspecified: Secondary | ICD-10-CM | POA: Diagnosis not present

## 2019-07-31 DIAGNOSIS — E785 Hyperlipidemia, unspecified: Secondary | ICD-10-CM | POA: Diagnosis not present

## 2019-07-31 DIAGNOSIS — Z7952 Long term (current) use of systemic steroids: Secondary | ICD-10-CM | POA: Diagnosis not present

## 2019-07-31 DIAGNOSIS — I700 Atherosclerosis of aorta: Secondary | ICD-10-CM | POA: Diagnosis not present

## 2019-07-31 DIAGNOSIS — E119 Type 2 diabetes mellitus without complications: Secondary | ICD-10-CM | POA: Diagnosis not present

## 2019-07-31 DIAGNOSIS — Z9181 History of falling: Secondary | ICD-10-CM | POA: Diagnosis not present

## 2019-07-31 DIAGNOSIS — M179 Osteoarthritis of knee, unspecified: Secondary | ICD-10-CM | POA: Diagnosis not present

## 2019-07-31 DIAGNOSIS — K219 Gastro-esophageal reflux disease without esophagitis: Secondary | ICD-10-CM | POA: Diagnosis not present

## 2019-07-31 DIAGNOSIS — I471 Supraventricular tachycardia: Secondary | ICD-10-CM | POA: Diagnosis not present

## 2019-07-31 DIAGNOSIS — E1165 Type 2 diabetes mellitus with hyperglycemia: Secondary | ICD-10-CM | POA: Diagnosis not present

## 2019-07-31 DIAGNOSIS — I081 Rheumatic disorders of both mitral and tricuspid valves: Secondary | ICD-10-CM | POA: Diagnosis not present

## 2019-07-31 DIAGNOSIS — H547 Unspecified visual loss: Secondary | ICD-10-CM | POA: Diagnosis not present

## 2019-07-31 HISTORY — DX: Bell's palsy: G51.0

## 2019-07-31 MED ORDER — RANOLAZINE ER 500 MG PO TB12: 1000.0000 mg | ORAL_TABLET | Freq: Two times a day (BID) | ORAL | Status: DC

## 2019-07-31 NOTE — Progress Notes (Signed)
Cardiology Office Note:    Date:  07/31/2019   ID:  Heather Mccoy, DOB 1945/08/17, MRN [id number]  PCP:  Ernestene Kiel, MD  Cardiologist:  Berniece Salines, DO  Electrophysiologist:  None   Referring MD: Ernestene Kiel, MD   Chief Complaint  Patient presents with  . Hospitalization Follow-up    History of Present Illness:    Heather Mccoy a 74 y.o.femalewith a hx of diabetes mellitus, colon cancer status post surgery, chronic diarrhea, anxiety, COPD not on oxygen, tobacco use, hypertension , CAD seen on Coronary CTA and hyperlipidemiapresents for follow-up visit.  I initially saw the patient onSeptember 4, 2020 at which time she was posthospitalization. She was hospitalized at [facility] in August where she was treated for syncope due to orthostatic hypotension. Her medications were optimized which included stopping her diuretics and she was started on midodrine and Florinef.On completion of our visit given her elevated blood pressure I did advise patient to only take her midodrine 5 mg 3 times daily if her systolic blood pressure was less than 120. Since her visit she has not needed to take the midodrine. She was also given a ZIO patch which she is still wearing to complete a 14-day monitoring. She was continued on the fludrocortisone 0.1 mg daily  The patient was seenon 02/05/2019.During that visit she was hypertensive and was still onfludrocortisone 0.1 mg daily. Started to titrate off the fludrocortisone. In addition the patient at the time did not want to start any antihypertensive medication. She was still wearing her Zio patch during the time of her visit.At her last visit October 14,2020 patient did have evidence of paroxysmal atrial tachycardiashe was started on carvedilol 6.25 mg twice daily.  He was seen on April 02, 2019 at which time she reported intermittent chest pain she described as left-sided burning sensation which radiated  to her left arm and also caused left arm numbness. During her visit we discussed getting a CTA coronaries.However at the conclusion of her visit the patient was being wanted checked out she did have an episode where she felt significantly lightheaded and was guided to the floor without falling or passing out.  The patient was taken to [facility] at that time thought to be vasovagal syncope.    On April 16, 2019 I saw the patient in follow-up visit and she is still having some chest pain but tells me it did respond to her Imdur, and her CCTA was scheduled.   I did see the patient on 07/13/19 to discuss her CCTA results which showed evidence of coronay artery disease. She was started on renaxa '500mg'$  BID. She was also hypertensive, therefore I increased her carvedilol to 12.5 mg twice daily.  The patient presented to [facility] in the interim due to left facial droop and weakness. She was diagnosed with bells palsy. She has been experiencing worsening diarrhea - she does have chronic diarrhea but she notes that this has gotten worse.   Today she tells me that her chest pain is improved but still is intermittent.   Past Medical History:  Diagnosis Date  . Anxiety   . Arthritis   . Cancer ([place])    colon  . COPD (chronic obstructive pulmonary disease) ([place])   . Coronary artery disease of native artery of native heart with stable angina pectoris ([place]) 06/11/2019  . Diabetes mellitus   . Dysrhythmia   . Essential hypertension 06/11/2019  . Hyperlipemia 01/19/2019  . Hyperlipidemia   . Hypertension   .  Mixed hyperlipidemia 06/11/2019  . Mood disorder ([place])   . Orthostatic hypotension   . Paroxysmal atrial tachycardia ([place]) 06/11/2019  . Shortness of breath   . Type II diabetes mellitus ([place]) 01/19/2019    Past Surgical History:  Procedure Laterality Date  . APPENDECTOMY    . CHOLECYSTECTOMY    . COLON SURGERY    . EUS  01/12/2012   Procedure: UPPER ENDOSCOPIC ULTRASOUND  (EUS) RADIAL;  Surgeon: Arta Silence, MD;  Location: [facility];  Service: Endoscopy;  Laterality: N/A;  to be admitted after  . REPLACEMENT TOTAL KNEE      Current Medications: Current Meds  Medication Sig  . Albuterol Sulfate 2.5 MG/0.5ML NEBU Inhale 2.5 mg into the lungs 3 (three) times daily as needed.  Marland Kitchen alendronate (FOSAMAX) 70 MG tablet Take 70 mg by mouth once a week. Take with a full glass of water on an empty stomach.  Marland Kitchen amLODipine (NORVASC) 2.5 MG tablet Take 1 tablet (2.5 mg total) by mouth daily.  Marland Kitchen aspirin EC 81 MG tablet Take 81 mg by mouth daily.  Marland Kitchen atorvastatin (LIPITOR) 40 MG tablet Take 40 mg by mouth daily.  . carvedilol (COREG) 12.5 MG tablet Take 1 tablet (12.5 mg total) by mouth 2 (two) times daily.  . citalopram (CELEXA) 10 MG tablet Take 10 mg by mouth daily.  . clopidogrel (PLAVIX) 75 MG tablet Take 75 mg by mouth daily.  . diclofenac sodium (VOLTAREN) 1 % GEL Apply 2 g topically 4 (four) times daily.  Marland Kitchen dicyclomine (BENTYL) 10 MG capsule Take 1 capsule by mouth 3 (three) times daily before meals.   . famotidine (PEPCID) 40 MG tablet Take 40 mg by mouth 2 (two) times daily.  . furosemide (LASIX) 20 MG tablet Take 20 mg by mouth daily as needed.  . hydrALAZINE (APRESOLINE) 50 MG tablet Take 50 mg by mouth 2 (two) times daily with a meal.  . hydrOXYzine (ATARAX/VISTARIL) 25 MG tablet Take 25 mg by mouth 3 (three) times daily as needed.  . loperamide (IMODIUM) 2 MG capsule Take 2 mg by mouth every 6 (six) hours as needed for diarrhea or loose stools.  Marland Kitchen LORazepam (ATIVAN) 0.5 MG tablet Take 1 mg by mouth daily as needed.   . meclizine (ANTIVERT) 25 MG tablet Take 25 mg by mouth every 6 (six) hours as needed for dizziness.  . montelukast (SINGULAIR) 10 MG tablet TAKE 1 TABLET BY MOUTH AT BEDTIME  . Naproxen Sodium (ALEVE) 220 MG CAPS Take 1 capsule by mouth every 6 (six) hours as needed.  . nitroGLYCERIN (NITROSTAT) 0.4 MG SL tablet Place 1 tablet (0.4 mg total)  under the tongue every 5 (five) minutes as needed for chest pain.  . pantoprazole (PROTONIX) 40 MG tablet Take 40 mg by mouth daily.  . potassium chloride SA (KLOR-CON) 20 MEQ tablet Take 20 mEq by mouth daily.  . promethazine (PHENERGAN) 25 MG tablet Take 1 tablet by mouth 3 (three) times daily as needed.  . ranolazine (RANEXA) 500 MG 12 hr tablet Take 1 tablet (500 mg total) by mouth 2 (two) times daily.  . traMADol (ULTRAM) 50 MG tablet Take 50 mg by mouth every 6 (six) hours as needed.  . [DISCONTINUED] midodrine (PROAMATINE) 5 MG tablet Take 5 mg by mouth 3 (three) times daily as needed.     Allergies:   Codeine, Morphine, Nitroglycerin, Tylenol [acetaminophen], and Oxycodone   Social History   Socioeconomic History  . Marital status: Widowed  Spouse name: Not on file  . Number of children: Not on file  . Years of education: Not on file  . Highest education level: Not on file  Occupational History  . Not on file  Tobacco Use  . Smoking status: Current Every Day Smoker    Packs/day: 0.50    Types: Cigarettes  . Smokeless tobacco: Never Used  Substance and Sexual Activity  . Alcohol use: No  . Drug use: No  . Sexual activity: Never  Other Topics Concern  . Not on file  Social History Narrative  . Not on file   Social Determinants of Health   Financial Resource Strain:   . Difficulty of Paying Living Expenses:   Food Insecurity:   . Worried About Charity fundraiser in the Last Year:   . Arboriculturist in the Last Year:   Transportation Needs:   . Film/video editor (Medical):   Marland Kitchen Lack of Transportation (Non-Medical):   Physical Activity:   . Days of Exercise per Week:   . Minutes of Exercise per Session:   Stress:   . Feeling of Stress :   Social Connections:   . Frequency of Communication with Friends and Family:   . Frequency of Social Gatherings with Friends and Family:   . Attends Religious Services:   . Active Member of Clubs or Organizations:   .  Attends Archivist Meetings:   Marland Kitchen Marital Status:      Family History: The patient's family history includes CVA in her father; Cerebral aneurysm in her mother; Heart attack in her father and sister.  ROS:   Review of Systems  Constitution: Negative for decreased appetite, fever and weight gain.  HENT: Negative for congestion, ear discharge, hoarse voice and sore throat.   Eyes: Negative for discharge, redness, vision loss in right eye and visual halos.  Cardiovascular: Negative for chest pain, dyspnea on exertion, leg swelling, orthopnea and palpitations.  Respiratory: Negative for cough, hemoptysis, shortness of breath and snoring.   Endocrine: Negative for heat intolerance and polyphagia.  Hematologic/Lymphatic: Negative for bleeding problem. Does not bruise/bleed easily.  Skin: Negative for flushing, nail changes, rash and suspicious lesions.  Musculoskeletal: Negative for arthritis, joint pain, muscle cramps, myalgias, neck pain and stiffness.  Gastrointestinal: Negative for abdominal pain, bowel incontinence, diarrhea and excessive appetite.  Genitourinary: Negative for decreased libido, genital sores and incomplete emptying.  Neurological: Negative for brief paralysis, focal weakness, headaches and loss of balance.  Psychiatric/Behavioral: Negative for altered mental status, depression and suicidal ideas.  Allergic/Immunologic: Negative for HIV exposure and persistent infections.    EKGs/Labs/Other Studies Reviewed:    The following studies were reviewed today:   EKG: None today  Transthoracic echocardiogram done in March 2021 showed normal left ventricular systolic function, EF 55 to 87%, grade 1 diastolic dysfunction.  Right ventricle is normal.  Left atrium is severely dilated.  Right atrium is mildly enlarged.  Aortic valve is trileaflet and normal.  Mild aortic valve sclerosis.  Mild mitral regurgitation.  Mitral cusp regurgitation.  Trace pulmonic regurgitation.   Aortic root, ascending aorta and aortic arch appear to be normal.  There is a small circumferential pericardial effusion.  Recent Labs: 01/26/2019: Magnesium 1.8 06/05/2019: BUN 11; Creatinine, Ser 0.53; Potassium 4.3; Sodium 141   Lab work done on July 20, 2019 at the [facility]: CBC: WBC 6.7, hemoglobin 10.5, hematocrit 33.2, platelet 150. Chemistry: Sodium 142, potassium 4.0, chloride 110, bicarb 22, BUN 17,  creatinine 0.7, glucose 06/05/2010, calcium 8.8, total bili 0.5, AST 29, ALT 12, alk phos 40, troponin less than 0.01 proBNP 271 Total protein 7.3, albumin 3.8  Recent Lipid Panel No results found for: CHOL, TRIG, HDL, CHOLHDL, VLDL, LDLCALC, LDLDIRECT  Physical Exam:    VS:  BP 138/72 (BP Location: Right Arm, Patient Position: Sitting, Cuff Size: Normal)   Pulse 65   Ht 5' 2"  (1.575 m)   Wt 129 lb (58.5 kg)   SpO2 98%   BMI 23.59 kg/m     Wt Readings from Last 3 Encounters:  07/31/19 129 lb (58.5 kg)  07/13/19 130 lb (59 kg)  06/11/19 131 lb 3.2 oz (59.5 kg)     GEN: Well nourished, well developed in no acute distress HEENT: Normal NECK: No JVD; No carotid bruits LYMPHATICS: No lymphadenopathy CARDIAC: S1S2 noted,RRR, no murmurs, rubs, gallops RESPIRATORY:  Clear to auscultation without rales, wheezing or rhonchi  ABDOMEN: Soft, non-tender, non-distended, +bowel sounds, no guarding. EXTREMITIES: No edema, No cyanosis, no clubbing MUSCULOSKELETAL:  No deformity  SKIN: Warm and dry NEUROLOGIC:  Alert and oriented x 3, non-focal PSYCHIATRIC:  Normal affect, good insight  ASSESSMENT:    1. Coronary artery disease of native artery of native heart with stable angina pectoris ([place])   2. Essential hypertension   3. Paroxysmal atrial tachycardia ([place])   4. Type 2 diabetes mellitus without complication, without long-term current use of insulin ([place])   5. Mixed hyperlipidemia   6. Bell's palsy    PLAN:     1.  CAD- her symptoms of chest pain is only improved  mildly - I will like to increase her Ranexa from 500 mg twice a day to 1000 mg twice a day.  She will remain on her aspirin 81 mg daily as well as her Lipitor 40 mg daily.  I am hoping optimizing her Ranexa will help improve her chest pain/anginal symptoms significantly.  2.  Hypertension her blood pressure is acceptable we will continue her current antihypertensive medication regimen which includes amlodipine 2.5 mg daily, carvedilol 12.5 mg twice a day, hydralazine 50 mg twice a day, Lasix 20 mg daily.  3. Hx of CVa - continue Aspirin 81 mg, Plavix started recently at her last hospitalization due to CVA. Continue with lipitor 2m daily.  4. Bells Palsy - continue prednisone and valtrex and advised per neurology.  5. HLN - continue with lipitor.  6. Type 2 DM - continue current regimen per pcp.  The patient is in agreement with the above plan. The patient left the office in stable condition.  The patient will follow up in 3 months.   Medication Adjustments/Labs and Tests Ordered: Current medicines are reviewed at length with the patient today.  Concerns regarding medicines are outlined above.  No orders of the defined types were placed in this encounter.  No orders of the defined types were placed in this encounter.   Patient Instructions  Medication Instructions:  Your physician has recommended you make the following change in your medication: INCREASE RENEXA TO 1000 MG BID  *If you need a refill on your cardiac medications before your next appointment, please call your pharmacy*   Lab Work: NONE If you have labs (blood work) drawn today and your tests are completely normal, you will receive your results only by: .Marland KitchenMyChart Message (if you have MyChart) OR . A paper copy in the mail If you have any lab test that is abnormal or we need to change  your treatment, we will call you to review the results.   Testing/Procedures: NONE   Follow-Up: At [facility], you and your  health needs are our priority.  As part of our continuing mission to provide you with exceptional heart care, we have created designated Provider Care Teams.  These Care Teams include your primary Cardiologist (physician) and Advanced Practice Providers (APPs -  Physician Assistants and Nurse Practitioners) who all work together to provide you with the care you need, when you need it.  We recommend signing up for the patient portal called "MyChart".  Sign up information is provided on this After Visit Summary.  MyChart is used to connect with patients for Virtual Visits (Telemedicine).  Patients are able to view lab/test results, encounter notes, upcoming appointments, etc.  Non-urgent messages can be sent to your provider as well.   To learn more about what you can do with MyChart, go to [email or web address].    Your next appointment:   3 month(s)  The format for your next appointment:   In Person  Provider:   Berniece Salines, DO   Other Instructions      Adopting a Healthy Lifestyle.  Know what a healthy weight is for you (roughly BMI <25) and aim to maintain this   Aim for 7+ servings of fruits and vegetables daily   65-80+ fluid ounces of water or unsweet tea for healthy kidneys   Limit to max 1 drink of alcohol per day; avoid smoking/tobacco   Limit animal fats in diet for cholesterol and heart health - choose grass fed whenever available   Avoid highly processed foods, and foods high in saturated/trans fats   Aim for low stress - take time to unwind and care for your mental health   Aim for 150 min of moderate intensity exercise weekly for heart health, and weights twice weekly for bone health   Aim for 7-9 hours of sleep daily   When it comes to diets, agreement about the perfect plan isnt easy to find, even among the experts. Experts at the [employer] developed an idea known as the Healthy Eating Plate. Just imagine a plate divided into logical,  healthy portions.   The emphasis is on diet quality:   Load up on vegetables and fruits - one-half of your plate: Aim for color and variety, and remember that potatoes dont count.   Go for whole grains - one-quarter of your plate: Whole wheat, barley, wheat berries, quinoa, oats, brown rice, and foods made with them. If you want pasta, go with whole wheat pasta.   Protein power - one-quarter of your plate: Fish, chicken, beans, and nuts are all healthy, versatile protein sources. Limit red meat.   The diet, however, does go beyond the plate, offering a few other suggestions.   Use healthy plant oils, such as olive, canola, soy, corn, sunflower and peanut. Check the labels, and avoid partially hydrogenated oil, which have unhealthy trans fats.   If youre thirsty, drink water. Coffee and tea are good in moderation, but skip sugary drinks and limit milk and dairy products to one or two daily servings.   The type of carbohydrate in the diet is more important than the amount. Some sources of carbohydrates, such as vegetables, fruits, whole grains, and beans-are healthier than others.   Finally, stay active  Signed, Berniece Salines, DO  07/31/2019 10:04 PM    [facility]

## 2019-07-31 NOTE — Patient Instructions (Signed)
Medication Instructions:  Your physician has recommended you make the following change in your medication: INCREASE RENEXA TO 1000 MG BID  *If you need a refill on your cardiac medications before your next appointment, please call your pharmacy*   Lab Work: NONE If you have labs (blood work) drawn today and your tests are completely normal, you will receive your results only by: Marland Kitchen MyChart Message (if you have MyChart) OR . A paper copy in the mail If you have any lab test that is abnormal or we need to change your treatment, we will call you to review the results.   Testing/Procedures: NONE   Follow-Up: At [facility], you and your health needs are our priority.  As part of our continuing mission to provide you with exceptional heart care, we have created designated Provider Care Teams.  These Care Teams include your primary Cardiologist (physician) and Advanced Practice Providers (APPs -  Physician Assistants and Nurse Practitioners) who all work together to provide you with the care you need, when you need it.  We recommend signing up for the patient portal called "MyChart".  Sign up information is provided on this After Visit Summary.  MyChart is used to connect with patients for Virtual Visits (Telemedicine).  Patients are able to view lab/test results, encounter notes, upcoming appointments, etc.  Non-urgent messages can be sent to your provider as well.   To learn more about what you can do with MyChart, go to [email or web address].    Your next appointment:   3 month(s)  The format for your next appointment:   In Person  Provider:   Berniece Salines, DO   Other Instructions

## 2019-08-01 ENCOUNTER — Telehealth: Payer: Self-pay | Admitting: Cardiology

## 2019-08-01 DIAGNOSIS — N390 Urinary tract infection, site not specified: Secondary | ICD-10-CM | POA: Diagnosis not present

## 2019-08-01 DIAGNOSIS — I110 Hypertensive heart disease with heart failure: Secondary | ICD-10-CM | POA: Diagnosis not present

## 2019-08-01 DIAGNOSIS — J441 Chronic obstructive pulmonary disease with (acute) exacerbation: Secondary | ICD-10-CM | POA: Diagnosis not present

## 2019-08-01 DIAGNOSIS — M179 Osteoarthritis of knee, unspecified: Secondary | ICD-10-CM | POA: Diagnosis not present

## 2019-08-01 DIAGNOSIS — G43909 Migraine, unspecified, not intractable, without status migrainosus: Secondary | ICD-10-CM | POA: Diagnosis not present

## 2019-08-01 DIAGNOSIS — E785 Hyperlipidemia, unspecified: Secondary | ICD-10-CM | POA: Diagnosis not present

## 2019-08-01 DIAGNOSIS — E7800 Pure hypercholesterolemia, unspecified: Secondary | ICD-10-CM | POA: Diagnosis not present

## 2019-08-01 DIAGNOSIS — G510 Bell's palsy: Secondary | ICD-10-CM | POA: Diagnosis not present

## 2019-08-01 DIAGNOSIS — M19012 Primary osteoarthritis, left shoulder: Secondary | ICD-10-CM | POA: Diagnosis not present

## 2019-08-01 DIAGNOSIS — Z79891 Long term (current) use of opiate analgesic: Secondary | ICD-10-CM | POA: Diagnosis not present

## 2019-08-01 DIAGNOSIS — D649 Anemia, unspecified: Secondary | ICD-10-CM | POA: Diagnosis not present

## 2019-08-01 DIAGNOSIS — J440 Chronic obstructive pulmonary disease with acute lower respiratory infection: Secondary | ICD-10-CM | POA: Diagnosis not present

## 2019-08-01 DIAGNOSIS — Z85038 Personal history of other malignant neoplasm of large intestine: Secondary | ICD-10-CM | POA: Diagnosis not present

## 2019-08-01 DIAGNOSIS — H547 Unspecified visual loss: Secondary | ICD-10-CM | POA: Diagnosis not present

## 2019-08-01 DIAGNOSIS — J189 Pneumonia, unspecified organism: Secondary | ICD-10-CM | POA: Diagnosis not present

## 2019-08-01 DIAGNOSIS — Z9181 History of falling: Secondary | ICD-10-CM | POA: Diagnosis not present

## 2019-08-01 DIAGNOSIS — I081 Rheumatic disorders of both mitral and tricuspid valves: Secondary | ICD-10-CM | POA: Diagnosis not present

## 2019-08-01 DIAGNOSIS — K219 Gastro-esophageal reflux disease without esophagitis: Secondary | ICD-10-CM | POA: Diagnosis not present

## 2019-08-01 DIAGNOSIS — I700 Atherosclerosis of aorta: Secondary | ICD-10-CM | POA: Diagnosis not present

## 2019-08-01 DIAGNOSIS — Z7952 Long term (current) use of systemic steroids: Secondary | ICD-10-CM | POA: Diagnosis not present

## 2019-08-01 DIAGNOSIS — E1165 Type 2 diabetes mellitus with hyperglycemia: Secondary | ICD-10-CM | POA: Diagnosis not present

## 2019-08-01 DIAGNOSIS — I5032 Chronic diastolic (congestive) heart failure: Secondary | ICD-10-CM | POA: Diagnosis not present

## 2019-08-01 DIAGNOSIS — Z7982 Long term (current) use of aspirin: Secondary | ICD-10-CM | POA: Diagnosis not present

## 2019-08-01 NOTE — Telephone Encounter (Signed)
Forwarded to [facility] Triage

## 2019-08-01 NOTE — Telephone Encounter (Signed)
New message   Patient had a reaction to ranolazine (RANEXA) 12 hr tablet 1,000 mg she is having nausea, vomiting, fatigue, unsteadiness with taking this medication. Please call Debbie the nurse at [facility] her phone # [phone].

## 2019-08-01 NOTE — Telephone Encounter (Signed)
Left message to call office

## 2019-08-02 DIAGNOSIS — J440 Chronic obstructive pulmonary disease with acute lower respiratory infection: Secondary | ICD-10-CM | POA: Diagnosis not present

## 2019-08-02 DIAGNOSIS — J189 Pneumonia, unspecified organism: Secondary | ICD-10-CM | POA: Diagnosis not present

## 2019-08-02 DIAGNOSIS — G510 Bell's palsy: Secondary | ICD-10-CM | POA: Diagnosis not present

## 2019-08-02 DIAGNOSIS — J441 Chronic obstructive pulmonary disease with (acute) exacerbation: Secondary | ICD-10-CM | POA: Diagnosis not present

## 2019-08-02 DIAGNOSIS — N390 Urinary tract infection, site not specified: Secondary | ICD-10-CM | POA: Diagnosis not present

## 2019-08-03 DIAGNOSIS — Z79891 Long term (current) use of opiate analgesic: Secondary | ICD-10-CM | POA: Diagnosis not present

## 2019-08-03 DIAGNOSIS — I081 Rheumatic disorders of both mitral and tricuspid valves: Secondary | ICD-10-CM | POA: Diagnosis not present

## 2019-08-03 DIAGNOSIS — I700 Atherosclerosis of aorta: Secondary | ICD-10-CM | POA: Diagnosis not present

## 2019-08-03 DIAGNOSIS — E1165 Type 2 diabetes mellitus with hyperglycemia: Secondary | ICD-10-CM | POA: Diagnosis not present

## 2019-08-03 DIAGNOSIS — K219 Gastro-esophageal reflux disease without esophagitis: Secondary | ICD-10-CM | POA: Diagnosis not present

## 2019-08-03 DIAGNOSIS — J440 Chronic obstructive pulmonary disease with acute lower respiratory infection: Secondary | ICD-10-CM | POA: Diagnosis not present

## 2019-08-03 DIAGNOSIS — Z7982 Long term (current) use of aspirin: Secondary | ICD-10-CM | POA: Diagnosis not present

## 2019-08-03 DIAGNOSIS — H547 Unspecified visual loss: Secondary | ICD-10-CM | POA: Diagnosis not present

## 2019-08-03 DIAGNOSIS — G510 Bell's palsy: Secondary | ICD-10-CM | POA: Diagnosis not present

## 2019-08-03 DIAGNOSIS — N390 Urinary tract infection, site not specified: Secondary | ICD-10-CM | POA: Diagnosis not present

## 2019-08-03 DIAGNOSIS — G43909 Migraine, unspecified, not intractable, without status migrainosus: Secondary | ICD-10-CM | POA: Diagnosis not present

## 2019-08-03 DIAGNOSIS — M179 Osteoarthritis of knee, unspecified: Secondary | ICD-10-CM | POA: Diagnosis not present

## 2019-08-03 DIAGNOSIS — D649 Anemia, unspecified: Secondary | ICD-10-CM | POA: Diagnosis not present

## 2019-08-03 DIAGNOSIS — J189 Pneumonia, unspecified organism: Secondary | ICD-10-CM | POA: Diagnosis not present

## 2019-08-03 DIAGNOSIS — Z9181 History of falling: Secondary | ICD-10-CM | POA: Diagnosis not present

## 2019-08-03 DIAGNOSIS — M19012 Primary osteoarthritis, left shoulder: Secondary | ICD-10-CM | POA: Diagnosis not present

## 2019-08-03 DIAGNOSIS — Z85038 Personal history of other malignant neoplasm of large intestine: Secondary | ICD-10-CM | POA: Diagnosis not present

## 2019-08-03 DIAGNOSIS — Z7952 Long term (current) use of systemic steroids: Secondary | ICD-10-CM | POA: Diagnosis not present

## 2019-08-03 DIAGNOSIS — E785 Hyperlipidemia, unspecified: Secondary | ICD-10-CM | POA: Diagnosis not present

## 2019-08-03 DIAGNOSIS — J441 Chronic obstructive pulmonary disease with (acute) exacerbation: Secondary | ICD-10-CM | POA: Diagnosis not present

## 2019-08-03 DIAGNOSIS — E7800 Pure hypercholesterolemia, unspecified: Secondary | ICD-10-CM | POA: Diagnosis not present

## 2019-08-03 DIAGNOSIS — I5032 Chronic diastolic (congestive) heart failure: Secondary | ICD-10-CM | POA: Diagnosis not present

## 2019-08-03 DIAGNOSIS — I110 Hypertensive heart disease with heart failure: Secondary | ICD-10-CM | POA: Diagnosis not present

## 2019-08-07 DIAGNOSIS — Z7982 Long term (current) use of aspirin: Secondary | ICD-10-CM | POA: Diagnosis not present

## 2019-08-07 DIAGNOSIS — I081 Rheumatic disorders of both mitral and tricuspid valves: Secondary | ICD-10-CM | POA: Diagnosis not present

## 2019-08-07 DIAGNOSIS — E7800 Pure hypercholesterolemia, unspecified: Secondary | ICD-10-CM | POA: Diagnosis not present

## 2019-08-07 DIAGNOSIS — K219 Gastro-esophageal reflux disease without esophagitis: Secondary | ICD-10-CM | POA: Diagnosis not present

## 2019-08-07 DIAGNOSIS — N390 Urinary tract infection, site not specified: Secondary | ICD-10-CM | POA: Diagnosis not present

## 2019-08-07 DIAGNOSIS — D649 Anemia, unspecified: Secondary | ICD-10-CM | POA: Diagnosis not present

## 2019-08-07 DIAGNOSIS — Z9181 History of falling: Secondary | ICD-10-CM | POA: Diagnosis not present

## 2019-08-07 DIAGNOSIS — J189 Pneumonia, unspecified organism: Secondary | ICD-10-CM | POA: Diagnosis not present

## 2019-08-07 DIAGNOSIS — M179 Osteoarthritis of knee, unspecified: Secondary | ICD-10-CM | POA: Diagnosis not present

## 2019-08-07 DIAGNOSIS — M19012 Primary osteoarthritis, left shoulder: Secondary | ICD-10-CM | POA: Diagnosis not present

## 2019-08-07 DIAGNOSIS — I700 Atherosclerosis of aorta: Secondary | ICD-10-CM | POA: Diagnosis not present

## 2019-08-07 DIAGNOSIS — I110 Hypertensive heart disease with heart failure: Secondary | ICD-10-CM | POA: Diagnosis not present

## 2019-08-07 DIAGNOSIS — G43909 Migraine, unspecified, not intractable, without status migrainosus: Secondary | ICD-10-CM | POA: Diagnosis not present

## 2019-08-07 DIAGNOSIS — H547 Unspecified visual loss: Secondary | ICD-10-CM | POA: Diagnosis not present

## 2019-08-07 DIAGNOSIS — J441 Chronic obstructive pulmonary disease with (acute) exacerbation: Secondary | ICD-10-CM | POA: Diagnosis not present

## 2019-08-07 DIAGNOSIS — E785 Hyperlipidemia, unspecified: Secondary | ICD-10-CM | POA: Diagnosis not present

## 2019-08-07 DIAGNOSIS — G510 Bell's palsy: Secondary | ICD-10-CM | POA: Diagnosis not present

## 2019-08-07 DIAGNOSIS — J440 Chronic obstructive pulmonary disease with acute lower respiratory infection: Secondary | ICD-10-CM | POA: Diagnosis not present

## 2019-08-07 DIAGNOSIS — Z79891 Long term (current) use of opiate analgesic: Secondary | ICD-10-CM | POA: Diagnosis not present

## 2019-08-07 DIAGNOSIS — Z7952 Long term (current) use of systemic steroids: Secondary | ICD-10-CM | POA: Diagnosis not present

## 2019-08-07 DIAGNOSIS — I5032 Chronic diastolic (congestive) heart failure: Secondary | ICD-10-CM | POA: Diagnosis not present

## 2019-08-07 DIAGNOSIS — E1165 Type 2 diabetes mellitus with hyperglycemia: Secondary | ICD-10-CM | POA: Diagnosis not present

## 2019-08-07 DIAGNOSIS — Z85038 Personal history of other malignant neoplasm of large intestine: Secondary | ICD-10-CM | POA: Diagnosis not present

## 2019-08-09 MED ORDER — RANOLAZINE ER 500 MG PO TB12: 500.0000 mg | ORAL_TABLET | Freq: Two times a day (BID) | ORAL | 3 refills | Status: DC

## 2019-08-09 NOTE — Telephone Encounter (Signed)
Thank you they will be fine she can keep the Ranexa 500 mg twice a day.  Please let Jackelyn Poling know that this patient does have chronic diarrhea which she follows with a GI doctor for and has been treating her for years.

## 2019-08-09 NOTE — Telephone Encounter (Signed)
I spoke with Jackelyn Poling and gave her information from Dr Harriet Masson

## 2019-08-09 NOTE — Telephone Encounter (Signed)
I spoke with Debbie. Initial call last week was from therapist. Due to symptoms listed below on March 17 patient decreased Ranexa back to previous dose of 500 mg twice daily.  Debbie saw patient this past Tuesday. Dizziness has improved but patient continues to have diarrhea and vomiting. She has an appointment with PCP regarding these issues.

## 2019-08-09 NOTE — Addendum Note (Signed)
Addended by: Thompson Grayer on: 08/09/2019 10:33 AM   Modules accepted: Orders

## 2019-08-13 DIAGNOSIS — M179 Osteoarthritis of knee, unspecified: Secondary | ICD-10-CM | POA: Diagnosis not present

## 2019-08-13 DIAGNOSIS — I5032 Chronic diastolic (congestive) heart failure: Secondary | ICD-10-CM | POA: Diagnosis not present

## 2019-08-13 DIAGNOSIS — M19012 Primary osteoarthritis, left shoulder: Secondary | ICD-10-CM | POA: Diagnosis not present

## 2019-08-13 DIAGNOSIS — E7800 Pure hypercholesterolemia, unspecified: Secondary | ICD-10-CM | POA: Diagnosis not present

## 2019-08-13 DIAGNOSIS — Z85038 Personal history of other malignant neoplasm of large intestine: Secondary | ICD-10-CM | POA: Diagnosis not present

## 2019-08-13 DIAGNOSIS — J189 Pneumonia, unspecified organism: Secondary | ICD-10-CM | POA: Diagnosis not present

## 2019-08-13 DIAGNOSIS — E785 Hyperlipidemia, unspecified: Secondary | ICD-10-CM | POA: Diagnosis not present

## 2019-08-13 DIAGNOSIS — K219 Gastro-esophageal reflux disease without esophagitis: Secondary | ICD-10-CM | POA: Diagnosis not present

## 2019-08-13 DIAGNOSIS — Z9181 History of falling: Secondary | ICD-10-CM | POA: Diagnosis not present

## 2019-08-13 DIAGNOSIS — D649 Anemia, unspecified: Secondary | ICD-10-CM | POA: Diagnosis not present

## 2019-08-13 DIAGNOSIS — Z79891 Long term (current) use of opiate analgesic: Secondary | ICD-10-CM | POA: Diagnosis not present

## 2019-08-13 DIAGNOSIS — H547 Unspecified visual loss: Secondary | ICD-10-CM | POA: Diagnosis not present

## 2019-08-13 DIAGNOSIS — G43909 Migraine, unspecified, not intractable, without status migrainosus: Secondary | ICD-10-CM | POA: Diagnosis not present

## 2019-08-13 DIAGNOSIS — J440 Chronic obstructive pulmonary disease with acute lower respiratory infection: Secondary | ICD-10-CM | POA: Diagnosis not present

## 2019-08-13 DIAGNOSIS — E1165 Type 2 diabetes mellitus with hyperglycemia: Secondary | ICD-10-CM | POA: Diagnosis not present

## 2019-08-13 DIAGNOSIS — G510 Bell's palsy: Secondary | ICD-10-CM | POA: Diagnosis not present

## 2019-08-13 DIAGNOSIS — I081 Rheumatic disorders of both mitral and tricuspid valves: Secondary | ICD-10-CM | POA: Diagnosis not present

## 2019-08-13 DIAGNOSIS — J441 Chronic obstructive pulmonary disease with (acute) exacerbation: Secondary | ICD-10-CM | POA: Diagnosis not present

## 2019-08-13 DIAGNOSIS — I700 Atherosclerosis of aorta: Secondary | ICD-10-CM | POA: Diagnosis not present

## 2019-08-13 DIAGNOSIS — Z7952 Long term (current) use of systemic steroids: Secondary | ICD-10-CM | POA: Diagnosis not present

## 2019-08-13 DIAGNOSIS — I110 Hypertensive heart disease with heart failure: Secondary | ICD-10-CM | POA: Diagnosis not present

## 2019-08-13 DIAGNOSIS — N390 Urinary tract infection, site not specified: Secondary | ICD-10-CM | POA: Diagnosis not present

## 2019-08-13 DIAGNOSIS — Z7982 Long term (current) use of aspirin: Secondary | ICD-10-CM | POA: Diagnosis not present

## 2019-08-16 DIAGNOSIS — G510 Bell's palsy: Secondary | ICD-10-CM | POA: Diagnosis not present

## 2019-08-22 ENCOUNTER — Other Ambulatory Visit: Payer: Self-pay | Admitting: Family Medicine

## 2019-08-26 DIAGNOSIS — E860 Dehydration: Secondary | ICD-10-CM | POA: Diagnosis not present

## 2019-08-26 DIAGNOSIS — I517 Cardiomegaly: Secondary | ICD-10-CM | POA: Diagnosis not present

## 2019-08-26 DIAGNOSIS — I10 Essential (primary) hypertension: Secondary | ICD-10-CM | POA: Diagnosis not present

## 2019-08-26 DIAGNOSIS — J189 Pneumonia, unspecified organism: Secondary | ICD-10-CM | POA: Diagnosis not present

## 2019-08-26 DIAGNOSIS — R42 Dizziness and giddiness: Secondary | ICD-10-CM | POA: Diagnosis not present

## 2019-08-26 DIAGNOSIS — R9431 Abnormal electrocardiogram [ECG] [EKG]: Secondary | ICD-10-CM | POA: Diagnosis not present

## 2019-08-27 DIAGNOSIS — R9431 Abnormal electrocardiogram [ECG] [EKG]: Secondary | ICD-10-CM | POA: Diagnosis not present

## 2019-08-27 DIAGNOSIS — E860 Dehydration: Secondary | ICD-10-CM | POA: Diagnosis not present

## 2019-08-27 DIAGNOSIS — J449 Chronic obstructive pulmonary disease, unspecified: Secondary | ICD-10-CM | POA: Diagnosis not present

## 2019-08-27 DIAGNOSIS — J189 Pneumonia, unspecified organism: Secondary | ICD-10-CM | POA: Diagnosis not present

## 2019-08-27 DIAGNOSIS — E7800 Pure hypercholesterolemia, unspecified: Secondary | ICD-10-CM | POA: Diagnosis not present

## 2019-08-27 DIAGNOSIS — Z85038 Personal history of other malignant neoplasm of large intestine: Secondary | ICD-10-CM | POA: Diagnosis not present

## 2019-08-27 DIAGNOSIS — R55 Syncope and collapse: Secondary | ICD-10-CM | POA: Diagnosis not present

## 2019-08-27 DIAGNOSIS — M1710 Unilateral primary osteoarthritis, unspecified knee: Secondary | ICD-10-CM | POA: Diagnosis not present

## 2019-08-27 DIAGNOSIS — H8309 Labyrinthitis, unspecified ear: Secondary | ICD-10-CM | POA: Diagnosis not present

## 2019-08-27 DIAGNOSIS — R519 Headache, unspecified: Secondary | ICD-10-CM | POA: Diagnosis not present

## 2019-08-27 DIAGNOSIS — D649 Anemia, unspecified: Secondary | ICD-10-CM | POA: Diagnosis not present

## 2019-08-27 DIAGNOSIS — R42 Dizziness and giddiness: Secondary | ICD-10-CM | POA: Diagnosis not present

## 2019-08-27 DIAGNOSIS — I10 Essential (primary) hypertension: Secondary | ICD-10-CM | POA: Diagnosis not present

## 2019-08-27 DIAGNOSIS — Z7952 Long term (current) use of systemic steroids: Secondary | ICD-10-CM | POA: Diagnosis not present

## 2019-08-27 DIAGNOSIS — Z79899 Other long term (current) drug therapy: Secondary | ICD-10-CM | POA: Diagnosis not present

## 2019-08-27 DIAGNOSIS — G4762 Sleep related leg cramps: Secondary | ICD-10-CM | POA: Diagnosis not present

## 2019-08-27 DIAGNOSIS — Z886 Allergy status to analgesic agent status: Secondary | ICD-10-CM | POA: Diagnosis not present

## 2019-08-27 DIAGNOSIS — Z7982 Long term (current) use of aspirin: Secondary | ICD-10-CM | POA: Diagnosis not present

## 2019-08-27 DIAGNOSIS — E785 Hyperlipidemia, unspecified: Secondary | ICD-10-CM | POA: Diagnosis not present

## 2019-08-27 DIAGNOSIS — Z888 Allergy status to other drugs, medicaments and biological substances status: Secondary | ICD-10-CM | POA: Diagnosis not present

## 2019-08-27 DIAGNOSIS — G510 Bell's palsy: Secondary | ICD-10-CM | POA: Diagnosis not present

## 2019-08-27 DIAGNOSIS — R001 Bradycardia, unspecified: Secondary | ICD-10-CM | POA: Diagnosis not present

## 2019-08-27 DIAGNOSIS — R4781 Slurred speech: Secondary | ICD-10-CM | POA: Diagnosis not present

## 2019-08-27 DIAGNOSIS — Z9861 Coronary angioplasty status: Secondary | ICD-10-CM | POA: Diagnosis not present

## 2019-08-27 DIAGNOSIS — K219 Gastro-esophageal reflux disease without esophagitis: Secondary | ICD-10-CM | POA: Diagnosis not present

## 2019-08-27 DIAGNOSIS — I517 Cardiomegaly: Secondary | ICD-10-CM | POA: Diagnosis not present

## 2019-08-28 DIAGNOSIS — R9431 Abnormal electrocardiogram [ECG] [EKG]: Secondary | ICD-10-CM | POA: Diagnosis not present

## 2019-08-28 DIAGNOSIS — E860 Dehydration: Secondary | ICD-10-CM | POA: Diagnosis not present

## 2019-08-28 DIAGNOSIS — J189 Pneumonia, unspecified organism: Secondary | ICD-10-CM | POA: Diagnosis not present

## 2019-08-28 DIAGNOSIS — R42 Dizziness and giddiness: Secondary | ICD-10-CM | POA: Diagnosis not present

## 2019-08-29 DIAGNOSIS — R42 Dizziness and giddiness: Secondary | ICD-10-CM | POA: Diagnosis not present

## 2019-08-29 DIAGNOSIS — E860 Dehydration: Secondary | ICD-10-CM | POA: Diagnosis not present

## 2019-08-29 DIAGNOSIS — R9431 Abnormal electrocardiogram [ECG] [EKG]: Secondary | ICD-10-CM | POA: Diagnosis not present

## 2019-08-29 DIAGNOSIS — J189 Pneumonia, unspecified organism: Secondary | ICD-10-CM | POA: Diagnosis not present

## 2019-08-30 DIAGNOSIS — R42 Dizziness and giddiness: Secondary | ICD-10-CM | POA: Diagnosis not present

## 2019-08-30 DIAGNOSIS — R9431 Abnormal electrocardiogram [ECG] [EKG]: Secondary | ICD-10-CM | POA: Diagnosis not present

## 2019-08-30 DIAGNOSIS — E860 Dehydration: Secondary | ICD-10-CM | POA: Diagnosis not present

## 2019-08-30 DIAGNOSIS — J189 Pneumonia, unspecified organism: Secondary | ICD-10-CM | POA: Diagnosis not present

## 2019-08-31 DIAGNOSIS — R9431 Abnormal electrocardiogram [ECG] [EKG]: Secondary | ICD-10-CM | POA: Diagnosis not present

## 2019-08-31 DIAGNOSIS — J189 Pneumonia, unspecified organism: Secondary | ICD-10-CM | POA: Diagnosis not present

## 2019-08-31 DIAGNOSIS — E860 Dehydration: Secondary | ICD-10-CM | POA: Diagnosis not present

## 2019-08-31 DIAGNOSIS — R42 Dizziness and giddiness: Secondary | ICD-10-CM | POA: Diagnosis not present

## 2019-09-01 DIAGNOSIS — R9431 Abnormal electrocardiogram [ECG] [EKG]: Secondary | ICD-10-CM | POA: Diagnosis not present

## 2019-09-01 DIAGNOSIS — E860 Dehydration: Secondary | ICD-10-CM | POA: Diagnosis not present

## 2019-09-01 DIAGNOSIS — R42 Dizziness and giddiness: Secondary | ICD-10-CM | POA: Diagnosis not present

## 2019-09-01 DIAGNOSIS — J189 Pneumonia, unspecified organism: Secondary | ICD-10-CM | POA: Diagnosis not present

## 2019-09-02 DIAGNOSIS — I081 Rheumatic disorders of both mitral and tricuspid valves: Secondary | ICD-10-CM | POA: Diagnosis not present

## 2019-09-02 DIAGNOSIS — J441 Chronic obstructive pulmonary disease with (acute) exacerbation: Secondary | ICD-10-CM | POA: Diagnosis not present

## 2019-09-02 DIAGNOSIS — H8190 Unspecified disorder of vestibular function, unspecified ear: Secondary | ICD-10-CM | POA: Diagnosis not present

## 2019-09-02 DIAGNOSIS — E785 Hyperlipidemia, unspecified: Secondary | ICD-10-CM | POA: Diagnosis not present

## 2019-09-02 DIAGNOSIS — Z79891 Long term (current) use of opiate analgesic: Secondary | ICD-10-CM | POA: Diagnosis not present

## 2019-09-02 DIAGNOSIS — M179 Osteoarthritis of knee, unspecified: Secondary | ICD-10-CM | POA: Diagnosis not present

## 2019-09-02 DIAGNOSIS — Z9181 History of falling: Secondary | ICD-10-CM | POA: Diagnosis not present

## 2019-09-02 DIAGNOSIS — I110 Hypertensive heart disease with heart failure: Secondary | ICD-10-CM | POA: Diagnosis not present

## 2019-09-02 DIAGNOSIS — J181 Lobar pneumonia, unspecified organism: Secondary | ICD-10-CM | POA: Diagnosis not present

## 2019-09-02 DIAGNOSIS — E119 Type 2 diabetes mellitus without complications: Secondary | ICD-10-CM | POA: Diagnosis not present

## 2019-09-02 DIAGNOSIS — D649 Anemia, unspecified: Secondary | ICD-10-CM | POA: Diagnosis not present

## 2019-09-02 DIAGNOSIS — I700 Atherosclerosis of aorta: Secondary | ICD-10-CM | POA: Diagnosis not present

## 2019-09-02 DIAGNOSIS — K219 Gastro-esophageal reflux disease without esophagitis: Secondary | ICD-10-CM | POA: Diagnosis not present

## 2019-09-02 DIAGNOSIS — G43909 Migraine, unspecified, not intractable, without status migrainosus: Secondary | ICD-10-CM | POA: Diagnosis not present

## 2019-09-02 DIAGNOSIS — R42 Dizziness and giddiness: Secondary | ICD-10-CM | POA: Diagnosis not present

## 2019-09-02 DIAGNOSIS — Z7982 Long term (current) use of aspirin: Secondary | ICD-10-CM | POA: Diagnosis not present

## 2019-09-02 DIAGNOSIS — G510 Bell's palsy: Secondary | ICD-10-CM | POA: Diagnosis not present

## 2019-09-02 DIAGNOSIS — H547 Unspecified visual loss: Secondary | ICD-10-CM | POA: Diagnosis not present

## 2019-09-02 DIAGNOSIS — M5030 Other cervical disc degeneration, unspecified cervical region: Secondary | ICD-10-CM | POA: Diagnosis not present

## 2019-09-02 DIAGNOSIS — Z85038 Personal history of other malignant neoplasm of large intestine: Secondary | ICD-10-CM | POA: Diagnosis not present

## 2019-09-02 DIAGNOSIS — J440 Chronic obstructive pulmonary disease with acute lower respiratory infection: Secondary | ICD-10-CM | POA: Diagnosis not present

## 2019-09-02 DIAGNOSIS — M19012 Primary osteoarthritis, left shoulder: Secondary | ICD-10-CM | POA: Diagnosis not present

## 2019-09-02 DIAGNOSIS — I5032 Chronic diastolic (congestive) heart failure: Secondary | ICD-10-CM | POA: Diagnosis not present

## 2019-09-04 ENCOUNTER — Encounter: Payer: Self-pay | Admitting: Neurology

## 2019-09-04 DIAGNOSIS — Z7189 Other specified counseling: Secondary | ICD-10-CM | POA: Diagnosis not present

## 2019-09-04 DIAGNOSIS — R42 Dizziness and giddiness: Secondary | ICD-10-CM | POA: Diagnosis not present

## 2019-09-04 DIAGNOSIS — Z09 Encounter for follow-up examination after completed treatment for conditions other than malignant neoplasm: Secondary | ICD-10-CM | POA: Diagnosis not present

## 2019-09-04 DIAGNOSIS — Z79899 Other long term (current) drug therapy: Secondary | ICD-10-CM | POA: Diagnosis not present

## 2019-09-05 ENCOUNTER — Ambulatory Visit: Admitting: Neurology

## 2019-09-05 DIAGNOSIS — Z79891 Long term (current) use of opiate analgesic: Secondary | ICD-10-CM | POA: Diagnosis not present

## 2019-09-05 DIAGNOSIS — I5032 Chronic diastolic (congestive) heart failure: Secondary | ICD-10-CM | POA: Diagnosis not present

## 2019-09-05 DIAGNOSIS — Z7982 Long term (current) use of aspirin: Secondary | ICD-10-CM | POA: Diagnosis not present

## 2019-09-05 DIAGNOSIS — M179 Osteoarthritis of knee, unspecified: Secondary | ICD-10-CM | POA: Diagnosis not present

## 2019-09-05 DIAGNOSIS — Z9181 History of falling: Secondary | ICD-10-CM | POA: Diagnosis not present

## 2019-09-05 DIAGNOSIS — I081 Rheumatic disorders of both mitral and tricuspid valves: Secondary | ICD-10-CM | POA: Diagnosis not present

## 2019-09-05 DIAGNOSIS — E785 Hyperlipidemia, unspecified: Secondary | ICD-10-CM | POA: Diagnosis not present

## 2019-09-05 DIAGNOSIS — J440 Chronic obstructive pulmonary disease with acute lower respiratory infection: Secondary | ICD-10-CM | POA: Diagnosis not present

## 2019-09-05 DIAGNOSIS — J441 Chronic obstructive pulmonary disease with (acute) exacerbation: Secondary | ICD-10-CM | POA: Diagnosis not present

## 2019-09-05 DIAGNOSIS — H547 Unspecified visual loss: Secondary | ICD-10-CM | POA: Diagnosis not present

## 2019-09-05 DIAGNOSIS — H8190 Unspecified disorder of vestibular function, unspecified ear: Secondary | ICD-10-CM | POA: Diagnosis not present

## 2019-09-05 DIAGNOSIS — G43909 Migraine, unspecified, not intractable, without status migrainosus: Secondary | ICD-10-CM | POA: Diagnosis not present

## 2019-09-05 DIAGNOSIS — Z85038 Personal history of other malignant neoplasm of large intestine: Secondary | ICD-10-CM | POA: Diagnosis not present

## 2019-09-05 DIAGNOSIS — M19012 Primary osteoarthritis, left shoulder: Secondary | ICD-10-CM | POA: Diagnosis not present

## 2019-09-05 DIAGNOSIS — E119 Type 2 diabetes mellitus without complications: Secondary | ICD-10-CM | POA: Diagnosis not present

## 2019-09-05 DIAGNOSIS — M5030 Other cervical disc degeneration, unspecified cervical region: Secondary | ICD-10-CM | POA: Diagnosis not present

## 2019-09-05 DIAGNOSIS — J181 Lobar pneumonia, unspecified organism: Secondary | ICD-10-CM | POA: Diagnosis not present

## 2019-09-05 DIAGNOSIS — G510 Bell's palsy: Secondary | ICD-10-CM | POA: Diagnosis not present

## 2019-09-05 DIAGNOSIS — R42 Dizziness and giddiness: Secondary | ICD-10-CM | POA: Diagnosis not present

## 2019-09-05 DIAGNOSIS — K219 Gastro-esophageal reflux disease without esophagitis: Secondary | ICD-10-CM | POA: Diagnosis not present

## 2019-09-05 DIAGNOSIS — D649 Anemia, unspecified: Secondary | ICD-10-CM | POA: Diagnosis not present

## 2019-09-05 DIAGNOSIS — I110 Hypertensive heart disease with heart failure: Secondary | ICD-10-CM | POA: Diagnosis not present

## 2019-09-05 DIAGNOSIS — I700 Atherosclerosis of aorta: Secondary | ICD-10-CM | POA: Diagnosis not present

## 2019-09-13 DIAGNOSIS — G510 Bell's palsy: Secondary | ICD-10-CM | POA: Diagnosis not present

## 2019-09-13 DIAGNOSIS — H35353 Cystoid macular degeneration, bilateral: Secondary | ICD-10-CM | POA: Diagnosis not present

## 2019-09-17 DIAGNOSIS — I700 Atherosclerosis of aorta: Secondary | ICD-10-CM | POA: Diagnosis not present

## 2019-09-17 DIAGNOSIS — K219 Gastro-esophageal reflux disease without esophagitis: Secondary | ICD-10-CM | POA: Diagnosis not present

## 2019-09-17 DIAGNOSIS — H8190 Unspecified disorder of vestibular function, unspecified ear: Secondary | ICD-10-CM | POA: Diagnosis not present

## 2019-09-17 DIAGNOSIS — J441 Chronic obstructive pulmonary disease with (acute) exacerbation: Secondary | ICD-10-CM | POA: Diagnosis not present

## 2019-09-17 DIAGNOSIS — E785 Hyperlipidemia, unspecified: Secondary | ICD-10-CM | POA: Diagnosis not present

## 2019-09-17 DIAGNOSIS — I5032 Chronic diastolic (congestive) heart failure: Secondary | ICD-10-CM | POA: Diagnosis not present

## 2019-09-17 DIAGNOSIS — J181 Lobar pneumonia, unspecified organism: Secondary | ICD-10-CM | POA: Diagnosis not present

## 2019-09-17 DIAGNOSIS — R42 Dizziness and giddiness: Secondary | ICD-10-CM | POA: Diagnosis not present

## 2019-09-17 DIAGNOSIS — M5030 Other cervical disc degeneration, unspecified cervical region: Secondary | ICD-10-CM | POA: Diagnosis not present

## 2019-09-17 DIAGNOSIS — D649 Anemia, unspecified: Secondary | ICD-10-CM | POA: Diagnosis not present

## 2019-09-17 DIAGNOSIS — F17210 Nicotine dependence, cigarettes, uncomplicated: Secondary | ICD-10-CM | POA: Diagnosis not present

## 2019-09-17 DIAGNOSIS — G43909 Migraine, unspecified, not intractable, without status migrainosus: Secondary | ICD-10-CM | POA: Diagnosis not present

## 2019-09-17 DIAGNOSIS — E119 Type 2 diabetes mellitus without complications: Secondary | ICD-10-CM | POA: Diagnosis not present

## 2019-09-17 DIAGNOSIS — I081 Rheumatic disorders of both mitral and tricuspid valves: Secondary | ICD-10-CM | POA: Diagnosis not present

## 2019-09-17 DIAGNOSIS — M19012 Primary osteoarthritis, left shoulder: Secondary | ICD-10-CM | POA: Diagnosis not present

## 2019-09-17 DIAGNOSIS — Z9181 History of falling: Secondary | ICD-10-CM | POA: Diagnosis not present

## 2019-09-17 DIAGNOSIS — J440 Chronic obstructive pulmonary disease with acute lower respiratory infection: Secondary | ICD-10-CM | POA: Diagnosis not present

## 2019-09-17 DIAGNOSIS — M179 Osteoarthritis of knee, unspecified: Secondary | ICD-10-CM | POA: Diagnosis not present

## 2019-09-17 DIAGNOSIS — H547 Unspecified visual loss: Secondary | ICD-10-CM | POA: Diagnosis not present

## 2019-09-17 DIAGNOSIS — Z79891 Long term (current) use of opiate analgesic: Secondary | ICD-10-CM | POA: Diagnosis not present

## 2019-09-17 DIAGNOSIS — Z7982 Long term (current) use of aspirin: Secondary | ICD-10-CM | POA: Diagnosis not present

## 2019-09-17 DIAGNOSIS — I110 Hypertensive heart disease with heart failure: Secondary | ICD-10-CM | POA: Diagnosis not present

## 2019-09-17 DIAGNOSIS — G510 Bell's palsy: Secondary | ICD-10-CM | POA: Diagnosis not present

## 2019-09-17 DIAGNOSIS — Z85038 Personal history of other malignant neoplasm of large intestine: Secondary | ICD-10-CM | POA: Diagnosis not present

## 2019-09-18 ENCOUNTER — Ambulatory Visit (INDEPENDENT_AMBULATORY_CARE_PROVIDER_SITE_OTHER): Payer: Medicare Other | Admitting: Cardiology

## 2019-09-18 ENCOUNTER — Encounter: Payer: Self-pay | Admitting: Cardiology

## 2019-09-18 ENCOUNTER — Other Ambulatory Visit: Payer: Self-pay

## 2019-09-18 VITALS — BP 96/50 | HR 62 | Ht 62.0 in | Wt 135.0 lb

## 2019-09-18 DIAGNOSIS — I10 Essential (primary) hypertension: Secondary | ICD-10-CM | POA: Diagnosis not present

## 2019-09-18 DIAGNOSIS — E782 Mixed hyperlipidemia: Secondary | ICD-10-CM | POA: Diagnosis not present

## 2019-09-18 DIAGNOSIS — R42 Dizziness and giddiness: Secondary | ICD-10-CM

## 2019-09-18 HISTORY — DX: Dizziness and giddiness: R42

## 2019-09-18 MED ORDER — HYDRALAZINE HCL 25 MG PO TABS: 25.0000 mg | ORAL_TABLET | Freq: Two times a day (BID) | ORAL | 3 refills | Status: DC

## 2019-09-18 NOTE — Patient Instructions (Signed)
Medication Instructions:  Your physician has recommended you make the following change in your medication: 1. STOP: AMLODIPINE   2. DECREASE: HYDRALAZINE TO 25 MG TWICE DAILY  *If you need a refill on your cardiac medications before your next appointment, please call your pharmacy*   Lab Work: NONE  If you have labs (blood work) drawn today and your tests are completely normal, you will receive your results only by: Marland Kitchen MyChart Message (if you have MyChart) OR . A paper copy in the mail If you have any lab test that is abnormal or we need to change your treatment, we will call you to review the results.  Testing/Procedures: NONE   Follow-Up: At [facility], you and your health needs are our priority.  As part of our continuing mission to provide you with exceptional heart care, we have created designated Provider Care Teams.  These Care Teams include your primary Cardiologist (physician) and Advanced Practice Providers (APPs -  Physician Assistants and Nurse Practitioners) who all work together to provide you with the care you need, when you need it.  We recommend signing up for the patient portal called "MyChart".  Sign up information is provided on this After Visit Summary.  MyChart is used to connect with patients for Virtual Visits (Telemedicine).  Patients are able to view lab/test results, encounter notes, upcoming appointments, etc.  Non-urgent messages can be sent to your provider as well.   To learn more about what you can do with MyChart, go to [email or web address].    Your next appointment:   4 week(s)  The format for your next appointment:   In Person  Provider:   DR. Harriet Masson

## 2019-09-18 NOTE — Progress Notes (Signed)
Cardiology Office Note:    Date:  09/18/2019   ID:  KITINA DIVITA, DOB 13-Aug-1945, MRN [id number]  PCP:  Ernestene Kiel, MD  Cardiologist:  Berniece Salines, DO  Electrophysiologist:  None   Referring MD: Ernestene Kiel, MD   Chief Complaint  Patient presents with  . Hospitalization Follow-up    History of Present Illness:    Heather Mccoy a 74 y.o.femalewith a hx of diabetes mellitus, colon cancer status post surgery, chronic diarrhea, anxiety, COPD not on oxygen, tobacco use, hypertension , CAD seen on Coronary CTA and hyperlipidemiapresents for follow-up visit.  I initially saw the patient onSeptember 4, 2020 at which time she was posthospitalization. She was hospitalized at [facility] in August where she was treated for syncope due to orthostatic hypotension. Her medications were optimized which included stopping her diuretics and she was started on midodrine and Florinef.On completion of our visit given her elevated blood pressure I did advise patient to only take her midodrine 5 mg 3 times daily if her systolic blood pressure was less than 120. Since her visit she has not needed to take the midodrine. She was also given a ZIO patch which she is still wearing to complete a 14-day monitoring. She was continued on the fludrocortisone 0.1 mg daily  The patient was seenon 02/05/2019.During that visit she was hypertensive and was still onfludrocortisone 0.1 mg daily. Started to titrate off the fludrocortisone. In addition the patient at the time did not want to start any antihypertensive medication. She was still wearing her Zio patch during the time of her visit.At her last visit October 14,2020 patient did have evidence of paroxysmal atrial tachycardiashe was started on carvedilol 6.25 mg twice daily.  He was seen on April 02, 2019 at which time she reported intermittent chest pain she described as left-sided burning sensation which radiated  to her left arm and also caused left arm numbness. During her visit we discussed getting a CTA coronaries.However at the conclusion of her visit the patient was being wanted checked out she did have an episode where she felt significantly lightheaded and was guided to the floor without falling or passing out.The [facility] at that time thought to be vasovagal syncope.   On April 16, 2019 I saw the patient in follow-up visit and she is still having some chest pain but tells me it did respond to her Imdur, and herCCTA was scheduled.   I did see the patient on 07/13/19 to discuss her CCTA results which showed evidence of coronay artery disease. She was started on renaxa 500mg  BID. She was also hypertensive, therefore I increased her carvedilol to 12.5 mg twice daily.  The patient presented to [facility] in the interim due to left facial droop and weakness. She was diagnosed with bells palsy. She has been experiencing worsening diarrhea - she does have chronic diarrhea but she notes that this has gotten worse.    At her visit on July 31, 2019 at that time increase her Ranexa to 1000 mg twice a day.  In the interim she was admitted to the [facility] for pneumonia and was placed on antibiotic for a while.  She was started on amlodipine the patient tells me that since that time she has been dizzy.  She is here today for follow-up visit. She feels dizzy - but denies chest pain.    Past Medical History:  Diagnosis Date  . Anxiety   . Arthritis   .  Bell's palsy 07/31/2019  . Cancer ([place])    colon  . Chronic diarrhea of unknown origin 01/12/2012  . COPD (chronic obstructive pulmonary disease) ([place])   . Coronary artery disease of native artery of native heart with stable angina pectoris ([place]) 06/11/2019  . Diabetes mellitus   . Dysrhythmia   . Essential hypertension 06/11/2019  . Hyperlipemia 01/19/2019  . Hyperlipidemia   . Hypertension   . Mixed  hyperlipidemia 06/11/2019  . Mood disorder ([place])   . Normocytic anemia 09/12/2014  . Orthostatic hypotension   . Paroxysmal atrial tachycardia ([place]) 06/11/2019  . Shortness of breath   . Type II diabetes mellitus ([place]) 01/19/2019  . Vitamin B12 deficiency 09/13/2014  . Weight loss 09/12/2014    Past Surgical History:  Procedure Laterality Date  . APPENDECTOMY    . BACK SURGERY    . CHOLECYSTECTOMY    . COLON SURGERY    . EUS  01/12/2012   Procedure: UPPER ENDOSCOPIC ULTRASOUND (EUS) RADIAL;  Surgeon: Arta Silence, MD;  Location: [facility] ENDOSCOPY;  Service: Endoscopy;  Laterality: N/A;  to be admitted after  . HAND SURGERY    . REPLACEMENT TOTAL KNEE      Current Medications: Current Meds  Medication Sig  . Albuterol Sulfate 2.5 MG/0.5ML NEBU Inhale 2.5 mg into the lungs 3 (three) times daily as needed.  Marland Kitchen alendronate (FOSAMAX) 70 MG tablet Take 70 mg by mouth once a week. Take with a full glass of water on an empty stomach.  Marland Kitchen aspirin EC 81 MG tablet Take 81 mg by mouth daily.  Marland Kitchen atorvastatin (LIPITOR) 40 MG tablet Take 40 mg by mouth daily.  . carvedilol (COREG) 12.5 MG tablet Take 1 tablet (12.5 mg total) by mouth 2 (two) times daily.  . citalopram (CELEXA) 10 MG tablet Take 10 mg by mouth daily.  . diclofenac sodium (VOLTAREN) 1 % GEL Apply 2 g topically 4 (four) times daily.  Marland Kitchen dicyclomine (BENTYL) 10 MG capsule Take 1 capsule by mouth 3 (three) times daily before meals.   . famotidine (PEPCID) 40 MG tablet TAKE 1 TABLET BY MOUTH TWICE DAILY  . furosemide (LASIX) 20 MG tablet Take 20 mg by mouth daily as needed.  . hydrOXYzine (ATARAX/VISTARIL) 25 MG tablet Take 25 mg by mouth 3 (three) times daily as needed.  . loperamide (IMODIUM) 2 MG capsule Take 2 mg by mouth every 6 (six) hours as needed for diarrhea or loose stools.  Marland Kitchen LORazepam (ATIVAN) 0.5 MG tablet Take 1 mg by mouth daily as needed.   . meclizine (ANTIVERT) 25 MG tablet Take 25 mg by mouth every 6 (six) hours as needed for  dizziness.  . montelukast (SINGULAIR) 10 MG tablet TAKE 1 TABLET BY MOUTH AT BEDTIME  . Naproxen Sodium (ALEVE) 220 MG CAPS Take 1 capsule by mouth every 6 (six) hours as needed.  . pantoprazole (PROTONIX) 40 MG tablet Take 40 mg by mouth daily.  . potassium chloride SA (KLOR-CON) 20 MEQ tablet Take 20 mEq by mouth daily.  . promethazine (PHENERGAN) 25 MG tablet Take 1 tablet by mouth 3 (three) times daily as needed.  . ranolazine (RANEXA) 500 MG 12 hr tablet Take 1 tablet (500 mg total) by mouth 2 (two) times daily.  . traMADol (ULTRAM) 50 MG tablet Take 50 mg by mouth every 6 (six) hours as needed.  . [DISCONTINUED] amLODipine (NORVASC) 5 MG tablet Take 5 mg by mouth daily.  . [DISCONTINUED] clopidogrel (PLAVIX) 75 MG tablet Take 75  mg by mouth daily.  . [DISCONTINUED] hydrALAZINE (APRESOLINE) 50 MG tablet Take 50 mg by mouth 2 (two) times daily with a meal.  . [DISCONTINUED] predniSONE (DELTASONE) 20 MG tablet Take 20 mg by mouth in the morning, at noon, and at bedtime.  . [DISCONTINUED] valACYclovir (VALTREX) 1000 MG tablet Take 1,000 mg by mouth 3 (three) times daily.     Allergies:   Codeine, Morphine, Nitroglycerin, Tylenol [acetaminophen], and Oxycodone   Social History   Socioeconomic History  . Marital status: Widowed    Spouse name: Not on file  . Number of children: Not on file  . Years of education: Not on file  . Highest education level: Not on file  Occupational History  . Not on file  Tobacco Use  . Smoking status: Current Every Day Smoker    Packs/day: 0.50    Types: Cigarettes  . Smokeless tobacco: Never Used  Substance and Sexual Activity  . Alcohol use: No  . Drug use: No  . Sexual activity: Never  Other Topics Concern  . Not on file  Social History Narrative  . Not on file   Social Determinants of Health   Financial Resource Strain:   . Difficulty of Paying Living Expenses:   Food Insecurity:   . Worried About Charity fundraiser in the Last Year:     . Arboriculturist in the Last Year:   Transportation Needs:   . Film/video editor (Medical):   Marland Kitchen Lack of Transportation (Non-Medical):   Physical Activity:   . Days of Exercise per Week:   . Minutes of Exercise per Session:   Stress:   . Feeling of Stress :   Social Connections:   . Frequency of Communication with Friends and Family:   . Frequency of Social Gatherings with Friends and Family:   . Attends Religious Services:   . Active Member of Clubs or Organizations:   . Attends Archivist Meetings:   Marland Kitchen Marital Status:      Family History: The patient's family history includes CVA in her father; Cerebral aneurysm in her mother; Heart attack in her father and sister.  ROS:   Review of Systems  Constitution: Negative for decreased appetite, fever and weight gain.  HENT: Negative for congestion, ear discharge, hoarse voice and sore throat.   Eyes: Negative for discharge, redness, vision loss in right eye and visual halos.  Cardiovascular: Negative for chest pain, dyspnea on exertion, leg swelling, orthopnea and palpitations.  Respiratory: Negative for cough, hemoptysis, shortness of breath and snoring.   Endocrine: Negative for heat intolerance and polyphagia.  Hematologic/Lymphatic: Negative for bleeding problem. Does not bruise/bleed easily.  Skin: Negative for flushing, nail changes, rash and suspicious lesions.  Musculoskeletal: Negative for arthritis, joint pain, muscle cramps, myalgias, neck pain and stiffness.  Gastrointestinal: Negative for abdominal pain, bowel incontinence, diarrhea and excessive appetite.  Genitourinary: Negative for decreased libido, genital sores and incomplete emptying.  Neurological: Negative for brief paralysis, focal weakness, headaches and loss of balance.  Psychiatric/Behavioral: Negative for altered mental status, depression and suicidal ideas.  Allergic/Immunologic: Negative for HIV exposure and persistent infections.     EKGs/Labs/Other Studies Reviewed:    The following studies were reviewed today:   EKG:  The ekg ordered today demonstrates   Transthoracic echocardiogram done in March 2021 showed normal left ventricular systolic function, EF 55 to 123456, grade 1 diastolic dysfunction.  Right ventricle is normal.  Left atrium is severely dilated.  Right atrium is mildly enlarged.  Aortic valve is trileaflet and normal.  Mild aortic valve sclerosis.  Mild mitral regurgitation.  Mitral cusp regurgitation.  Trace pulmonic regurgitation.  Aortic root, ascending aorta and aortic arch appear to be normal.  There is a small circumferential pericardial effusion.  Recent Labs: 01/26/2019: Magnesium 1.8 06/05/2019: BUN 11; Creatinine, Ser 0.53; Potassium 4.3; Sodium 141  Recent Lipid Panel No results found for: CHOL, TRIG, HDL, CHOLHDL, VLDL, LDLCALC, LDLDIRECT  Physical Exam:    VS:  BP (!) 96/50 (BP Location: Right Arm, Patient Position: Sitting, Cuff Size: Normal)   Pulse 62   Ht 5\' 2"  (1.575 m)   Wt 135 lb (61.2 kg)   SpO2 94%   BMI 24.69 kg/m     Wt Readings from Last 3 Encounters:  09/18/19 135 lb (61.2 kg)  07/31/19 129 lb (58.5 kg)  07/13/19 130 lb (59 kg)     GEN: Well nourished, well developed in no acute distress HEENT: Normal NECK: No JVD; No carotid bruits LYMPHATICS: No lymphadenopathy CARDIAC: S1S2 noted,RRR, no murmurs, rubs, gallops RESPIRATORY:  Clear to auscultation without rales, wheezing or rhonchi  ABDOMEN: Soft, non-tender, non-distended, +bowel sounds, no guarding. EXTREMITIES: No edema, No cyanosis, no clubbing MUSCULOSKELETAL:  No deformity  SKIN: Warm and dry NEUROLOGIC:  Alert and oriented x 3, non-focal PSYCHIATRIC:  Normal affect, good insight  ASSESSMENT:    1. Dizziness   2. Essential hypertension   3. Mixed hyperlipidemia    PLAN:    1.  Dizziness-go to stop the amlodipine because I do suspect this is contributing to her dizziness.  She also has been  started on meclizine we will continue this medication for now.  She needs ENT evaluation as well as she had been treated for vertigo in the past.  2.  Her blood pressure is on the lower side today.  She has a history of orthostatic hypotension thankfully today she is not set up for orthostatic.  I still would like to see the patient sooner in the next 4 weeks to see if her symptoms have improved off of the amlodipine as well as decreasing her hydralazine.  If this remains she would be a good candidate to start midodrine.  23.  Thankfully no chest pain today the increasing Ranexa at her last visit has helped her symptoms tremendously.    The patient is in agreement with the above plan. The patient left the office in stable condition.  The patient will follow up in   Medication Adjustments/Labs and Tests Ordered: Current medicines are reviewed at length with the patient today.  Concerns regarding medicines are outlined above.  No orders of the defined types were placed in this encounter.  Meds ordered this encounter  Medications  . hydrALAZINE (APRESOLINE) 25 MG tablet    Sig: Take 1 tablet (25 mg total) by mouth 2 (two) times daily.    Dispense:  270 tablet    Refill:  3    Patient Instructions  Medication Instructions:  Your physician has recommended you make the following change in your medication: 1. STOP: AMLODIPINE   2. DECREASE: HYDRALAZINE TO 25 MG TWICE DAILY  *If you need a refill on your cardiac medications before your next appointment, please call your pharmacy*   Lab Work: NONE  If you have labs (blood work) drawn today and your tests are completely normal, you will receive your results only by: Marland Kitchen MyChart Message (if you have MyChart) OR . A paper  copy in the mail If you have any lab test that is abnormal or we need to change your treatment, we will call you to review the results.  Testing/Procedures: NONE   Follow-Up: At [facility], you and your health  needs are our priority.  As part of our continuing mission to provide you with exceptional heart care, we have created designated Provider Care Teams.  These Care Teams include your primary Cardiologist (physician) and Advanced Practice Providers (APPs -  Physician Assistants and Nurse Practitioners) who all work together to provide you with the care you need, when you need it.  We recommend signing up for the patient portal called "MyChart".  Sign up information is provided on this After Visit Summary.  MyChart is used to connect with patients for Virtual Visits (Telemedicine).  Patients are able to view lab/test results, encounter notes, upcoming appointments, etc.  Non-urgent messages can be sent to your provider as well.   To learn more about what you can do with MyChart, go to [email or web address].    Your next appointment:   4 week(s)  The format for your next appointment:   In Person  Provider:   DR. Harriet Masson       Adopting a Healthy Lifestyle.  Know what a healthy weight is for you (roughly BMI <25) and aim to maintain this   Aim for 7+ servings of fruits and vegetables daily   65-80+ fluid ounces of water or unsweet tea for healthy kidneys   Limit to max 1 drink of alcohol per day; avoid smoking/tobacco   Limit animal fats in diet for cholesterol and heart health - choose grass fed whenever available   Avoid highly processed foods, and foods high in saturated/trans fats   Aim for low stress - take time to unwind and care for your mental health   Aim for 150 min of moderate intensity exercise weekly for heart health, and weights twice weekly for bone health   Aim for 7-9 hours of sleep daily   When it comes to diets, agreement about the perfect plan isnt easy to find, even among the experts. Experts at the [employer] developed an idea known as the Healthy Eating Plate. Just imagine a plate divided into logical, healthy portions.   The emphasis is  on diet quality:   Load up on vegetables and fruits - one-half of your plate: Aim for color and variety, and remember that potatoes dont count.   Go for whole grains - one-quarter of your plate: Whole wheat, barley, wheat berries, quinoa, oats, brown rice, and foods made with them. If you want pasta, go with whole wheat pasta.   Protein power - one-quarter of your plate: Fish, chicken, beans, and nuts are all healthy, versatile protein sources. Limit red meat.   The diet, however, does go beyond the plate, offering a few other suggestions.   Use healthy plant oils, such as olive, canola, soy, corn, sunflower and peanut. Check the labels, and avoid partially hydrogenated oil, which have unhealthy trans fats.   If youre thirsty, drink water. Coffee and tea are good in moderation, but skip sugary drinks and limit milk and dairy products to one or two daily servings.   The type of carbohydrate in the diet is more important than the amount. Some sources of carbohydrates, such as vegetables, fruits, whole grains, and beans-are healthier than others.   Finally, stay active  Signed, Berniece Salines, DO  09/18/2019 12:28 PM  [facility]

## 2019-09-19 DIAGNOSIS — G510 Bell's palsy: Secondary | ICD-10-CM | POA: Diagnosis not present

## 2019-09-19 DIAGNOSIS — I700 Atherosclerosis of aorta: Secondary | ICD-10-CM | POA: Diagnosis not present

## 2019-09-19 DIAGNOSIS — M19012 Primary osteoarthritis, left shoulder: Secondary | ICD-10-CM | POA: Diagnosis not present

## 2019-09-19 DIAGNOSIS — D649 Anemia, unspecified: Secondary | ICD-10-CM | POA: Diagnosis not present

## 2019-09-19 DIAGNOSIS — J441 Chronic obstructive pulmonary disease with (acute) exacerbation: Secondary | ICD-10-CM | POA: Diagnosis not present

## 2019-09-19 DIAGNOSIS — R42 Dizziness and giddiness: Secondary | ICD-10-CM | POA: Diagnosis not present

## 2019-09-19 DIAGNOSIS — Z9181 History of falling: Secondary | ICD-10-CM | POA: Diagnosis not present

## 2019-09-19 DIAGNOSIS — H547 Unspecified visual loss: Secondary | ICD-10-CM | POA: Diagnosis not present

## 2019-09-19 DIAGNOSIS — M179 Osteoarthritis of knee, unspecified: Secondary | ICD-10-CM | POA: Diagnosis not present

## 2019-09-19 DIAGNOSIS — Z79891 Long term (current) use of opiate analgesic: Secondary | ICD-10-CM | POA: Diagnosis not present

## 2019-09-19 DIAGNOSIS — G43909 Migraine, unspecified, not intractable, without status migrainosus: Secondary | ICD-10-CM | POA: Diagnosis not present

## 2019-09-19 DIAGNOSIS — M5030 Other cervical disc degeneration, unspecified cervical region: Secondary | ICD-10-CM | POA: Diagnosis not present

## 2019-09-19 DIAGNOSIS — Z7982 Long term (current) use of aspirin: Secondary | ICD-10-CM | POA: Diagnosis not present

## 2019-09-19 DIAGNOSIS — F17210 Nicotine dependence, cigarettes, uncomplicated: Secondary | ICD-10-CM | POA: Diagnosis not present

## 2019-09-19 DIAGNOSIS — J181 Lobar pneumonia, unspecified organism: Secondary | ICD-10-CM | POA: Diagnosis not present

## 2019-09-19 DIAGNOSIS — E119 Type 2 diabetes mellitus without complications: Secondary | ICD-10-CM | POA: Diagnosis not present

## 2019-09-19 DIAGNOSIS — H8190 Unspecified disorder of vestibular function, unspecified ear: Secondary | ICD-10-CM | POA: Diagnosis not present

## 2019-09-19 DIAGNOSIS — Z85038 Personal history of other malignant neoplasm of large intestine: Secondary | ICD-10-CM | POA: Diagnosis not present

## 2019-09-19 DIAGNOSIS — I5032 Chronic diastolic (congestive) heart failure: Secondary | ICD-10-CM | POA: Diagnosis not present

## 2019-09-19 DIAGNOSIS — J440 Chronic obstructive pulmonary disease with acute lower respiratory infection: Secondary | ICD-10-CM | POA: Diagnosis not present

## 2019-09-19 DIAGNOSIS — E785 Hyperlipidemia, unspecified: Secondary | ICD-10-CM | POA: Diagnosis not present

## 2019-09-19 DIAGNOSIS — I110 Hypertensive heart disease with heart failure: Secondary | ICD-10-CM | POA: Diagnosis not present

## 2019-09-19 DIAGNOSIS — I081 Rheumatic disorders of both mitral and tricuspid valves: Secondary | ICD-10-CM | POA: Diagnosis not present

## 2019-09-19 DIAGNOSIS — K219 Gastro-esophageal reflux disease without esophagitis: Secondary | ICD-10-CM | POA: Diagnosis not present

## 2019-09-20 DIAGNOSIS — K580 Irritable bowel syndrome with diarrhea: Secondary | ICD-10-CM | POA: Diagnosis not present

## 2019-09-20 DIAGNOSIS — D649 Anemia, unspecified: Secondary | ICD-10-CM | POA: Diagnosis not present

## 2019-09-20 DIAGNOSIS — E538 Deficiency of other specified B group vitamins: Secondary | ICD-10-CM | POA: Diagnosis not present

## 2019-09-20 DIAGNOSIS — R197 Diarrhea, unspecified: Secondary | ICD-10-CM | POA: Diagnosis not present

## 2019-09-22 DIAGNOSIS — J440 Chronic obstructive pulmonary disease with acute lower respiratory infection: Secondary | ICD-10-CM | POA: Diagnosis not present

## 2019-09-22 DIAGNOSIS — J181 Lobar pneumonia, unspecified organism: Secondary | ICD-10-CM | POA: Diagnosis not present

## 2019-09-22 DIAGNOSIS — J441 Chronic obstructive pulmonary disease with (acute) exacerbation: Secondary | ICD-10-CM | POA: Diagnosis not present

## 2019-09-26 DIAGNOSIS — G43909 Migraine, unspecified, not intractable, without status migrainosus: Secondary | ICD-10-CM | POA: Diagnosis not present

## 2019-09-26 DIAGNOSIS — M19012 Primary osteoarthritis, left shoulder: Secondary | ICD-10-CM | POA: Diagnosis not present

## 2019-09-26 DIAGNOSIS — I700 Atherosclerosis of aorta: Secondary | ICD-10-CM | POA: Diagnosis not present

## 2019-09-26 DIAGNOSIS — Z85038 Personal history of other malignant neoplasm of large intestine: Secondary | ICD-10-CM | POA: Diagnosis not present

## 2019-09-26 DIAGNOSIS — J181 Lobar pneumonia, unspecified organism: Secondary | ICD-10-CM | POA: Diagnosis not present

## 2019-09-26 DIAGNOSIS — J441 Chronic obstructive pulmonary disease with (acute) exacerbation: Secondary | ICD-10-CM | POA: Diagnosis not present

## 2019-09-26 DIAGNOSIS — E119 Type 2 diabetes mellitus without complications: Secondary | ICD-10-CM | POA: Diagnosis not present

## 2019-09-26 DIAGNOSIS — M179 Osteoarthritis of knee, unspecified: Secondary | ICD-10-CM | POA: Diagnosis not present

## 2019-09-26 DIAGNOSIS — Z7982 Long term (current) use of aspirin: Secondary | ICD-10-CM | POA: Diagnosis not present

## 2019-09-26 DIAGNOSIS — I110 Hypertensive heart disease with heart failure: Secondary | ICD-10-CM | POA: Diagnosis not present

## 2019-09-26 DIAGNOSIS — K219 Gastro-esophageal reflux disease without esophagitis: Secondary | ICD-10-CM | POA: Diagnosis not present

## 2019-09-26 DIAGNOSIS — H547 Unspecified visual loss: Secondary | ICD-10-CM | POA: Diagnosis not present

## 2019-09-26 DIAGNOSIS — Z79891 Long term (current) use of opiate analgesic: Secondary | ICD-10-CM | POA: Diagnosis not present

## 2019-09-26 DIAGNOSIS — G510 Bell's palsy: Secondary | ICD-10-CM | POA: Diagnosis not present

## 2019-09-26 DIAGNOSIS — I5032 Chronic diastolic (congestive) heart failure: Secondary | ICD-10-CM | POA: Diagnosis not present

## 2019-09-26 DIAGNOSIS — R42 Dizziness and giddiness: Secondary | ICD-10-CM | POA: Diagnosis not present

## 2019-09-26 DIAGNOSIS — I081 Rheumatic disorders of both mitral and tricuspid valves: Secondary | ICD-10-CM | POA: Diagnosis not present

## 2019-09-26 DIAGNOSIS — D649 Anemia, unspecified: Secondary | ICD-10-CM | POA: Diagnosis not present

## 2019-09-26 DIAGNOSIS — J440 Chronic obstructive pulmonary disease with acute lower respiratory infection: Secondary | ICD-10-CM | POA: Diagnosis not present

## 2019-09-26 DIAGNOSIS — M5030 Other cervical disc degeneration, unspecified cervical region: Secondary | ICD-10-CM | POA: Diagnosis not present

## 2019-09-26 DIAGNOSIS — F17210 Nicotine dependence, cigarettes, uncomplicated: Secondary | ICD-10-CM | POA: Diagnosis not present

## 2019-09-26 DIAGNOSIS — E785 Hyperlipidemia, unspecified: Secondary | ICD-10-CM | POA: Diagnosis not present

## 2019-09-26 DIAGNOSIS — Z9181 History of falling: Secondary | ICD-10-CM | POA: Diagnosis not present

## 2019-09-26 DIAGNOSIS — H8190 Unspecified disorder of vestibular function, unspecified ear: Secondary | ICD-10-CM | POA: Diagnosis not present

## 2019-10-03 DIAGNOSIS — F17210 Nicotine dependence, cigarettes, uncomplicated: Secondary | ICD-10-CM | POA: Diagnosis not present

## 2019-10-03 DIAGNOSIS — Z9181 History of falling: Secondary | ICD-10-CM | POA: Diagnosis not present

## 2019-10-03 DIAGNOSIS — E785 Hyperlipidemia, unspecified: Secondary | ICD-10-CM | POA: Diagnosis not present

## 2019-10-03 DIAGNOSIS — M179 Osteoarthritis of knee, unspecified: Secondary | ICD-10-CM | POA: Diagnosis not present

## 2019-10-03 DIAGNOSIS — J441 Chronic obstructive pulmonary disease with (acute) exacerbation: Secondary | ICD-10-CM | POA: Diagnosis not present

## 2019-10-03 DIAGNOSIS — R42 Dizziness and giddiness: Secondary | ICD-10-CM | POA: Diagnosis not present

## 2019-10-03 DIAGNOSIS — Z85038 Personal history of other malignant neoplasm of large intestine: Secondary | ICD-10-CM | POA: Diagnosis not present

## 2019-10-03 DIAGNOSIS — M5030 Other cervical disc degeneration, unspecified cervical region: Secondary | ICD-10-CM | POA: Diagnosis not present

## 2019-10-03 DIAGNOSIS — E119 Type 2 diabetes mellitus without complications: Secondary | ICD-10-CM | POA: Diagnosis not present

## 2019-10-03 DIAGNOSIS — D649 Anemia, unspecified: Secondary | ICD-10-CM | POA: Diagnosis not present

## 2019-10-03 DIAGNOSIS — H8190 Unspecified disorder of vestibular function, unspecified ear: Secondary | ICD-10-CM | POA: Diagnosis not present

## 2019-10-03 DIAGNOSIS — I700 Atherosclerosis of aorta: Secondary | ICD-10-CM | POA: Diagnosis not present

## 2019-10-03 DIAGNOSIS — I110 Hypertensive heart disease with heart failure: Secondary | ICD-10-CM | POA: Diagnosis not present

## 2019-10-03 DIAGNOSIS — Z7982 Long term (current) use of aspirin: Secondary | ICD-10-CM | POA: Diagnosis not present

## 2019-10-03 DIAGNOSIS — M19012 Primary osteoarthritis, left shoulder: Secondary | ICD-10-CM | POA: Diagnosis not present

## 2019-10-03 DIAGNOSIS — H547 Unspecified visual loss: Secondary | ICD-10-CM | POA: Diagnosis not present

## 2019-10-03 DIAGNOSIS — J440 Chronic obstructive pulmonary disease with acute lower respiratory infection: Secondary | ICD-10-CM | POA: Diagnosis not present

## 2019-10-03 DIAGNOSIS — K219 Gastro-esophageal reflux disease without esophagitis: Secondary | ICD-10-CM | POA: Diagnosis not present

## 2019-10-03 DIAGNOSIS — I5032 Chronic diastolic (congestive) heart failure: Secondary | ICD-10-CM | POA: Diagnosis not present

## 2019-10-03 DIAGNOSIS — G43909 Migraine, unspecified, not intractable, without status migrainosus: Secondary | ICD-10-CM | POA: Diagnosis not present

## 2019-10-03 DIAGNOSIS — G510 Bell's palsy: Secondary | ICD-10-CM | POA: Diagnosis not present

## 2019-10-03 DIAGNOSIS — I081 Rheumatic disorders of both mitral and tricuspid valves: Secondary | ICD-10-CM | POA: Diagnosis not present

## 2019-10-03 DIAGNOSIS — Z79891 Long term (current) use of opiate analgesic: Secondary | ICD-10-CM | POA: Diagnosis not present

## 2019-10-03 DIAGNOSIS — J181 Lobar pneumonia, unspecified organism: Secondary | ICD-10-CM | POA: Diagnosis not present

## 2019-10-05 DIAGNOSIS — H02055 Trichiasis without entropian left lower eyelid: Secondary | ICD-10-CM | POA: Diagnosis not present

## 2019-10-05 DIAGNOSIS — Z961 Presence of intraocular lens: Secondary | ICD-10-CM | POA: Diagnosis not present

## 2019-10-09 DIAGNOSIS — Z9181 History of falling: Secondary | ICD-10-CM | POA: Diagnosis not present

## 2019-10-09 DIAGNOSIS — F17210 Nicotine dependence, cigarettes, uncomplicated: Secondary | ICD-10-CM | POA: Diagnosis not present

## 2019-10-09 DIAGNOSIS — G510 Bell's palsy: Secondary | ICD-10-CM | POA: Diagnosis not present

## 2019-10-09 DIAGNOSIS — H547 Unspecified visual loss: Secondary | ICD-10-CM | POA: Diagnosis not present

## 2019-10-09 DIAGNOSIS — Z7982 Long term (current) use of aspirin: Secondary | ICD-10-CM | POA: Diagnosis not present

## 2019-10-09 DIAGNOSIS — G43909 Migraine, unspecified, not intractable, without status migrainosus: Secondary | ICD-10-CM | POA: Diagnosis not present

## 2019-10-09 DIAGNOSIS — M179 Osteoarthritis of knee, unspecified: Secondary | ICD-10-CM | POA: Diagnosis not present

## 2019-10-09 DIAGNOSIS — M5030 Other cervical disc degeneration, unspecified cervical region: Secondary | ICD-10-CM | POA: Diagnosis not present

## 2019-10-09 DIAGNOSIS — I081 Rheumatic disorders of both mitral and tricuspid valves: Secondary | ICD-10-CM | POA: Diagnosis not present

## 2019-10-09 DIAGNOSIS — E785 Hyperlipidemia, unspecified: Secondary | ICD-10-CM | POA: Diagnosis not present

## 2019-10-09 DIAGNOSIS — M19012 Primary osteoarthritis, left shoulder: Secondary | ICD-10-CM | POA: Diagnosis not present

## 2019-10-09 DIAGNOSIS — H8190 Unspecified disorder of vestibular function, unspecified ear: Secondary | ICD-10-CM | POA: Diagnosis not present

## 2019-10-09 DIAGNOSIS — J181 Lobar pneumonia, unspecified organism: Secondary | ICD-10-CM | POA: Diagnosis not present

## 2019-10-09 DIAGNOSIS — Z79891 Long term (current) use of opiate analgesic: Secondary | ICD-10-CM | POA: Diagnosis not present

## 2019-10-09 DIAGNOSIS — Z85038 Personal history of other malignant neoplasm of large intestine: Secondary | ICD-10-CM | POA: Diagnosis not present

## 2019-10-09 DIAGNOSIS — J441 Chronic obstructive pulmonary disease with (acute) exacerbation: Secondary | ICD-10-CM | POA: Diagnosis not present

## 2019-10-09 DIAGNOSIS — K219 Gastro-esophageal reflux disease without esophagitis: Secondary | ICD-10-CM | POA: Diagnosis not present

## 2019-10-09 DIAGNOSIS — I700 Atherosclerosis of aorta: Secondary | ICD-10-CM | POA: Diagnosis not present

## 2019-10-09 DIAGNOSIS — J440 Chronic obstructive pulmonary disease with acute lower respiratory infection: Secondary | ICD-10-CM | POA: Diagnosis not present

## 2019-10-09 DIAGNOSIS — E119 Type 2 diabetes mellitus without complications: Secondary | ICD-10-CM | POA: Diagnosis not present

## 2019-10-09 DIAGNOSIS — I110 Hypertensive heart disease with heart failure: Secondary | ICD-10-CM | POA: Diagnosis not present

## 2019-10-09 DIAGNOSIS — I5032 Chronic diastolic (congestive) heart failure: Secondary | ICD-10-CM | POA: Diagnosis not present

## 2019-10-09 DIAGNOSIS — D649 Anemia, unspecified: Secondary | ICD-10-CM | POA: Diagnosis not present

## 2019-10-09 DIAGNOSIS — R42 Dizziness and giddiness: Secondary | ICD-10-CM | POA: Diagnosis not present

## 2019-10-11 DIAGNOSIS — J441 Chronic obstructive pulmonary disease with (acute) exacerbation: Secondary | ICD-10-CM | POA: Diagnosis not present

## 2019-10-11 DIAGNOSIS — J181 Lobar pneumonia, unspecified organism: Secondary | ICD-10-CM | POA: Diagnosis not present

## 2019-10-11 DIAGNOSIS — J440 Chronic obstructive pulmonary disease with acute lower respiratory infection: Secondary | ICD-10-CM | POA: Diagnosis not present

## 2019-10-11 DIAGNOSIS — G510 Bell's palsy: Secondary | ICD-10-CM | POA: Diagnosis not present

## 2019-10-11 DIAGNOSIS — R42 Dizziness and giddiness: Secondary | ICD-10-CM | POA: Diagnosis not present

## 2019-10-12 DIAGNOSIS — R197 Diarrhea, unspecified: Secondary | ICD-10-CM | POA: Diagnosis not present

## 2019-10-16 DIAGNOSIS — Z9181 History of falling: Secondary | ICD-10-CM | POA: Diagnosis not present

## 2019-10-16 DIAGNOSIS — J440 Chronic obstructive pulmonary disease with acute lower respiratory infection: Secondary | ICD-10-CM | POA: Diagnosis not present

## 2019-10-16 DIAGNOSIS — H547 Unspecified visual loss: Secondary | ICD-10-CM | POA: Diagnosis not present

## 2019-10-16 DIAGNOSIS — F17210 Nicotine dependence, cigarettes, uncomplicated: Secondary | ICD-10-CM | POA: Diagnosis not present

## 2019-10-16 DIAGNOSIS — M179 Osteoarthritis of knee, unspecified: Secondary | ICD-10-CM | POA: Diagnosis not present

## 2019-10-16 DIAGNOSIS — E785 Hyperlipidemia, unspecified: Secondary | ICD-10-CM | POA: Diagnosis not present

## 2019-10-16 DIAGNOSIS — G510 Bell's palsy: Secondary | ICD-10-CM | POA: Diagnosis not present

## 2019-10-16 DIAGNOSIS — J181 Lobar pneumonia, unspecified organism: Secondary | ICD-10-CM | POA: Diagnosis not present

## 2019-10-16 DIAGNOSIS — D649 Anemia, unspecified: Secondary | ICD-10-CM | POA: Diagnosis not present

## 2019-10-16 DIAGNOSIS — K219 Gastro-esophageal reflux disease without esophagitis: Secondary | ICD-10-CM | POA: Diagnosis not present

## 2019-10-16 DIAGNOSIS — M19012 Primary osteoarthritis, left shoulder: Secondary | ICD-10-CM | POA: Diagnosis not present

## 2019-10-16 DIAGNOSIS — I5032 Chronic diastolic (congestive) heart failure: Secondary | ICD-10-CM | POA: Diagnosis not present

## 2019-10-16 DIAGNOSIS — Z7982 Long term (current) use of aspirin: Secondary | ICD-10-CM | POA: Diagnosis not present

## 2019-10-16 DIAGNOSIS — I700 Atherosclerosis of aorta: Secondary | ICD-10-CM | POA: Diagnosis not present

## 2019-10-16 DIAGNOSIS — Z79891 Long term (current) use of opiate analgesic: Secondary | ICD-10-CM | POA: Diagnosis not present

## 2019-10-16 DIAGNOSIS — R42 Dizziness and giddiness: Secondary | ICD-10-CM | POA: Diagnosis not present

## 2019-10-16 DIAGNOSIS — I081 Rheumatic disorders of both mitral and tricuspid valves: Secondary | ICD-10-CM | POA: Diagnosis not present

## 2019-10-16 DIAGNOSIS — M5030 Other cervical disc degeneration, unspecified cervical region: Secondary | ICD-10-CM | POA: Diagnosis not present

## 2019-10-16 DIAGNOSIS — J441 Chronic obstructive pulmonary disease with (acute) exacerbation: Secondary | ICD-10-CM | POA: Diagnosis not present

## 2019-10-16 DIAGNOSIS — I110 Hypertensive heart disease with heart failure: Secondary | ICD-10-CM | POA: Diagnosis not present

## 2019-10-16 DIAGNOSIS — Z85038 Personal history of other malignant neoplasm of large intestine: Secondary | ICD-10-CM | POA: Diagnosis not present

## 2019-10-16 DIAGNOSIS — E119 Type 2 diabetes mellitus without complications: Secondary | ICD-10-CM | POA: Diagnosis not present

## 2019-10-16 DIAGNOSIS — H8190 Unspecified disorder of vestibular function, unspecified ear: Secondary | ICD-10-CM | POA: Diagnosis not present

## 2019-10-16 DIAGNOSIS — G43909 Migraine, unspecified, not intractable, without status migrainosus: Secondary | ICD-10-CM | POA: Diagnosis not present

## 2019-10-17 ENCOUNTER — Ambulatory Visit (INDEPENDENT_AMBULATORY_CARE_PROVIDER_SITE_OTHER): Payer: Medicare Other | Admitting: Cardiology

## 2019-10-17 ENCOUNTER — Other Ambulatory Visit: Payer: Self-pay

## 2019-10-17 VITALS — BP 108/62 | HR 83 | Ht 62.0 in | Wt 135.0 lb

## 2019-10-17 DIAGNOSIS — I471 Supraventricular tachycardia: Secondary | ICD-10-CM

## 2019-10-17 DIAGNOSIS — I25118 Atherosclerotic heart disease of native coronary artery with other forms of angina pectoris: Secondary | ICD-10-CM

## 2019-10-17 DIAGNOSIS — I10 Essential (primary) hypertension: Secondary | ICD-10-CM | POA: Diagnosis not present

## 2019-10-17 DIAGNOSIS — E782 Mixed hyperlipidemia: Secondary | ICD-10-CM | POA: Diagnosis not present

## 2019-10-17 NOTE — Patient Instructions (Signed)

## 2019-10-17 NOTE — Progress Notes (Signed)
Cardiology Office Note:    Date:  10/17/2019   ID:  Heather Mccoy, DOB [birth year], MRN [id number]  PCP:  Ernestene Kiel, MD  Cardiologist:  Berniece Salines, DO  Electrophysiologist:  None   Referring MD: Ernestene Kiel, MD   " I feel better"  History of Present Illness:    Heather Mccoy a 74 y.o.femalewith a hx of diabetes mellitus, colon cancer status post surgery, chronic diarrhea, anxiety, COPD not on oxygen, tobacco use, hypertension, CAD seen on Coronary CTAand hyperlipidemiapresents for follow-up visit.  I initially saw the patient onSeptember 4, 2020 at which time she was posthospitalization. She was hospitalized at [facility] in August where she was treated for syncope due to orthostatic hypotension. Her medications were optimized which included stopping her diuretics and she was started on midodrine and Florinef.On completion of our visit given her elevated blood pressure I did advise patient to only take her midodrine 5 mg 3 times daily if her systolic blood pressure was less than 120. Since her visit she has not needed to take the midodrine. She was also given a ZIO patch which she is still wearing to complete a 14-day monitoring. She was continued on the fludrocortisone 0.1 mg daily  The patient was seenon 02/05/2019.During that visit she was hypertensive and was still onfludrocortisone 0.1 mg daily. Started to titrate off the fludrocortisone. In addition the patient at the time did not want to start any antihypertensive medication. She was still wearing her Zio patch during the time of her visit.At her last visit October 14,2020 patient did have evidence of paroxysmal atrial tachycardiashe was started on carvedilol 6.25 mg twice daily.  He was seen on April 02, 2019 at which time she reported intermittent chest pain she described as left-sided burning sensation which radiated to her left arm and also caused left arm numbness.  During her visit we discussed getting a CTA coronaries.However at the conclusion of her visit the patient was being wanted checked out she did have an episode where she felt significantly lightheaded and was guided to the floor without falling or passing out.The patient was taken to [facility] at that time thought to be vasovagal syncope.   On April 16, 2019 I saw the patient in follow-up visit and she is still having some chest pain but tells me it did respond to her Imdur, and herCCTA was scheduled.   I did see the patient on 07/13/19 to discuss her CCTA results which showed evidence of coronay artery disease. She was started on renaxa 500mg  BID. She was also hypertensive, thereforeI increased her carvedilol to 12.5 mg twice daily.  [facility] droop and weakness. She was diagnosed with bells palsy. She has been experiencing worsening diarrhea - she does have chronic diarrhea but she notes that this has gotten worse.   At her visit on July 31, 2019 at that time increase her Ranexa to 1000 mg twice a day.  In the interim she was admitted to the [facility] for pneumonia and was placed on antibiotic for a while.    I saw the patient in  She was started on amlodipine the patient tells me that since that time she has been dizzy. We stopped the amlodipine at that time. She reports that she has felt better since her visit.  Past Medical History:  Diagnosis Date  . Anxiety   . Arthritis   . Bell's palsy 07/31/2019  .  Cancer ([place])    colon  . Chronic diarrhea of unknown origin 01/12/2012  . COPD (chronic obstructive pulmonary disease) ([place])   . Coronary artery disease of native artery of native heart with stable angina pectoris ([place]) 06/11/2019  . Diabetes mellitus   . Dysrhythmia   . Essential hypertension 06/11/2019  . Hyperlipemia 01/19/2019  . Hyperlipidemia   . Hypertension   . Mixed hyperlipidemia  06/11/2019  . Mood disorder ([place])   . Normocytic anemia 09/12/2014  . Orthostatic hypotension   . Paroxysmal atrial tachycardia ([place]) 06/11/2019  . Shortness of breath   . Type II diabetes mellitus ([place]) 01/19/2019  . Vitamin B12 deficiency 09/13/2014  . Weight loss 09/12/2014    Past Surgical History:  Procedure Laterality Date  . APPENDECTOMY    . BACK SURGERY    . CHOLECYSTECTOMY    . COLON SURGERY    . EUS  01/12/2012   Procedure: UPPER ENDOSCOPIC ULTRASOUND (EUS) RADIAL;  Surgeon: Arta Silence, MD;  Location: [facility] ENDOSCOPY;  Service: Endoscopy;  Laterality: N/A;  to be admitted after  . HAND SURGERY    . REPLACEMENT TOTAL KNEE      Current Medications: No outpatient medications have been marked as taking for the 10/17/19 encounter (Appointment) with Berniece Salines, DO.     Allergies:   Codeine, Morphine, Nitroglycerin, Tylenol [acetaminophen], and Oxycodone   Social History   Socioeconomic History  . Marital status: Widowed    Spouse name: Not on file  . Number of children: Not on file  . Years of education: Not on file  . Highest education level: Not on file  Occupational History  . Not on file  Tobacco Use  . Smoking status: Current Every Day Smoker    Packs/day: 0.50    Types: Cigarettes  . Smokeless tobacco: Never Used  Substance and Sexual Activity  . Alcohol use: No  . Drug use: No  . Sexual activity: Never  Other Topics Concern  . Not on file  Social History Narrative  . Not on file   Social Determinants of Health   Financial Resource Strain:   . Difficulty of Paying Living Expenses:   Food Insecurity:   . Worried About Charity fundraiser in the Last Year:   . Arboriculturist in the Last Year:   Transportation Needs:   . Film/video editor (Medical):   Marland Kitchen Lack of Transportation (Non-Medical):   Physical Activity:   . Days of Exercise per Week:   . Minutes of Exercise per Session:   Stress:   . Feeling of Stress :   Social Connections:   .  Frequency of Communication with Friends and Family:   . Frequency of Social Gatherings with Friends and Family:   . Attends Religious Services:   . Active Member of Clubs or Organizations:   . Attends Archivist Meetings:   Marland Kitchen Marital Status:      Family History: The patient's family history includes CVA in her father; Cerebral aneurysm in her mother; Heart attack in her father and sister.  ROS:   Review of Systems  Constitution: Negative for decreased appetite, fever and weight gain.  HENT: Negative for congestion, ear discharge, hoarse voice and sore throat.   Eyes: Negative for discharge, redness, vision loss in right eye and visual halos.  Cardiovascular: Negative for chest pain, dyspnea on exertion, leg swelling, orthopnea and palpitations.  Respiratory: Negative for cough, hemoptysis, shortness of breath and snoring.  Endocrine: Negative for heat intolerance and polyphagia.  Hematologic/Lymphatic: Negative for bleeding problem. Does not bruise/bleed easily.  Skin: Negative for flushing, nail changes, rash and suspicious lesions.  Musculoskeletal: Negative for arthritis, joint pain, muscle cramps, myalgias, neck pain and stiffness.  Gastrointestinal: Negative for abdominal pain, bowel incontinence, diarrhea and excessive appetite.  Genitourinary: Negative for decreased libido, genital sores and incomplete emptying.  Neurological: Negative for brief paralysis, focal weakness, headaches and loss of balance.  Psychiatric/Behavioral: Negative for altered mental status, depression and suicidal ideas.  Allergic/Immunologic: Negative for HIV exposure and persistent infections.    EKGs/Labs/Other Studies Reviewed:    The following studies were reviewed today:   EKG:  None today  CCTA  IMPRESSION: 1. Coronary calcium score of 345. This was 106 percentile for age and sex matched control.  2. Normal coronary origin with right dominance.  3. Moderate Non-obstructive  coronary artery disease. CAD-RADS 3. This study will sent for FFR.  Transthoracic echocardiogram done in March 2021 showed normal left ventricular systolic function, EF 55 to 123456, grade 1 diastolic dysfunction. Right ventricle is normal. Left atrium is severely dilated. Right atrium is mildly enlarged. Aortic valve is trileaflet and normal. Mild aortic valve sclerosis. Mild mitral regurgitation. Mitral cusp regurgitation. Trace pulmonic regurgitation. Aortic root, ascending aorta and aortic arch appear to be normal. There is a small circumferential pericardial effusion.  Recent Labs: 01/26/2019: Magnesium 1.8 06/05/2019: BUN 11; Creatinine, Ser 0.53; Potassium 4.3; Sodium 141  Recent Lipid Panel No results found for: CHOL, TRIG, HDL, CHOLHDL, VLDL, LDLCALC, LDLDIRECT  Physical Exam:    VS:  There were no vitals taken for this visit.    Wt Readings from Last 3 Encounters:  09/18/19 135 lb (61.2 kg)  07/31/19 129 lb (58.5 kg)  07/13/19 130 lb (59 kg)     GEN: Well nourished, well developed in no acute distress HEENT: Normal NECK: No JVD; No carotid bruits LYMPHATICS: No lymphadenopathy CARDIAC: S1S2 noted,RRR, no murmurs, rubs, gallops RESPIRATORY:  Clear to auscultation without rales, wheezing or rhonchi  ABDOMEN: Soft, non-tender, non-distended, +bowel sounds, no guarding. EXTREMITIES: No edema, No cyanosis, no clubbing MUSCULOSKELETAL:  No deformity  SKIN: Warm and dry NEUROLOGIC:  Alert and oriented x 3, non-focal PSYCHIATRIC:  Normal affect, good insight  ASSESSMENT:    1. Coronary artery disease of native artery of native heart with stable angina pectoris ([place])   2. Essential hypertension   3. Paroxysmal atrial tachycardia ([place])   4. Mixed hyperlipidemia    PLAN:    1. Her dizziness has improved significantly and still has a blood pressure. I will continue her on current medication regimen with no changes for hypertension.  2. Coronary disease no signs and  symptoms of angina. Continue patient on her aspirin as well as her atorvastatin 40 mg daily.  3. Paroxysmal atrial tachycardia, denies any palpitations continue patient on carvedilol 12.5 mg daily.  4. Hyperlipidemia continue patient on Lipitor 40 mg daily.  The patient is in agreement with the above plan. The patient left the office in stable condition.  The patient will follow up in 3 months or sooner if needed.   Medication Adjustments/Labs and Tests Ordered: Current medicines are reviewed at length with the patient today.  Concerns regarding medicines are outlined above.  No orders of the defined types were placed in this encounter.  No orders of the defined types were placed in this encounter.   There are no Patient Instructions on file for this visit.  Adopting a Healthy Lifestyle.  Know what a healthy weight is for you (roughly BMI <25) and aim to maintain this   Aim for 7+ servings of fruits and vegetables daily   65-80+ fluid ounces of water or unsweet tea for healthy kidneys   Limit to max 1 drink of alcohol per day; avoid smoking/tobacco   Limit animal fats in diet for cholesterol and heart health - choose grass fed whenever available   Avoid highly processed foods, and foods high in saturated/trans fats   Aim for low stress - take time to unwind and care for your mental health   Aim for 150 min of moderate intensity exercise weekly for heart health, and weights twice weekly for bone health   Aim for 7-9 hours of sleep daily   When it comes to diets, agreement about the perfect plan isnt easy to find, even among the experts. Experts at the [employer] developed an idea known as the Healthy Eating Plate. Just imagine a plate divided into logical, healthy portions.   The emphasis is on diet quality:   Load up on vegetables and fruits - one-half of your plate: Aim for color and variety, and remember that potatoes dont count.   Go for whole  grains - one-quarter of your plate: Whole wheat, barley, wheat berries, quinoa, oats, brown rice, and foods made with them. If you want pasta, go with whole wheat pasta.   Protein power - one-quarter of your plate: Fish, chicken, beans, and nuts are all healthy, versatile protein sources. Limit red meat.   The diet, however, does go beyond the plate, offering a few other suggestions.   Use healthy plant oils, such as olive, canola, soy, corn, sunflower and peanut. Check the labels, and avoid partially hydrogenated oil, which have unhealthy trans fats.   If youre thirsty, drink water. Coffee and tea are good in moderation, but skip sugary drinks and limit milk and dairy products to one or two daily servings.   The type of carbohydrate in the diet is more important than the amount. Some sources of carbohydrates, such as vegetables, fruits, whole grains, and beans-are healthier than others.   Finally, stay active  Signed, Berniece Salines, DO  10/17/2019 10:24 AM    [place]

## 2019-10-24 DIAGNOSIS — I700 Atherosclerosis of aorta: Secondary | ICD-10-CM | POA: Diagnosis not present

## 2019-10-24 DIAGNOSIS — M5030 Other cervical disc degeneration, unspecified cervical region: Secondary | ICD-10-CM | POA: Diagnosis not present

## 2019-10-24 DIAGNOSIS — M19012 Primary osteoarthritis, left shoulder: Secondary | ICD-10-CM | POA: Diagnosis not present

## 2019-10-24 DIAGNOSIS — H547 Unspecified visual loss: Secondary | ICD-10-CM | POA: Diagnosis not present

## 2019-10-24 DIAGNOSIS — G510 Bell's palsy: Secondary | ICD-10-CM | POA: Diagnosis not present

## 2019-10-24 DIAGNOSIS — M179 Osteoarthritis of knee, unspecified: Secondary | ICD-10-CM | POA: Diagnosis not present

## 2019-10-24 DIAGNOSIS — J181 Lobar pneumonia, unspecified organism: Secondary | ICD-10-CM | POA: Diagnosis not present

## 2019-10-24 DIAGNOSIS — Z79891 Long term (current) use of opiate analgesic: Secondary | ICD-10-CM | POA: Diagnosis not present

## 2019-10-24 DIAGNOSIS — Z85038 Personal history of other malignant neoplasm of large intestine: Secondary | ICD-10-CM | POA: Diagnosis not present

## 2019-10-24 DIAGNOSIS — E785 Hyperlipidemia, unspecified: Secondary | ICD-10-CM | POA: Diagnosis not present

## 2019-10-24 DIAGNOSIS — J440 Chronic obstructive pulmonary disease with acute lower respiratory infection: Secondary | ICD-10-CM | POA: Diagnosis not present

## 2019-10-24 DIAGNOSIS — I081 Rheumatic disorders of both mitral and tricuspid valves: Secondary | ICD-10-CM | POA: Diagnosis not present

## 2019-10-24 DIAGNOSIS — H8190 Unspecified disorder of vestibular function, unspecified ear: Secondary | ICD-10-CM | POA: Diagnosis not present

## 2019-10-24 DIAGNOSIS — I110 Hypertensive heart disease with heart failure: Secondary | ICD-10-CM | POA: Diagnosis not present

## 2019-10-24 DIAGNOSIS — E119 Type 2 diabetes mellitus without complications: Secondary | ICD-10-CM | POA: Diagnosis not present

## 2019-10-24 DIAGNOSIS — J441 Chronic obstructive pulmonary disease with (acute) exacerbation: Secondary | ICD-10-CM | POA: Diagnosis not present

## 2019-10-24 DIAGNOSIS — G43909 Migraine, unspecified, not intractable, without status migrainosus: Secondary | ICD-10-CM | POA: Diagnosis not present

## 2019-10-24 DIAGNOSIS — F17210 Nicotine dependence, cigarettes, uncomplicated: Secondary | ICD-10-CM | POA: Diagnosis not present

## 2019-10-24 DIAGNOSIS — K219 Gastro-esophageal reflux disease without esophagitis: Secondary | ICD-10-CM | POA: Diagnosis not present

## 2019-10-24 DIAGNOSIS — Z9181 History of falling: Secondary | ICD-10-CM | POA: Diagnosis not present

## 2019-10-24 DIAGNOSIS — R42 Dizziness and giddiness: Secondary | ICD-10-CM | POA: Diagnosis not present

## 2019-10-24 DIAGNOSIS — I5032 Chronic diastolic (congestive) heart failure: Secondary | ICD-10-CM | POA: Diagnosis not present

## 2019-10-24 DIAGNOSIS — Z7982 Long term (current) use of aspirin: Secondary | ICD-10-CM | POA: Diagnosis not present

## 2019-10-24 DIAGNOSIS — D649 Anemia, unspecified: Secondary | ICD-10-CM | POA: Diagnosis not present

## 2019-10-25 DIAGNOSIS — H35353 Cystoid macular degeneration, bilateral: Secondary | ICD-10-CM | POA: Diagnosis not present

## 2019-10-25 DIAGNOSIS — H35352 Cystoid macular degeneration, left eye: Secondary | ICD-10-CM | POA: Diagnosis not present

## 2019-11-01 DIAGNOSIS — I5032 Chronic diastolic (congestive) heart failure: Secondary | ICD-10-CM | POA: Diagnosis not present

## 2019-11-01 DIAGNOSIS — D500 Iron deficiency anemia secondary to blood loss (chronic): Secondary | ICD-10-CM | POA: Diagnosis not present

## 2019-11-01 DIAGNOSIS — H547 Unspecified visual loss: Secondary | ICD-10-CM | POA: Diagnosis not present

## 2019-11-01 DIAGNOSIS — M5030 Other cervical disc degeneration, unspecified cervical region: Secondary | ICD-10-CM | POA: Diagnosis not present

## 2019-11-01 DIAGNOSIS — I110 Hypertensive heart disease with heart failure: Secondary | ICD-10-CM | POA: Diagnosis not present

## 2019-11-01 DIAGNOSIS — F17210 Nicotine dependence, cigarettes, uncomplicated: Secondary | ICD-10-CM | POA: Diagnosis not present

## 2019-11-01 DIAGNOSIS — E785 Hyperlipidemia, unspecified: Secondary | ICD-10-CM | POA: Diagnosis not present

## 2019-11-01 DIAGNOSIS — Z9181 History of falling: Secondary | ICD-10-CM | POA: Diagnosis not present

## 2019-11-01 DIAGNOSIS — I700 Atherosclerosis of aorta: Secondary | ICD-10-CM | POA: Diagnosis not present

## 2019-11-01 DIAGNOSIS — Z7982 Long term (current) use of aspirin: Secondary | ICD-10-CM | POA: Diagnosis not present

## 2019-11-01 DIAGNOSIS — J440 Chronic obstructive pulmonary disease with acute lower respiratory infection: Secondary | ICD-10-CM | POA: Diagnosis not present

## 2019-11-01 DIAGNOSIS — H8190 Unspecified disorder of vestibular function, unspecified ear: Secondary | ICD-10-CM | POA: Diagnosis not present

## 2019-11-01 DIAGNOSIS — J441 Chronic obstructive pulmonary disease with (acute) exacerbation: Secondary | ICD-10-CM | POA: Diagnosis not present

## 2019-11-01 DIAGNOSIS — R42 Dizziness and giddiness: Secondary | ICD-10-CM | POA: Diagnosis not present

## 2019-11-01 DIAGNOSIS — G43909 Migraine, unspecified, not intractable, without status migrainosus: Secondary | ICD-10-CM | POA: Diagnosis not present

## 2019-11-01 DIAGNOSIS — M19012 Primary osteoarthritis, left shoulder: Secondary | ICD-10-CM | POA: Diagnosis not present

## 2019-11-01 DIAGNOSIS — E119 Type 2 diabetes mellitus without complications: Secondary | ICD-10-CM | POA: Diagnosis not present

## 2019-11-01 DIAGNOSIS — G510 Bell's palsy: Secondary | ICD-10-CM | POA: Diagnosis not present

## 2019-11-01 DIAGNOSIS — Z85038 Personal history of other malignant neoplasm of large intestine: Secondary | ICD-10-CM | POA: Diagnosis not present

## 2019-11-01 DIAGNOSIS — M179 Osteoarthritis of knee, unspecified: Secondary | ICD-10-CM | POA: Diagnosis not present

## 2019-11-01 DIAGNOSIS — K219 Gastro-esophageal reflux disease without esophagitis: Secondary | ICD-10-CM | POA: Diagnosis not present

## 2019-11-01 DIAGNOSIS — J181 Lobar pneumonia, unspecified organism: Secondary | ICD-10-CM | POA: Diagnosis not present

## 2019-11-01 DIAGNOSIS — I081 Rheumatic disorders of both mitral and tricuspid valves: Secondary | ICD-10-CM | POA: Diagnosis not present

## 2019-11-01 DIAGNOSIS — D649 Anemia, unspecified: Secondary | ICD-10-CM | POA: Diagnosis not present

## 2019-11-01 DIAGNOSIS — Z79891 Long term (current) use of opiate analgesic: Secondary | ICD-10-CM | POA: Diagnosis not present

## 2019-11-05 ENCOUNTER — Ambulatory Visit: Admitting: Podiatry

## 2019-11-07 DIAGNOSIS — H8190 Unspecified disorder of vestibular function, unspecified ear: Secondary | ICD-10-CM | POA: Diagnosis not present

## 2019-11-07 DIAGNOSIS — Z79891 Long term (current) use of opiate analgesic: Secondary | ICD-10-CM | POA: Diagnosis not present

## 2019-11-07 DIAGNOSIS — Z9181 History of falling: Secondary | ICD-10-CM | POA: Diagnosis not present

## 2019-11-07 DIAGNOSIS — G510 Bell's palsy: Secondary | ICD-10-CM | POA: Diagnosis not present

## 2019-11-07 DIAGNOSIS — I081 Rheumatic disorders of both mitral and tricuspid valves: Secondary | ICD-10-CM | POA: Diagnosis not present

## 2019-11-07 DIAGNOSIS — E785 Hyperlipidemia, unspecified: Secondary | ICD-10-CM | POA: Diagnosis not present

## 2019-11-07 DIAGNOSIS — M179 Osteoarthritis of knee, unspecified: Secondary | ICD-10-CM | POA: Diagnosis not present

## 2019-11-07 DIAGNOSIS — D649 Anemia, unspecified: Secondary | ICD-10-CM | POA: Diagnosis not present

## 2019-11-07 DIAGNOSIS — I700 Atherosclerosis of aorta: Secondary | ICD-10-CM | POA: Diagnosis not present

## 2019-11-07 DIAGNOSIS — K219 Gastro-esophageal reflux disease without esophagitis: Secondary | ICD-10-CM | POA: Diagnosis not present

## 2019-11-07 DIAGNOSIS — F17210 Nicotine dependence, cigarettes, uncomplicated: Secondary | ICD-10-CM | POA: Diagnosis not present

## 2019-11-07 DIAGNOSIS — R42 Dizziness and giddiness: Secondary | ICD-10-CM | POA: Diagnosis not present

## 2019-11-07 DIAGNOSIS — I5032 Chronic diastolic (congestive) heart failure: Secondary | ICD-10-CM | POA: Diagnosis not present

## 2019-11-07 DIAGNOSIS — G43909 Migraine, unspecified, not intractable, without status migrainosus: Secondary | ICD-10-CM | POA: Diagnosis not present

## 2019-11-07 DIAGNOSIS — E119 Type 2 diabetes mellitus without complications: Secondary | ICD-10-CM | POA: Diagnosis not present

## 2019-11-07 DIAGNOSIS — Z85038 Personal history of other malignant neoplasm of large intestine: Secondary | ICD-10-CM | POA: Diagnosis not present

## 2019-11-07 DIAGNOSIS — Z7982 Long term (current) use of aspirin: Secondary | ICD-10-CM | POA: Diagnosis not present

## 2019-11-07 DIAGNOSIS — J181 Lobar pneumonia, unspecified organism: Secondary | ICD-10-CM | POA: Diagnosis not present

## 2019-11-07 DIAGNOSIS — M5030 Other cervical disc degeneration, unspecified cervical region: Secondary | ICD-10-CM | POA: Diagnosis not present

## 2019-11-07 DIAGNOSIS — J441 Chronic obstructive pulmonary disease with (acute) exacerbation: Secondary | ICD-10-CM | POA: Diagnosis not present

## 2019-11-07 DIAGNOSIS — M19012 Primary osteoarthritis, left shoulder: Secondary | ICD-10-CM | POA: Diagnosis not present

## 2019-11-07 DIAGNOSIS — J440 Chronic obstructive pulmonary disease with acute lower respiratory infection: Secondary | ICD-10-CM | POA: Diagnosis not present

## 2019-11-07 DIAGNOSIS — H547 Unspecified visual loss: Secondary | ICD-10-CM | POA: Diagnosis not present

## 2019-11-07 DIAGNOSIS — I110 Hypertensive heart disease with heart failure: Secondary | ICD-10-CM | POA: Diagnosis not present

## 2019-11-08 DIAGNOSIS — D509 Iron deficiency anemia, unspecified: Secondary | ICD-10-CM | POA: Diagnosis not present

## 2019-11-08 DIAGNOSIS — R1084 Generalized abdominal pain: Secondary | ICD-10-CM | POA: Diagnosis not present

## 2019-11-08 DIAGNOSIS — K580 Irritable bowel syndrome with diarrhea: Secondary | ICD-10-CM | POA: Diagnosis not present

## 2019-11-12 ENCOUNTER — Other Ambulatory Visit: Payer: Self-pay

## 2019-11-12 ENCOUNTER — Ambulatory Visit (INDEPENDENT_AMBULATORY_CARE_PROVIDER_SITE_OTHER): Admitting: Podiatry

## 2019-11-12 DIAGNOSIS — M79676 Pain in unspecified toe(s): Secondary | ICD-10-CM | POA: Diagnosis not present

## 2019-11-12 DIAGNOSIS — L600 Ingrowing nail: Secondary | ICD-10-CM | POA: Diagnosis not present

## 2019-11-12 NOTE — Progress Notes (Signed)
  Subjective:  Patient ID: Heather Mccoy, female    DOB: [birth year],  MRN: [id number]  Chief Complaint  Patient presents with  . Nail Problem    BL hallux BL borders x 3 wks; 7/10 thorbbign pain -Lt>Rt -pt denies redness/swelling/drainage -pt states," looks infected." -Tx: peroxide   74 y.o. female presents with the above complaint. History confirmed with patient.   Objective:  Physical Exam: warm, good capillary refill, no trophic changes or ulcerative lesions, normal DP and PT pulses and normal sensory exam. Left Foot: deep incurvated nail border distal left medial hallux. Ingrowing nail 2nd toe Right Foot: Ingrowing nail right hallux lateral border. No signs of acute infection either great toe.   Assessment:   1. Ingrown nail   2. Pain around toenail    Plan:  Patient was evaluated and treated and all questions answered.  Ingrown Nail -Nails x3 gently debrided in slant back fashion to remove ingrown nail. Left hallux nail anesthetized with 3cc 50/50 Lidocaine 1% plain, marcaine 0.5% plain to achieve debridement.   No follow-ups on file.

## 2019-11-13 DIAGNOSIS — M5030 Other cervical disc degeneration, unspecified cervical region: Secondary | ICD-10-CM | POA: Diagnosis not present

## 2019-11-13 DIAGNOSIS — D649 Anemia, unspecified: Secondary | ICD-10-CM | POA: Diagnosis not present

## 2019-11-13 DIAGNOSIS — J181 Lobar pneumonia, unspecified organism: Secondary | ICD-10-CM | POA: Diagnosis not present

## 2019-11-13 DIAGNOSIS — F17210 Nicotine dependence, cigarettes, uncomplicated: Secondary | ICD-10-CM | POA: Diagnosis not present

## 2019-11-13 DIAGNOSIS — I5032 Chronic diastolic (congestive) heart failure: Secondary | ICD-10-CM | POA: Diagnosis not present

## 2019-11-13 DIAGNOSIS — M179 Osteoarthritis of knee, unspecified: Secondary | ICD-10-CM | POA: Diagnosis not present

## 2019-11-13 DIAGNOSIS — Z9181 History of falling: Secondary | ICD-10-CM | POA: Diagnosis not present

## 2019-11-13 DIAGNOSIS — H8190 Unspecified disorder of vestibular function, unspecified ear: Secondary | ICD-10-CM | POA: Diagnosis not present

## 2019-11-13 DIAGNOSIS — H547 Unspecified visual loss: Secondary | ICD-10-CM | POA: Diagnosis not present

## 2019-11-13 DIAGNOSIS — J440 Chronic obstructive pulmonary disease with acute lower respiratory infection: Secondary | ICD-10-CM | POA: Diagnosis not present

## 2019-11-13 DIAGNOSIS — G43909 Migraine, unspecified, not intractable, without status migrainosus: Secondary | ICD-10-CM | POA: Diagnosis not present

## 2019-11-13 DIAGNOSIS — Z85038 Personal history of other malignant neoplasm of large intestine: Secondary | ICD-10-CM | POA: Diagnosis not present

## 2019-11-13 DIAGNOSIS — J441 Chronic obstructive pulmonary disease with (acute) exacerbation: Secondary | ICD-10-CM | POA: Diagnosis not present

## 2019-11-13 DIAGNOSIS — M19012 Primary osteoarthritis, left shoulder: Secondary | ICD-10-CM | POA: Diagnosis not present

## 2019-11-13 DIAGNOSIS — K219 Gastro-esophageal reflux disease without esophagitis: Secondary | ICD-10-CM | POA: Diagnosis not present

## 2019-11-13 DIAGNOSIS — E785 Hyperlipidemia, unspecified: Secondary | ICD-10-CM | POA: Diagnosis not present

## 2019-11-13 DIAGNOSIS — Z79891 Long term (current) use of opiate analgesic: Secondary | ICD-10-CM | POA: Diagnosis not present

## 2019-11-13 DIAGNOSIS — R42 Dizziness and giddiness: Secondary | ICD-10-CM | POA: Diagnosis not present

## 2019-11-13 DIAGNOSIS — I700 Atherosclerosis of aorta: Secondary | ICD-10-CM | POA: Diagnosis not present

## 2019-11-13 DIAGNOSIS — I081 Rheumatic disorders of both mitral and tricuspid valves: Secondary | ICD-10-CM | POA: Diagnosis not present

## 2019-11-13 DIAGNOSIS — Z7982 Long term (current) use of aspirin: Secondary | ICD-10-CM | POA: Diagnosis not present

## 2019-11-13 DIAGNOSIS — G510 Bell's palsy: Secondary | ICD-10-CM | POA: Diagnosis not present

## 2019-11-13 DIAGNOSIS — E119 Type 2 diabetes mellitus without complications: Secondary | ICD-10-CM | POA: Diagnosis not present

## 2019-11-13 DIAGNOSIS — I110 Hypertensive heart disease with heart failure: Secondary | ICD-10-CM | POA: Diagnosis not present

## 2019-11-27 ENCOUNTER — Ambulatory Visit: Admitting: Podiatry

## 2019-12-03 ENCOUNTER — Ambulatory Visit (INDEPENDENT_AMBULATORY_CARE_PROVIDER_SITE_OTHER): Payer: Medicare Other | Admitting: Podiatry

## 2019-12-03 ENCOUNTER — Other Ambulatory Visit: Payer: Self-pay

## 2019-12-03 DIAGNOSIS — E1151 Type 2 diabetes mellitus with diabetic peripheral angiopathy without gangrene: Secondary | ICD-10-CM | POA: Diagnosis not present

## 2019-12-03 DIAGNOSIS — B351 Tinea unguium: Secondary | ICD-10-CM | POA: Diagnosis not present

## 2019-12-03 DIAGNOSIS — E1169 Type 2 diabetes mellitus with other specified complication: Secondary | ICD-10-CM

## 2019-12-03 NOTE — Progress Notes (Signed)
  Subjective:  Patient ID: Heather Mccoy, female    DOB: 10-03-1945,  MRN: [id number]  Chief Complaint  Patient presents with  . Nail Problem    pt states she is still having issues/pain at BL hallux medial borders; 8/10 sharp constan pain -per pt with swlling -pt dneis draiange/redness tx: epom salt -pt alos wants nail trimming of all nails    74 y.o. female presents with the above complaint. History confirmed with patient.   Objective:  Physical Exam: warm, good capillary refill, nail exam onychomycosis of the toenails, no trophic changes or ulcerative lesions. DP pulses palpable and protective sensation intact, PT pulses non-palpable Left Foot: normal exam, no swelling, tenderness, instability; ligaments intact, full range of motion of all ankle/foot joints  Right Foot: normal exam, no swelling, tenderness, instability; ligaments intact, full range of motion of all ankle/foot joints   No images are attached to the encounter.  Assessment:   1. Onychomycosis of multiple toenails with type 2 diabetes mellitus and peripheral angiopathy ([place])      Plan:  Patient was evaluated and treated and all questions answered.  Onychomycosis, Diabetes and PAD -Patient is diabetic with a qualifying condition for at risk foot care.  Procedure: Nail Debridement Rationale: Patient meets criteria for routine foot care due to PAD Type of Debridement: manual, sharp debridement. Instrumentation: Nail nipper, rotary burr. Number of Nails: 10  Return in about 3 months (around 03/04/2020) for Diabetic Foot Care.

## 2019-12-05 DIAGNOSIS — K580 Irritable bowel syndrome with diarrhea: Secondary | ICD-10-CM | POA: Diagnosis not present

## 2019-12-05 DIAGNOSIS — D649 Anemia, unspecified: Secondary | ICD-10-CM | POA: Diagnosis not present

## 2019-12-05 DIAGNOSIS — I509 Heart failure, unspecified: Secondary | ICD-10-CM | POA: Diagnosis not present

## 2019-12-05 DIAGNOSIS — R1084 Generalized abdominal pain: Secondary | ICD-10-CM | POA: Diagnosis not present

## 2019-12-06 DIAGNOSIS — H35353 Cystoid macular degeneration, bilateral: Secondary | ICD-10-CM | POA: Diagnosis not present

## 2019-12-24 DIAGNOSIS — D649 Anemia, unspecified: Secondary | ICD-10-CM | POA: Diagnosis not present

## 2019-12-24 DIAGNOSIS — Z85038 Personal history of other malignant neoplasm of large intestine: Secondary | ICD-10-CM | POA: Diagnosis not present

## 2019-12-24 DIAGNOSIS — K580 Irritable bowel syndrome with diarrhea: Secondary | ICD-10-CM | POA: Diagnosis not present

## 2019-12-24 DIAGNOSIS — R1084 Generalized abdominal pain: Secondary | ICD-10-CM | POA: Diagnosis not present

## 2019-12-30 ENCOUNTER — Other Ambulatory Visit: Payer: Self-pay | Admitting: Cardiology

## 2020-01-17 ENCOUNTER — Encounter: Payer: Self-pay | Admitting: Cardiology

## 2020-01-17 ENCOUNTER — Ambulatory Visit (INDEPENDENT_AMBULATORY_CARE_PROVIDER_SITE_OTHER): Payer: Medicare Other | Admitting: Cardiology

## 2020-01-17 ENCOUNTER — Other Ambulatory Visit: Payer: Self-pay

## 2020-01-17 VITALS — BP 108/60 | HR 56 | Ht 62.0 in | Wt 135.8 lb

## 2020-01-17 DIAGNOSIS — E782 Mixed hyperlipidemia: Secondary | ICD-10-CM | POA: Diagnosis not present

## 2020-01-17 DIAGNOSIS — E1100 Type 2 diabetes mellitus with hyperosmolarity without nonketotic hyperglycemic-hyperosmolar coma (NKHHC): Secondary | ICD-10-CM | POA: Diagnosis not present

## 2020-01-17 DIAGNOSIS — I471 Supraventricular tachycardia: Secondary | ICD-10-CM

## 2020-01-17 DIAGNOSIS — I25118 Atherosclerotic heart disease of native coronary artery with other forms of angina pectoris: Secondary | ICD-10-CM

## 2020-01-17 DIAGNOSIS — Z79899 Other long term (current) drug therapy: Secondary | ICD-10-CM

## 2020-01-17 DIAGNOSIS — I10 Essential (primary) hypertension: Secondary | ICD-10-CM

## 2020-01-17 MED ORDER — RANOLAZINE ER 1000 MG PO TB12: 1000.0000 mg | ORAL_TABLET | Freq: Two times a day (BID) | ORAL | 3 refills | Status: DC

## 2020-01-17 NOTE — Patient Instructions (Addendum)
Medication Instructions:  Increase Ranolazine (Ranexa) to 1000 mg twice a day   *If you need a refill on your cardiac medications before your next appointment, please call your pharmacy*   Lab Work: Bmp, Magnesium   If you have labs (blood work) drawn today and your tests are completely normal, you will receive your results only by: Marland Kitchen MyChart Message (if you have MyChart) OR . A paper copy in the mail If you have any lab test that is abnormal or we need to change your treatment, we will call you to review the results.   Testing/Procedures: Your physician has requested that you have a lower or Upper extremity arterial duplex. This test is an ultrasound of the arteries in the legs or arms. It looks at arterial blood flow in the legs and arms. Allow one hour for Lower and Upper Arterial scans. There are no restrictions or special instructions    Follow-Up: At [facility], you and your health needs are our priority.  As part of our continuing mission to provide you with exceptional heart care, we have created designated Provider Care Teams.  These Care Teams include your primary Cardiologist (physician) and Advanced Practice Providers (APPs -  Physician Assistants and Nurse Practitioners) who all work together to provide you with the care you need, when you need it.  We recommend signing up for the patient portal called "MyChart".  Sign up information is provided on this After Visit Summary.  MyChart is used to connect with patients for Virtual Visits (Telemedicine).  Patients are able to view lab/test results, encounter notes, upcoming appointments, etc.  Non-urgent messages can be sent to your provider as well.   To learn more about what you can do with MyChart, go to [email or web address].    Your next appointment:   4 week(s)  The format for your next appointment:   In Person  Provider:   Berniece Salines, DO   Other Instructions None

## 2020-01-17 NOTE — Progress Notes (Signed)
Cardiology Office Note:    Date:  01/17/2020   ID:  Heather Mccoy, DOB November 11, 1945, MRN [id number]  PCP:  Lind Guest, NP  Cardiologist:  Berniece Salines, DO  Electrophysiologist:  None   Referring MD: Haydee Salter Np, NP   " I have been having some chest pain"   History of Present Illness:    Heather Mccoy a 74 y.o.femalewith a hx of diabetes mellitus, colon cancer status post surgery, chronic diarrhea, anxiety, COPD not on oxygen, tobacco use, hypertension, CAD seen on Coronary CTAand hyperlipidemiapresents for follow-up visit.  I initially saw the patient onSeptember 4, 2020 at which time she was posthospitalization. She was hospitalized at [facility] in August where she was treated for syncope due to orthostatic hypotension. Her medications were optimized which included stopping her diuretics and she was started on midodrine and Florinef.On completion of our visit given her elevated blood pressure I did advise patient to only take her midodrine 5 mg 3 times daily if her systolic blood pressure was less than 120. Since her visit she has not needed to take the midodrine. She was also given a ZIO patch which she is still wearing to complete a 14-day monitoring. She was continued on the fludrocortisone 0.1 mg daily  The patient was seenon 02/05/2019.During that visit she was hypertensive and was still onfludrocortisone 0.1 mg daily. Started to titrate off the fludrocortisone. In addition the patient at the time did not want to start any antihypertensive medication. She was still wearing her Zio patch during the time of her visit.At her last visit October 14,2020 patient did have evidence of paroxysmal atrial tachycardiashe was started on carvedilol 6.25 mg twice daily.  He was seen on April 02, 2019 at which time she reported intermittent chest pain she described as left-sided burning sensation which radiated to her left arm and also caused left arm  numbness. During her visit we discussed getting a CTA coronaries.However at the conclusion of her visit the patient was being wanted checked out she did have an episode where she felt significantly lightheaded and was guided to the floor without falling or passing out.The patient was taken to [facility] at that time thought to be vasovagal syncope.   On April 16, 2019 I saw the patient in follow-up visit and she is still having some chest pain but tells me it did respond to her Imdur, and herCCTA was scheduled.  I did see the patient on 07/13/19 to discuss her CCTA results which showed evidence of coronay artery disease. She was started on renaxa 500mg  BID. She was also hypertensive, thereforeI increased her carvedilol to 12.5 mg twice daily.  [facility] droop and weakness. She was diagnosed with bells palsy. She has been experiencing worsening diarrhea - she does have chronic diarrhea but she notes that this has gotten worse.  At her visit on July 31, 2019 at that time increase her Ranexa to 1000 mg twice a day. In the interim she was admitted to the [facility] for pneumonia and was placed on antibiotic for a while.   On Sep 18, 2019 at that time she was experiencing some dizziness which sounded like vertigo referred the patient to ENT.  She also was hypotensive therefore I decreased hydralazine.  At her October 17, 2019 visit which was her most recent visit her dizziness has improved significantly since her blood pressure medication was also changed.  She offers  no complaints at that time she was continue on that regimen.  Today she comes for follow-up visit.  She tells me she has been experiencing intermittent chest pain.  On review of her medication regimen it seems that she had cut back on her Ranexa to 5 mg twice daily.   Past Medical History:  Diagnosis Date  . Anxiety   . Arthritis   . Bell's  palsy 07/31/2019  . Cancer ([place])    colon  . Chronic diarrhea of unknown origin 01/12/2012  . COPD (chronic obstructive pulmonary disease) ([place])   . Coronary artery disease of native artery of native heart with stable angina pectoris ([place]) 06/11/2019  . Diabetes mellitus   . Dysrhythmia   . Essential hypertension 06/11/2019  . Hyperlipemia 01/19/2019  . Hyperlipidemia   . Hypertension   . Mixed hyperlipidemia 06/11/2019  . Mood disorder ([place])   . Normocytic anemia 09/12/2014  . Orthostatic hypotension   . Paroxysmal atrial tachycardia ([place]) 06/11/2019  . Shortness of breath   . Type II diabetes mellitus ([place]) 01/19/2019  . Vitamin B12 deficiency 09/13/2014  . Weight loss 09/12/2014    Past Surgical History:  Procedure Laterality Date  . APPENDECTOMY    . BACK SURGERY    . CHOLECYSTECTOMY    . COLON SURGERY    . EUS  01/12/2012   Procedure: UPPER ENDOSCOPIC ULTRASOUND (EUS) RADIAL;  Surgeon: Arta Silence, MD;  Location: [facility] ENDOSCOPY;  Service: Endoscopy;  Laterality: N/A;  to be admitted after  . HAND SURGERY    . REPLACEMENT TOTAL KNEE      Current Medications: Current Meds  Medication Sig  . alendronate (FOSAMAX) 70 MG tablet Take 70 mg by mouth once a week. Take with a full glass of water on an empty stomach.  Marland Kitchen amLODipine (NORVASC) 2.5 MG tablet Take 2.5 mg by mouth daily.  Marland Kitchen aspirin EC 81 MG tablet Take 81 mg by mouth daily.  Marland Kitchen atorvastatin (LIPITOR) 40 MG tablet Take 40 mg by mouth daily.  . carvedilol (COREG) 12.5 MG tablet Take 12.5 mg by mouth 2 (two) times daily.  . citalopram (CELEXA) 10 MG tablet Take 10 mg by mouth daily.  Marland Kitchen dicyclomine (BENTYL) 10 MG capsule Take 1 capsule by mouth 3 (three) times daily before meals.   . diphenoxylate-atropine (LOMOTIL) 2.5-0.025 MG tablet Take 2 tablets by mouth 4 (four) times daily.  . famotidine (PEPCID) 40 MG tablet Take 1 tablet by mouth daily.  . FEROSUL 325 (65 Fe) MG tablet Take 325 mg by mouth daily.  . furosemide (LASIX) 20  MG tablet Take 20 mg by mouth daily as needed.  . hydrOXYzine (ATARAX/VISTARIL) 25 MG tablet Take 25 mg by mouth 3 (three) times daily as needed.  . loperamide (IMODIUM) 2 MG capsule Take 2 mg by mouth every 6 (six) hours as needed for diarrhea or loose stools.  . meclizine (ANTIVERT) 25 MG tablet Take 25 mg by mouth every 6 (six) hours as needed for dizziness.  . montelukast (SINGULAIR) 10 MG tablet TAKE 1 TABLET BY MOUTH AT BEDTIME  . ondansetron (ZOFRAN) 4 MG tablet Take 4 mg by mouth 2 (two) times daily as needed.  . pantoprazole (PROTONIX) 40 MG tablet Take 40 mg by mouth daily.  . potassium chloride SA (KLOR-CON) 20 MEQ tablet Take 20 mEq by mouth daily.  . [DISCONTINUED] ranolazine (RANEXA) 500 MG 12 hr tablet TAKE 1 TABLET(500 MG) BY MOUTH TWICE DAILY     Allergies:  Codeine, Morphine, Nitroglycerin, Tylenol [acetaminophen], and Oxycodone   Social History   Socioeconomic History  . Marital status: Widowed    Spouse name: Not on file  . Number of children: Not on file  . Years of education: Not on file  . Highest education level: Not on file  Occupational History  . Not on file  Tobacco Use  . Smoking status: Current Every Day Smoker    Packs/day: 0.50    Types: Cigarettes  . Smokeless tobacco: Never Used  Vaping Use  . Vaping Use: Never used  Substance and Sexual Activity  . Alcohol use: No  . Drug use: No  . Sexual activity: Never  Other Topics Concern  . Not on file  Social History Narrative  . Not on file   Social Determinants of Health   Financial Resource Strain:   . Difficulty of Paying Living Expenses: Not on file  Food Insecurity:   . Worried About Charity fundraiser in the Last Year: Not on file  . Ran Out of Food in the Last Year: Not on file  Transportation Needs:   . Lack of Transportation (Medical): Not on file  . Lack of Transportation (Non-Medical): Not on file  Physical Activity:   . Days of Exercise per Week: Not on file  . Minutes of  Exercise per Session: Not on file  Stress:   . Feeling of Stress : Not on file  Social Connections:   . Frequency of Communication with Friends and Family: Not on file  . Frequency of Social Gatherings with Friends and Family: Not on file  . Attends Religious Services: Not on file  . Active Member of Clubs or Organizations: Not on file  . Attends Archivist Meetings: Not on file  . Marital Status: Not on file     Family History: The patient's family history includes CVA in her father; Cerebral aneurysm in her mother; Heart attack in her father and sister.  ROS:   Review of Systems  Constitution: Negative for decreased appetite, fever and weight gain.  HENT: Negative for congestion, ear discharge, hoarse voice and sore throat.   Eyes: Negative for discharge, redness, vision loss in right eye and visual halos.  Cardiovascular: Negative for chest pain, dyspnea on exertion, leg swelling, orthopnea and palpitations.  Respiratory: Negative for cough, hemoptysis, shortness of breath and snoring.   Endocrine: Negative for heat intolerance and polyphagia.  Hematologic/Lymphatic: Negative for bleeding problem. Does not bruise/bleed easily.  Skin: Negative for flushing, nail changes, rash and suspicious lesions.  Musculoskeletal: Negative for arthritis, joint pain, muscle cramps, myalgias, neck pain and stiffness.  Gastrointestinal: Negative for abdominal pain, bowel incontinence, diarrhea and excessive appetite.  Genitourinary: Negative for decreased libido, genital sores and incomplete emptying.  Neurological: Negative for brief paralysis, focal weakness, headaches and loss of balance.  Psychiatric/Behavioral: Negative for altered mental status, depression and suicidal ideas.  Allergic/Immunologic: Negative for HIV exposure and persistent infections.    EKGs/Labs/Other Studies Reviewed:    The following studies were reviewed today:   EKG:  The ekg ordered today demonstrates    Recent Labs: 01/26/2019: Magnesium 1.8 06/05/2019: BUN 11; Creatinine, Ser 0.53; Potassium 4.3; Sodium 141  Recent Lipid Panel No results found for: CHOL, TRIG, HDL, CHOLHDL, VLDL, LDLCALC, LDLDIRECT  Physical Exam:    VS:  BP 108/60   Pulse (!) 56   Ht 5\' 2"  (1.575 m)   Wt 135 lb 12.8 oz (61.6 kg)   SpO2 95%  BMI 24.84 kg/m     Wt Readings from Last 3 Encounters:  01/17/20 135 lb 12.8 oz (61.6 kg)  10/17/19 135 lb (61.2 kg)  09/18/19 135 lb (61.2 kg)     GEN: Well nourished, well developed in no acute distress HEENT: Normal NECK: No JVD; No carotid bruits LYMPHATICS: No lymphadenopathy CARDIAC: S1S2 noted,RRR, no murmurs, rubs, gallops RESPIRATORY:  Clear to auscultation without rales, wheezing or rhonchi  ABDOMEN: Soft, non-tender, non-distended, +bowel sounds, no guarding. EXTREMITIES: No edema, No cyanosis, no clubbing MUSCULOSKELETAL:  No deformity  SKIN: Warm and dry NEUROLOGIC:  Alert and oriented x 3, non-focal PSYCHIATRIC:  Normal affect, good insight  ASSESSMENT:    1. Coronary artery disease of native artery of native heart with stable angina pectoris ([place])   2. Medication management   3. Paroxysmal atrial tachycardia ([place])   4. Type 2 diabetes mellitus with hyperosmolarity without coma, without long-term current use of insulin ([place])   5. Mixed hyperlipidemia    PLAN:      We will put the patient back on Ranexa 1000 mg twice a day.Marland Kitchen  Hopefully this will help with her chest pain which I am suspecting is angina.  If she does not improve from this time get her referred her for a left heart catheterization.  She does have known coronary artery disease based on her coronary CTA.  She denies any dizziness at this time.  Her blood pressure deceptively in the office no change will be made to her antihypertensive medication.  Unfortunately she does have a swelling which is significantly tender at her right elbow.  It is a bulging suspect the pocket of blood.  I  am going to get arterial Doppler to understand more about this.    The patient is in agreement with the above plan. The patient left the office in stable condition.  The patient will follow up in 1 month   Medication Adjustments/Labs and Tests Ordered: Current medicines are reviewed at length with the patient today.  Concerns regarding medicines are outlined above.  Orders Placed This Encounter  Procedures  . Basic metabolic panel  . Basic metabolic panel  . Magnesium  . VAS [place] LOWER EXTREMITY ARTERIAL DUPLEX   Meds ordered this encounter  Medications  . ranolazine (RANEXA) 1000 MG SR tablet    Sig: Take 1 tablet (1,000 mg total) by mouth 2 (two) times daily.    Dispense:  180 tablet    Refill:  3    Patient Instructions  Medication Instructions:  Increase Ranolazine (Ranexa) to 1000 mg twice a day   *If you need a refill on your cardiac medications before your next appointment, please call your pharmacy*   Lab Work: Bmp, Magnesium   If you have labs (blood work) drawn today and your tests are completely normal, you will receive your results only by: Marland Kitchen MyChart Message (if you have MyChart) OR . A paper copy in the mail If you have any lab test that is abnormal or we need to change your treatment, we will call you to review the results.   Testing/Procedures: Your physician has requested that you have a lower or Upper extremity arterial duplex. This test is an ultrasound of the arteries in the legs or arms. It looks at arterial blood flow in the legs and arms. Allow one hour for Lower and Upper Arterial scans. There are no restrictions or special instructions    Follow-Up: At [facility], you and your health needs  are our priority.  As part of our continuing mission to provide you with exceptional heart care, we have created designated Provider Care Teams.  These Care Teams include your primary Cardiologist (physician) and Advanced Practice Providers (APPs -  Physician  Assistants and Nurse Practitioners) who all work together to provide you with the care you need, when you need it.  We recommend signing up for the patient portal called "MyChart".  Sign up information is provided on this After Visit Summary.  MyChart is used to connect with patients for Virtual Visits (Telemedicine).  Patients are able to view lab/test results, encounter notes, upcoming appointments, etc.  Non-urgent messages can be sent to your provider as well.   To learn more about what you can do with MyChart, go to [email or web address].    Your next appointment:   4 week(s)  The format for your next appointment:   In Person  Provider:   Berniece Salines, DO   Other Instructions None      Adopting a Healthy Lifestyle.  Know what a healthy weight is for you (roughly BMI <25) and aim to maintain this   Aim for 7+ servings of fruits and vegetables daily   65-80+ fluid ounces of water or unsweet tea for healthy kidneys   Limit to max 1 drink of alcohol per day; avoid smoking/tobacco   Limit animal fats in diet for cholesterol and heart health - choose grass fed whenever available   Avoid highly processed foods, and foods high in saturated/trans fats   Aim for low stress - take time to unwind and care for your mental health   Aim for 150 min of moderate intensity exercise weekly for heart health, and weights twice weekly for bone health   Aim for 7-9 hours of sleep daily   When it comes to diets, agreement about the perfect plan isnt easy to find, even among the experts. Experts at the [employer] developed an idea known as the Healthy Eating Plate. Just imagine a plate divided into logical, healthy portions.   The emphasis is on diet quality:   Load up on vegetables and fruits - one-half of your plate: Aim for color and variety, and remember that potatoes dont count.   Go for whole grains - one-quarter of your plate: Whole wheat, barley, wheat  berries, quinoa, oats, brown rice, and foods made with them. If you want pasta, go with whole wheat pasta.   Protein power - one-quarter of your plate: Fish, chicken, beans, and nuts are all healthy, versatile protein sources. Limit red meat.   The diet, however, does go beyond the plate, offering a few other suggestions.   Use healthy plant oils, such as olive, canola, soy, corn, sunflower and peanut. Check the labels, and avoid partially hydrogenated oil, which have unhealthy trans fats.   If youre thirsty, drink water. Coffee and tea are good in moderation, but skip sugary drinks and limit milk and dairy products to one or two daily servings.   The type of carbohydrate in the diet is more important than the amount. Some sources of carbohydrates, such as vegetables, fruits, whole grains, and beans-are healthier than others.   Finally, stay active  Signed, Berniece Salines, DO  01/17/2020 1:44 PM    [facility]

## 2020-01-18 ENCOUNTER — Telehealth: Payer: Self-pay | Admitting: Cardiology

## 2020-01-18 LAB — BASIC METABOLIC PANEL
BUN/Creatinine Ratio: 21 (ref 12–28)
BUN: 17 mg/dL (ref 8–27)
CO2: 24 mmol/L (ref 20–29)
Calcium: 9.1 mg/dL (ref 8.7–10.3)
Chloride: 105 mmol/L (ref 96–106)
Creatinine, Ser: 0.81 mg/dL (ref 0.57–1.00)
GFR calc Af Amer: 83 mL/min/{1.73_m2} (ref >59)
GFR calc non Af Amer: 72 mL/min/{1.73_m2} (ref >59)
Glucose: 85 mg/dL (ref 65–99)
Potassium: 4.5 mmol/L (ref 3.5–5.2)
Sodium: 143 mmol/L (ref 134–144)

## 2020-01-18 LAB — MAGNESIUM: Magnesium: 1.5 mg/dL — ABNORMAL LOW (ref 1.6–2.3)

## 2020-01-18 NOTE — Telephone Encounter (Signed)
    Pt c/o medication issue:  1. Name of Medication: ranolazine (RANEXA) 1000 MG SR tablet  2. How are you currently taking this medication (dosage and times per day)? Take 1 tablet (1,000 mg total) by mouth 2 (two) times daily.  3. Are you having a reaction (difficulty breathing--STAT)?   4. What is your medication issue? Pt said she tried to pick up this medication and it cost $175. She would like to ask Dr. Harriet Masson if there's a cheaper alternative medication

## 2020-01-18 NOTE — Telephone Encounter (Signed)
Unfortunately there is not, 1 option be for her to break in half and take one half twice daily.

## 2020-01-18 NOTE — Telephone Encounter (Signed)
Left message on patients voicemail to please return our call.   

## 2020-01-18 NOTE — Telephone Encounter (Signed)
Pt c/o medication issue:  1. Name of Medication: ranolazine (RANEXA) 1000 MG SR tablet  2. How are you currently taking this medication (dosage and times per day)? n/a  3. Are you having a reaction (difficulty breathing--STAT)? no  4. What is your medication issue? Patient states that this medication is too expensive and wants to know if there is a cheaper version.

## 2020-01-18 NOTE — Telephone Encounter (Signed)
Spoke with the patient just now and let her know Dr. Joya Gaskins recommendations. She verbalizes understanding and thanks me for the call back.

## 2020-01-22 ENCOUNTER — Telehealth: Payer: Self-pay

## 2020-01-22 ENCOUNTER — Other Ambulatory Visit (INDEPENDENT_AMBULATORY_CARE_PROVIDER_SITE_OTHER): Payer: Medicare Other

## 2020-01-22 DIAGNOSIS — I10 Essential (primary) hypertension: Secondary | ICD-10-CM

## 2020-01-22 DIAGNOSIS — I471 Supraventricular tachycardia: Secondary | ICD-10-CM

## 2020-01-22 NOTE — Telephone Encounter (Signed)
Per Dr. Harriet Masson:  Please send magnesium oxide 400bmg twice daily for 5 days.     Left message on patients voicemail to please return our call.

## 2020-01-22 NOTE — Telephone Encounter (Signed)
-----   Message from Berniece Salines, DO sent at 01/19/2020  9:11 AM EDT ----- Please let the patient know that her magnesium is low and we need to replete it.

## 2020-01-22 NOTE — Telephone Encounter (Signed)
Left message on patients voicemail to please return our call.   

## 2020-01-23 ENCOUNTER — Telehealth: Payer: Self-pay

## 2020-01-23 NOTE — Telephone Encounter (Signed)
Left message on patients voicemail to please return our call.   

## 2020-01-23 NOTE — Telephone Encounter (Signed)
-----   Message from Berniece Salines, DO sent at 01/19/2020  9:11 AM EDT ----- Please let the patient know that her magnesium is low and we need to replete it.

## 2020-01-24 ENCOUNTER — Telehealth: Payer: Self-pay

## 2020-01-24 NOTE — Telephone Encounter (Signed)
-----   Message from Berniece Salines, DO sent at 01/19/2020  9:11 AM EDT ----- Please let the patient know that her magnesium is low and we need to replete it.

## 2020-01-24 NOTE — Telephone Encounter (Signed)
Left message on patients voicemail to please return our call.   

## 2020-01-25 ENCOUNTER — Telehealth: Payer: Self-pay

## 2020-01-25 NOTE — Telephone Encounter (Signed)
Left message on patients voicemail to please return our call.   

## 2020-01-25 NOTE — Telephone Encounter (Signed)
-----   Message from Berniece Salines, DO sent at 01/19/2020  9:11 AM EDT ----- Please let the patient know that her magnesium is low and we need to replete it.

## 2020-01-28 ENCOUNTER — Telehealth: Payer: Self-pay

## 2020-01-28 MED ORDER — MAGNESIUM OXIDE 400 MG PO CAPS: 400.0000 mg | ORAL_CAPSULE | Freq: Two times a day (BID) | ORAL | 0 refills | Status: DC

## 2020-01-28 NOTE — Telephone Encounter (Signed)
-----   Message from Berniece Salines, DO sent at 01/19/2020  9:11 AM EDT ----- Please let the patient know that her magnesium is low and we need to replete it.

## 2020-01-28 NOTE — Telephone Encounter (Signed)
Spoke with patient regarding results and recommendation.  Patient verbalizes understanding and is agreeable to plan of care. Advised patient to call back with any issues or concerns.  

## 2020-02-01 DIAGNOSIS — R42 Dizziness and giddiness: Secondary | ICD-10-CM | POA: Diagnosis not present

## 2020-02-01 DIAGNOSIS — I2510 Atherosclerotic heart disease of native coronary artery without angina pectoris: Secondary | ICD-10-CM | POA: Diagnosis not present

## 2020-02-01 DIAGNOSIS — E1142 Type 2 diabetes mellitus with diabetic polyneuropathy: Secondary | ICD-10-CM | POA: Diagnosis not present

## 2020-02-01 DIAGNOSIS — J449 Chronic obstructive pulmonary disease, unspecified: Secondary | ICD-10-CM | POA: Diagnosis not present

## 2020-02-01 DIAGNOSIS — Z87440 Personal history of urinary (tract) infections: Secondary | ICD-10-CM | POA: Diagnosis not present

## 2020-02-01 DIAGNOSIS — Z8673 Personal history of transient ischemic attack (TIA), and cerebral infarction without residual deficits: Secondary | ICD-10-CM | POA: Diagnosis not present

## 2020-02-01 DIAGNOSIS — F17210 Nicotine dependence, cigarettes, uncomplicated: Secondary | ICD-10-CM | POA: Diagnosis not present

## 2020-02-01 DIAGNOSIS — I10 Essential (primary) hypertension: Secondary | ICD-10-CM | POA: Diagnosis not present

## 2020-02-01 DIAGNOSIS — Z7982 Long term (current) use of aspirin: Secondary | ICD-10-CM | POA: Diagnosis not present

## 2020-02-01 DIAGNOSIS — Z79899 Other long term (current) drug therapy: Secondary | ICD-10-CM | POA: Diagnosis not present

## 2020-02-01 DIAGNOSIS — K219 Gastro-esophageal reflux disease without esophagitis: Secondary | ICD-10-CM | POA: Diagnosis not present

## 2020-02-01 DIAGNOSIS — Z888 Allergy status to other drugs, medicaments and biological substances status: Secondary | ICD-10-CM | POA: Diagnosis not present

## 2020-02-01 DIAGNOSIS — R55 Syncope and collapse: Secondary | ICD-10-CM | POA: Diagnosis not present

## 2020-02-01 DIAGNOSIS — E785 Hyperlipidemia, unspecified: Secondary | ICD-10-CM | POA: Diagnosis not present

## 2020-02-01 DIAGNOSIS — Z885 Allergy status to narcotic agent status: Secondary | ICD-10-CM | POA: Diagnosis not present

## 2020-02-01 DIAGNOSIS — Z85038 Personal history of other malignant neoplasm of large intestine: Secondary | ICD-10-CM | POA: Diagnosis not present

## 2020-02-01 DIAGNOSIS — D649 Anemia, unspecified: Secondary | ICD-10-CM | POA: Diagnosis not present

## 2020-02-02 DIAGNOSIS — E785 Hyperlipidemia, unspecified: Secondary | ICD-10-CM | POA: Diagnosis not present

## 2020-02-02 DIAGNOSIS — I10 Essential (primary) hypertension: Secondary | ICD-10-CM | POA: Diagnosis not present

## 2020-02-02 DIAGNOSIS — R55 Syncope and collapse: Secondary | ICD-10-CM | POA: Diagnosis not present

## 2020-02-07 DIAGNOSIS — R55 Syncope and collapse: Secondary | ICD-10-CM | POA: Diagnosis not present

## 2020-02-07 DIAGNOSIS — Z09 Encounter for follow-up examination after completed treatment for conditions other than malignant neoplasm: Secondary | ICD-10-CM | POA: Diagnosis not present

## 2020-02-09 DIAGNOSIS — K580 Irritable bowel syndrome with diarrhea: Secondary | ICD-10-CM | POA: Diagnosis not present

## 2020-02-09 DIAGNOSIS — M5030 Other cervical disc degeneration, unspecified cervical region: Secondary | ICD-10-CM | POA: Diagnosis not present

## 2020-02-09 DIAGNOSIS — D649 Anemia, unspecified: Secondary | ICD-10-CM | POA: Diagnosis not present

## 2020-02-09 DIAGNOSIS — Z9181 History of falling: Secondary | ICD-10-CM | POA: Diagnosis not present

## 2020-02-09 DIAGNOSIS — E785 Hyperlipidemia, unspecified: Secondary | ICD-10-CM | POA: Diagnosis not present

## 2020-02-09 DIAGNOSIS — Z87440 Personal history of urinary (tract) infections: Secondary | ICD-10-CM | POA: Diagnosis not present

## 2020-02-09 DIAGNOSIS — Z79891 Long term (current) use of opiate analgesic: Secondary | ICD-10-CM | POA: Diagnosis not present

## 2020-02-09 DIAGNOSIS — F17210 Nicotine dependence, cigarettes, uncomplicated: Secondary | ICD-10-CM | POA: Diagnosis not present

## 2020-02-09 DIAGNOSIS — G510 Bell's palsy: Secondary | ICD-10-CM | POA: Diagnosis not present

## 2020-02-09 DIAGNOSIS — E278 Other specified disorders of adrenal gland: Secondary | ICD-10-CM | POA: Diagnosis not present

## 2020-02-09 DIAGNOSIS — M4302 Spondylolysis, cervical region: Secondary | ICD-10-CM | POA: Diagnosis not present

## 2020-02-09 DIAGNOSIS — E119 Type 2 diabetes mellitus without complications: Secondary | ICD-10-CM | POA: Diagnosis not present

## 2020-02-09 DIAGNOSIS — M19012 Primary osteoarthritis, left shoulder: Secondary | ICD-10-CM | POA: Diagnosis not present

## 2020-02-09 DIAGNOSIS — I110 Hypertensive heart disease with heart failure: Secondary | ICD-10-CM | POA: Diagnosis not present

## 2020-02-09 DIAGNOSIS — K219 Gastro-esophageal reflux disease without esophagitis: Secondary | ICD-10-CM | POA: Diagnosis not present

## 2020-02-09 DIAGNOSIS — Z85038 Personal history of other malignant neoplasm of large intestine: Secondary | ICD-10-CM | POA: Diagnosis not present

## 2020-02-09 DIAGNOSIS — M179 Osteoarthritis of knee, unspecified: Secondary | ICD-10-CM | POA: Diagnosis not present

## 2020-02-09 DIAGNOSIS — G43909 Migraine, unspecified, not intractable, without status migrainosus: Secondary | ICD-10-CM | POA: Diagnosis not present

## 2020-02-09 DIAGNOSIS — Z7982 Long term (current) use of aspirin: Secondary | ICD-10-CM | POA: Diagnosis not present

## 2020-02-09 DIAGNOSIS — I081 Rheumatic disorders of both mitral and tricuspid valves: Secondary | ICD-10-CM | POA: Diagnosis not present

## 2020-02-09 DIAGNOSIS — I700 Atherosclerosis of aorta: Secondary | ICD-10-CM | POA: Diagnosis not present

## 2020-02-09 DIAGNOSIS — H547 Unspecified visual loss: Secondary | ICD-10-CM | POA: Diagnosis not present

## 2020-02-09 DIAGNOSIS — I5032 Chronic diastolic (congestive) heart failure: Secondary | ICD-10-CM | POA: Diagnosis not present

## 2020-02-09 DIAGNOSIS — J449 Chronic obstructive pulmonary disease, unspecified: Secondary | ICD-10-CM | POA: Diagnosis not present

## 2020-02-12 DIAGNOSIS — D509 Iron deficiency anemia, unspecified: Secondary | ICD-10-CM | POA: Diagnosis not present

## 2020-02-12 DIAGNOSIS — K580 Irritable bowel syndrome with diarrhea: Secondary | ICD-10-CM | POA: Diagnosis not present

## 2020-02-14 ENCOUNTER — Other Ambulatory Visit: Payer: Self-pay

## 2020-02-14 ENCOUNTER — Ambulatory Visit (INDEPENDENT_AMBULATORY_CARE_PROVIDER_SITE_OTHER): Payer: Medicare Other

## 2020-02-14 ENCOUNTER — Other Ambulatory Visit: Payer: Self-pay | Admitting: Cardiology

## 2020-02-14 DIAGNOSIS — H547 Unspecified visual loss: Secondary | ICD-10-CM | POA: Diagnosis not present

## 2020-02-14 DIAGNOSIS — Z85038 Personal history of other malignant neoplasm of large intestine: Secondary | ICD-10-CM | POA: Diagnosis not present

## 2020-02-14 DIAGNOSIS — M4302 Spondylolysis, cervical region: Secondary | ICD-10-CM | POA: Diagnosis not present

## 2020-02-14 DIAGNOSIS — M19012 Primary osteoarthritis, left shoulder: Secondary | ICD-10-CM | POA: Diagnosis not present

## 2020-02-14 DIAGNOSIS — E785 Hyperlipidemia, unspecified: Secondary | ICD-10-CM | POA: Diagnosis not present

## 2020-02-14 DIAGNOSIS — K219 Gastro-esophageal reflux disease without esophagitis: Secondary | ICD-10-CM | POA: Diagnosis not present

## 2020-02-14 DIAGNOSIS — Z9181 History of falling: Secondary | ICD-10-CM | POA: Diagnosis not present

## 2020-02-14 DIAGNOSIS — G43909 Migraine, unspecified, not intractable, without status migrainosus: Secondary | ICD-10-CM | POA: Diagnosis not present

## 2020-02-14 DIAGNOSIS — I499 Cardiac arrhythmia, unspecified: Secondary | ICD-10-CM | POA: Insufficient documentation

## 2020-02-14 DIAGNOSIS — R0602 Shortness of breath: Secondary | ICD-10-CM | POA: Insufficient documentation

## 2020-02-14 DIAGNOSIS — M5030 Other cervical disc degeneration, unspecified cervical region: Secondary | ICD-10-CM | POA: Diagnosis not present

## 2020-02-14 DIAGNOSIS — D649 Anemia, unspecified: Secondary | ICD-10-CM | POA: Diagnosis not present

## 2020-02-14 DIAGNOSIS — I700 Atherosclerosis of aorta: Secondary | ICD-10-CM | POA: Diagnosis not present

## 2020-02-14 DIAGNOSIS — G510 Bell's palsy: Secondary | ICD-10-CM | POA: Diagnosis not present

## 2020-02-14 DIAGNOSIS — I25118 Atherosclerotic heart disease of native coronary artery with other forms of angina pectoris: Secondary | ICD-10-CM

## 2020-02-14 DIAGNOSIS — I081 Rheumatic disorders of both mitral and tricuspid valves: Secondary | ICD-10-CM | POA: Diagnosis not present

## 2020-02-14 DIAGNOSIS — J449 Chronic obstructive pulmonary disease, unspecified: Secondary | ICD-10-CM | POA: Diagnosis not present

## 2020-02-14 DIAGNOSIS — C801 Malignant (primary) neoplasm, unspecified: Secondary | ICD-10-CM | POA: Insufficient documentation

## 2020-02-14 DIAGNOSIS — Z87440 Personal history of urinary (tract) infections: Secondary | ICD-10-CM | POA: Diagnosis not present

## 2020-02-14 DIAGNOSIS — I110 Hypertensive heart disease with heart failure: Secondary | ICD-10-CM | POA: Diagnosis not present

## 2020-02-14 DIAGNOSIS — Z7982 Long term (current) use of aspirin: Secondary | ICD-10-CM | POA: Diagnosis not present

## 2020-02-14 DIAGNOSIS — K580 Irritable bowel syndrome with diarrhea: Secondary | ICD-10-CM | POA: Diagnosis not present

## 2020-02-14 DIAGNOSIS — Z79891 Long term (current) use of opiate analgesic: Secondary | ICD-10-CM | POA: Diagnosis not present

## 2020-02-14 DIAGNOSIS — M1990 Unspecified osteoarthritis, unspecified site: Secondary | ICD-10-CM | POA: Insufficient documentation

## 2020-02-14 DIAGNOSIS — I951 Orthostatic hypotension: Secondary | ICD-10-CM | POA: Insufficient documentation

## 2020-02-14 DIAGNOSIS — E119 Type 2 diabetes mellitus without complications: Secondary | ICD-10-CM | POA: Diagnosis not present

## 2020-02-14 DIAGNOSIS — M179 Osteoarthritis of knee, unspecified: Secondary | ICD-10-CM | POA: Diagnosis not present

## 2020-02-14 DIAGNOSIS — F39 Unspecified mood [affective] disorder: Secondary | ICD-10-CM | POA: Insufficient documentation

## 2020-02-14 DIAGNOSIS — I5032 Chronic diastolic (congestive) heart failure: Secondary | ICD-10-CM | POA: Diagnosis not present

## 2020-02-14 DIAGNOSIS — E278 Other specified disorders of adrenal gland: Secondary | ICD-10-CM | POA: Diagnosis not present

## 2020-02-14 DIAGNOSIS — F17210 Nicotine dependence, cigarettes, uncomplicated: Secondary | ICD-10-CM | POA: Diagnosis not present

## 2020-02-14 DIAGNOSIS — F419 Anxiety disorder, unspecified: Secondary | ICD-10-CM | POA: Insufficient documentation

## 2020-02-14 MED ORDER — MIDODRINE HCL 5 MG PO TABS: 5.0000 mg | ORAL_TABLET | Freq: Three times a day (TID) | ORAL | 3 refills | Status: DC

## 2020-02-14 NOTE — Progress Notes (Signed)
Upper extremity arterial exam performed.  Jimmy Kevina Piloto RDCS, RVT

## 2020-02-14 NOTE — Telephone Encounter (Signed)
Medication changes are stop Lasix and KCL. Start Midodine 5 mg q 8 hrs.  BP 135/72 HR 75 sitting while in for echo BP 112/77 HR 77 These VS were obtained by Dr. Harriet Masson. Left a VM for pt to callback.

## 2020-02-15 ENCOUNTER — Ambulatory Visit: Payer: Medicare Other | Admitting: Cardiology

## 2020-02-15 ENCOUNTER — Telehealth: Payer: Self-pay

## 2020-02-15 NOTE — Telephone Encounter (Signed)
Left voice mail for patient to call back to check on if she understands her new medication instructions for the Midodrine and the Furosemide.

## 2020-02-20 DIAGNOSIS — Z87440 Personal history of urinary (tract) infections: Secondary | ICD-10-CM | POA: Diagnosis not present

## 2020-02-20 DIAGNOSIS — J449 Chronic obstructive pulmonary disease, unspecified: Secondary | ICD-10-CM | POA: Diagnosis not present

## 2020-02-20 DIAGNOSIS — K580 Irritable bowel syndrome with diarrhea: Secondary | ICD-10-CM | POA: Diagnosis not present

## 2020-02-20 DIAGNOSIS — I5032 Chronic diastolic (congestive) heart failure: Secondary | ICD-10-CM | POA: Diagnosis not present

## 2020-02-20 DIAGNOSIS — Z7982 Long term (current) use of aspirin: Secondary | ICD-10-CM | POA: Diagnosis not present

## 2020-02-20 DIAGNOSIS — I081 Rheumatic disorders of both mitral and tricuspid valves: Secondary | ICD-10-CM | POA: Diagnosis not present

## 2020-02-20 DIAGNOSIS — I700 Atherosclerosis of aorta: Secondary | ICD-10-CM | POA: Diagnosis not present

## 2020-02-20 DIAGNOSIS — M19012 Primary osteoarthritis, left shoulder: Secondary | ICD-10-CM | POA: Diagnosis not present

## 2020-02-20 DIAGNOSIS — E785 Hyperlipidemia, unspecified: Secondary | ICD-10-CM | POA: Diagnosis not present

## 2020-02-20 DIAGNOSIS — G510 Bell's palsy: Secondary | ICD-10-CM | POA: Diagnosis not present

## 2020-02-20 DIAGNOSIS — E119 Type 2 diabetes mellitus without complications: Secondary | ICD-10-CM | POA: Diagnosis not present

## 2020-02-20 DIAGNOSIS — E278 Other specified disorders of adrenal gland: Secondary | ICD-10-CM | POA: Diagnosis not present

## 2020-02-20 DIAGNOSIS — G43909 Migraine, unspecified, not intractable, without status migrainosus: Secondary | ICD-10-CM | POA: Diagnosis not present

## 2020-02-20 DIAGNOSIS — Z79891 Long term (current) use of opiate analgesic: Secondary | ICD-10-CM | POA: Diagnosis not present

## 2020-02-20 DIAGNOSIS — M5030 Other cervical disc degeneration, unspecified cervical region: Secondary | ICD-10-CM | POA: Diagnosis not present

## 2020-02-20 DIAGNOSIS — H547 Unspecified visual loss: Secondary | ICD-10-CM | POA: Diagnosis not present

## 2020-02-20 DIAGNOSIS — Z85038 Personal history of other malignant neoplasm of large intestine: Secondary | ICD-10-CM | POA: Diagnosis not present

## 2020-02-20 DIAGNOSIS — Z9181 History of falling: Secondary | ICD-10-CM | POA: Diagnosis not present

## 2020-02-20 DIAGNOSIS — I110 Hypertensive heart disease with heart failure: Secondary | ICD-10-CM | POA: Diagnosis not present

## 2020-02-20 DIAGNOSIS — D649 Anemia, unspecified: Secondary | ICD-10-CM | POA: Diagnosis not present

## 2020-02-20 DIAGNOSIS — M179 Osteoarthritis of knee, unspecified: Secondary | ICD-10-CM | POA: Diagnosis not present

## 2020-02-20 DIAGNOSIS — K219 Gastro-esophageal reflux disease without esophagitis: Secondary | ICD-10-CM | POA: Diagnosis not present

## 2020-02-20 DIAGNOSIS — F17210 Nicotine dependence, cigarettes, uncomplicated: Secondary | ICD-10-CM | POA: Diagnosis not present

## 2020-02-20 DIAGNOSIS — M4302 Spondylolysis, cervical region: Secondary | ICD-10-CM | POA: Diagnosis not present

## 2020-02-21 ENCOUNTER — Telehealth: Payer: Self-pay

## 2020-02-21 DIAGNOSIS — Z597 Insufficient social insurance and welfare support: Secondary | ICD-10-CM | POA: Diagnosis not present

## 2020-02-21 DIAGNOSIS — Z7189 Other specified counseling: Secondary | ICD-10-CM | POA: Diagnosis not present

## 2020-02-21 NOTE — Telephone Encounter (Signed)
Left message on patients voicemail to please return our call.   

## 2020-02-21 NOTE — Telephone Encounter (Signed)
-----   Message from Berniece Salines, DO sent at 02/20/2020  6:54 PM EDT ----- Your upper extremity ultrasound is normal. I am looking forward to seeing on on 10/21.

## 2020-02-22 ENCOUNTER — Telehealth: Payer: Self-pay

## 2020-02-22 NOTE — Telephone Encounter (Signed)
-----   Message from Berniece Salines, DO sent at 02/20/2020  6:54 PM EDT ----- Your upper extremity ultrasound is normal. I am looking forward to seeing on on 10/21.

## 2020-02-22 NOTE — Telephone Encounter (Signed)
Left message on patients voicemail to please return our call.   

## 2020-02-25 ENCOUNTER — Telehealth: Payer: Self-pay

## 2020-02-25 NOTE — Telephone Encounter (Signed)
-----   Message from Berniece Salines, DO sent at 02/20/2020  6:54 PM EDT ----- Your upper extremity ultrasound is normal. I am looking forward to seeing on on 10/21.

## 2020-02-25 NOTE — Telephone Encounter (Signed)
Left message on patients voicemail to please return our call.   I will also go ahead and send a letter to the patient after trying to reach her with no success x3.

## 2020-02-27 DIAGNOSIS — K580 Irritable bowel syndrome with diarrhea: Secondary | ICD-10-CM | POA: Diagnosis not present

## 2020-02-27 DIAGNOSIS — I110 Hypertensive heart disease with heart failure: Secondary | ICD-10-CM | POA: Diagnosis not present

## 2020-02-27 DIAGNOSIS — M179 Osteoarthritis of knee, unspecified: Secondary | ICD-10-CM | POA: Diagnosis not present

## 2020-02-27 DIAGNOSIS — H547 Unspecified visual loss: Secondary | ICD-10-CM | POA: Diagnosis not present

## 2020-02-27 DIAGNOSIS — Z87440 Personal history of urinary (tract) infections: Secondary | ICD-10-CM | POA: Diagnosis not present

## 2020-02-27 DIAGNOSIS — Z85038 Personal history of other malignant neoplasm of large intestine: Secondary | ICD-10-CM | POA: Diagnosis not present

## 2020-02-27 DIAGNOSIS — I081 Rheumatic disorders of both mitral and tricuspid valves: Secondary | ICD-10-CM | POA: Diagnosis not present

## 2020-02-27 DIAGNOSIS — G510 Bell's palsy: Secondary | ICD-10-CM | POA: Diagnosis not present

## 2020-02-27 DIAGNOSIS — F17210 Nicotine dependence, cigarettes, uncomplicated: Secondary | ICD-10-CM | POA: Diagnosis not present

## 2020-02-27 DIAGNOSIS — M19012 Primary osteoarthritis, left shoulder: Secondary | ICD-10-CM | POA: Diagnosis not present

## 2020-02-27 DIAGNOSIS — E785 Hyperlipidemia, unspecified: Secondary | ICD-10-CM | POA: Diagnosis not present

## 2020-02-27 DIAGNOSIS — M5030 Other cervical disc degeneration, unspecified cervical region: Secondary | ICD-10-CM | POA: Diagnosis not present

## 2020-02-27 DIAGNOSIS — Z7982 Long term (current) use of aspirin: Secondary | ICD-10-CM | POA: Diagnosis not present

## 2020-02-27 DIAGNOSIS — K219 Gastro-esophageal reflux disease without esophagitis: Secondary | ICD-10-CM | POA: Diagnosis not present

## 2020-02-27 DIAGNOSIS — J449 Chronic obstructive pulmonary disease, unspecified: Secondary | ICD-10-CM | POA: Diagnosis not present

## 2020-02-27 DIAGNOSIS — E119 Type 2 diabetes mellitus without complications: Secondary | ICD-10-CM | POA: Diagnosis not present

## 2020-02-27 DIAGNOSIS — M4302 Spondylolysis, cervical region: Secondary | ICD-10-CM | POA: Diagnosis not present

## 2020-02-27 DIAGNOSIS — G43909 Migraine, unspecified, not intractable, without status migrainosus: Secondary | ICD-10-CM | POA: Diagnosis not present

## 2020-02-27 DIAGNOSIS — I700 Atherosclerosis of aorta: Secondary | ICD-10-CM | POA: Diagnosis not present

## 2020-02-27 DIAGNOSIS — E278 Other specified disorders of adrenal gland: Secondary | ICD-10-CM | POA: Diagnosis not present

## 2020-02-27 DIAGNOSIS — I5032 Chronic diastolic (congestive) heart failure: Secondary | ICD-10-CM | POA: Diagnosis not present

## 2020-02-27 DIAGNOSIS — Z9181 History of falling: Secondary | ICD-10-CM | POA: Diagnosis not present

## 2020-02-27 DIAGNOSIS — D649 Anemia, unspecified: Secondary | ICD-10-CM | POA: Diagnosis not present

## 2020-02-27 DIAGNOSIS — Z79891 Long term (current) use of opiate analgesic: Secondary | ICD-10-CM | POA: Diagnosis not present

## 2020-03-02 ENCOUNTER — Encounter (HOSPITAL_COMMUNITY): Payer: Self-pay | Admitting: Emergency Medicine

## 2020-03-02 ENCOUNTER — Emergency Department (HOSPITAL_COMMUNITY): Payer: Medicare Other

## 2020-03-02 ENCOUNTER — Inpatient Hospital Stay (HOSPITAL_COMMUNITY)
Admission: EM | Admit: 2020-03-02 | Discharge: 2020-03-06 | DRG: 312 | Disposition: A | Discharge status: Home Health | Payer: Medicare Other | Attending: Family Medicine | Admitting: Family Medicine

## 2020-03-02 ENCOUNTER — Other Ambulatory Visit: Payer: Self-pay

## 2020-03-02 DIAGNOSIS — H9319 Tinnitus, unspecified ear: Secondary | ICD-10-CM | POA: Diagnosis present

## 2020-03-02 DIAGNOSIS — R42 Dizziness and giddiness: Secondary | ICD-10-CM | POA: Diagnosis not present

## 2020-03-02 DIAGNOSIS — K219 Gastro-esophageal reflux disease without esophagitis: Secondary | ICD-10-CM | POA: Diagnosis not present

## 2020-03-02 DIAGNOSIS — R531 Weakness: Secondary | ICD-10-CM | POA: Diagnosis not present

## 2020-03-02 DIAGNOSIS — I25118 Atherosclerotic heart disease of native coronary artery with other forms of angina pectoris: Secondary | ICD-10-CM | POA: Diagnosis present

## 2020-03-02 DIAGNOSIS — R1013 Epigastric pain: Secondary | ICD-10-CM | POA: Diagnosis not present

## 2020-03-02 DIAGNOSIS — K580 Irritable bowel syndrome with diarrhea: Secondary | ICD-10-CM | POA: Diagnosis not present

## 2020-03-02 DIAGNOSIS — R1310 Dysphagia, unspecified: Secondary | ICD-10-CM | POA: Diagnosis present

## 2020-03-02 DIAGNOSIS — I951 Orthostatic hypotension: Secondary | ICD-10-CM | POA: Diagnosis not present

## 2020-03-02 DIAGNOSIS — I358 Other nonrheumatic aortic valve disorders: Secondary | ICD-10-CM | POA: Diagnosis not present

## 2020-03-02 DIAGNOSIS — R112 Nausea with vomiting, unspecified: Secondary | ICD-10-CM | POA: Diagnosis not present

## 2020-03-02 DIAGNOSIS — Z20822 Contact with and (suspected) exposure to covid-19: Secondary | ICD-10-CM | POA: Diagnosis present

## 2020-03-02 DIAGNOSIS — R260 Ataxic gait: Secondary | ICD-10-CM | POA: Diagnosis present

## 2020-03-02 DIAGNOSIS — I69354 Hemiplegia and hemiparesis following cerebral infarction affecting left non-dominant side: Secondary | ICD-10-CM

## 2020-03-02 DIAGNOSIS — Z96659 Presence of unspecified artificial knee joint: Secondary | ICD-10-CM | POA: Diagnosis present

## 2020-03-02 DIAGNOSIS — G510 Bell's palsy: Secondary | ICD-10-CM | POA: Diagnosis present

## 2020-03-02 DIAGNOSIS — M1990 Unspecified osteoarthritis, unspecified site: Secondary | ICD-10-CM | POA: Diagnosis present

## 2020-03-02 DIAGNOSIS — I2510 Atherosclerotic heart disease of native coronary artery without angina pectoris: Secondary | ICD-10-CM | POA: Diagnosis not present

## 2020-03-02 DIAGNOSIS — H02402 Unspecified ptosis of left eyelid: Secondary | ICD-10-CM | POA: Diagnosis present

## 2020-03-02 DIAGNOSIS — I110 Hypertensive heart disease with heart failure: Secondary | ICD-10-CM | POA: Diagnosis not present

## 2020-03-02 DIAGNOSIS — R9082 White matter disease, unspecified: Secondary | ICD-10-CM | POA: Diagnosis not present

## 2020-03-02 DIAGNOSIS — F17210 Nicotine dependence, cigarettes, uncomplicated: Secondary | ICD-10-CM | POA: Diagnosis present

## 2020-03-02 DIAGNOSIS — Z885 Allergy status to narcotic agent status: Secondary | ICD-10-CM

## 2020-03-02 DIAGNOSIS — F419 Anxiety disorder, unspecified: Secondary | ICD-10-CM | POA: Diagnosis not present

## 2020-03-02 DIAGNOSIS — J3489 Other specified disorders of nose and nasal sinuses: Secondary | ICD-10-CM | POA: Diagnosis not present

## 2020-03-02 DIAGNOSIS — I5030 Unspecified diastolic (congestive) heart failure: Secondary | ICD-10-CM | POA: Diagnosis present

## 2020-03-02 DIAGNOSIS — I471 Supraventricular tachycardia: Secondary | ICD-10-CM | POA: Diagnosis present

## 2020-03-02 DIAGNOSIS — J449 Chronic obstructive pulmonary disease, unspecified: Secondary | ICD-10-CM | POA: Diagnosis not present

## 2020-03-02 DIAGNOSIS — I959 Hypotension, unspecified: Secondary | ICD-10-CM

## 2020-03-02 DIAGNOSIS — M810 Age-related osteoporosis without current pathological fracture: Secondary | ICD-10-CM | POA: Diagnosis present

## 2020-03-02 DIAGNOSIS — R2681 Unsteadiness on feet: Secondary | ICD-10-CM

## 2020-03-02 DIAGNOSIS — E782 Mixed hyperlipidemia: Secondary | ICD-10-CM | POA: Diagnosis present

## 2020-03-02 DIAGNOSIS — I517 Cardiomegaly: Secondary | ICD-10-CM | POA: Diagnosis not present

## 2020-03-02 DIAGNOSIS — M25511 Pain in right shoulder: Secondary | ICD-10-CM | POA: Diagnosis present

## 2020-03-02 DIAGNOSIS — E119 Type 2 diabetes mellitus without complications: Secondary | ICD-10-CM | POA: Diagnosis present

## 2020-03-02 DIAGNOSIS — D649 Anemia, unspecified: Secondary | ICD-10-CM | POA: Diagnosis present

## 2020-03-02 DIAGNOSIS — Z79899 Other long term (current) drug therapy: Secondary | ICD-10-CM | POA: Diagnosis not present

## 2020-03-02 DIAGNOSIS — R079 Chest pain, unspecified: Secondary | ICD-10-CM | POA: Diagnosis not present

## 2020-03-02 DIAGNOSIS — Z7983 Long term (current) use of bisphosphonates: Secondary | ICD-10-CM

## 2020-03-02 DIAGNOSIS — R300 Dysuria: Secondary | ICD-10-CM | POA: Diagnosis not present

## 2020-03-02 DIAGNOSIS — Z85038 Personal history of other malignant neoplasm of large intestine: Secondary | ICD-10-CM

## 2020-03-02 DIAGNOSIS — F39 Unspecified mood [affective] disorder: Secondary | ICD-10-CM | POA: Diagnosis present

## 2020-03-02 DIAGNOSIS — Z9049 Acquired absence of other specified parts of digestive tract: Secondary | ICD-10-CM

## 2020-03-02 DIAGNOSIS — Z888 Allergy status to other drugs, medicaments and biological substances status: Secondary | ICD-10-CM

## 2020-03-02 DIAGNOSIS — Z7982 Long term (current) use of aspirin: Secondary | ICD-10-CM

## 2020-03-02 DIAGNOSIS — R072 Precordial pain: Secondary | ICD-10-CM | POA: Diagnosis present

## 2020-03-02 DIAGNOSIS — R519 Headache, unspecified: Secondary | ICD-10-CM | POA: Diagnosis not present

## 2020-03-02 LAB — CBC
HCT: 36.2 % (ref 36.0–46.0)
Hemoglobin: 11.7 g/dL — ABNORMAL LOW (ref 12.0–15.0)
MCH: 30.4 pg (ref 26.0–34.0)
MCHC: 32.3 g/dL (ref 30.0–36.0)
MCV: 94 fL (ref 80.0–100.0)
Platelets: 182 10*3/uL (ref 150–400)
RBC: 3.85 MIL/uL — ABNORMAL LOW (ref 3.87–5.11)
RDW: 14.4 % (ref 11.5–15.5)
WBC: 7.8 10*3/uL (ref 4.0–10.5)
nRBC: 0 % (ref 0.0–0.2)

## 2020-03-02 LAB — BASIC METABOLIC PANEL
Anion gap: 9 (ref 5–15)
BUN: 12 mg/dL (ref 8–23)
CO2: 24 mmol/L (ref 22–32)
Calcium: 8.9 mg/dL (ref 8.9–10.3)
Chloride: 106 mmol/L (ref 98–111)
Creatinine, Ser: 0.86 mg/dL (ref 0.44–1.00)
GFR, Estimated: >60 mL/min (ref >60)
Glucose, Bld: 116 mg/dL — ABNORMAL HIGH (ref 70–99)
Potassium: 4.3 mmol/L (ref 3.5–5.1)
Sodium: 139 mmol/L (ref 135–145)

## 2020-03-02 LAB — TROPONIN I (HIGH SENSITIVITY): Troponin I (High Sensitivity): 4 ng/L (ref <18)

## 2020-03-02 MED ORDER — DIPHENHYDRAMINE HCL 50 MG/ML IJ SOLN
12.5000 mg | Freq: Once | INTRAMUSCULAR | Status: AC
  Administered 2020-03-02: 12.5 mg via INTRAVENOUS
  Filled 2020-03-02: qty 1

## 2020-03-02 MED ORDER — PROCHLORPERAZINE EDISYLATE 10 MG/2ML IJ SOLN
5.0000 mg | Freq: Once | INTRAMUSCULAR | Status: AC
  Administered 2020-03-02: 5 mg via INTRAVENOUS
  Filled 2020-03-02: qty 2

## 2020-03-02 MED ORDER — LORAZEPAM 2 MG/ML IJ SOLN
0.5000 mg | INTRAMUSCULAR | Status: DC | PRN
  Administered 2020-03-02: 0.5 mg via INTRAVENOUS
  Filled 2020-03-02: qty 1

## 2020-03-02 MED ORDER — DIAZEPAM 5 MG PO TABS: 5.0000 mg | ORAL_TABLET | Freq: Once | ORAL | Status: DC

## 2020-03-02 NOTE — ED Triage Notes (Signed)
Pt reports substernal chest pain and nausea since this morning.  States she has had a "dizzy headache" x 2 weeks and seen at [facility] and diagnosed with vertigo.  Taking meds without relief.

## 2020-03-02 NOTE — H&P (Addendum)
[facility] Admission History and Physical Service Pager: [phone]  Patient name: Heather Mccoy Medical record number: [id number] Date of birth: 1945-12-07 Age: 74 y.o. Gender: female  Primary Care Provider: Lind Guest, NP Consultants: Neurology Code Status: FULL  Preferred Emergency Contact:  Daughter - Heather Mccoy (pt did not know her phone number)  Chief Complaint: Dizziness   Assessment and Plan: Heather Mccoy is a 74 y.o. female presenting with unsteadiness . PMH is significant for hypertension, diabetes, hyperlipidemia, coronary artery disease, normocytic anemia, vitamin B12 deficiency, mood disorder, orthostatic hypotension  Dizziness 2/2 orthostatic hypotension, bradycardia  Heather Mccoy is a 74 yr old female who presents with 3-week history of dizziness on standing which is progressively worsening. Per chart review, patient does have a history of orthostatic hypotension followed by cardiology (Heather [facility]cc[facility] with [facility]). Of note, patient was recently admitted to [facility], diagnosed with vertigo and prescribed meclizine, diazepam and zofran which have not helped alleviate symptoms. Most likely differential is orthostatic hypotension worsened by patient's antihypertensives, benzodiazepines and anticholinergics medications. Initially in the ED, there was concern for CVA given ataxic gait on exam and past pointing, so neurology were consulted. No evidence of CVA on imaging (CT head, MRI brain) and neurologist, felt symptoms were more in likely due to orthostatic hypotension rather than neurologic in etiology. Interestingly, she has midodrine and several antihypertensives on her medication list.  Electrolytes unremarkable. Could also consider symptomatic bradycardia secondary to beta blockers as patient is on Coreg 12.mg BID.  Also considered heart block as differential however no evidence of heart block seen on EKG.  We will plan to  give 500 cc fluid bolus and hold home medications (especially antihypertensives), case discussed with cardiology and they are in agreement with plan. Will await formal med rec in the meantime and consider formal consult in the morning. -Admit to [facility], medical telemetry, [facility] -NPO -Up with assistance -Hold all medications  -Cardiology consult am -Further 250 cc NS bolus -Orthostatic blood pressures  -F/u Echo -EKG -PT/OT eval -SLP  Dysphagia Patient reporting dysphagia to solids and liquids for 1 day.  Notably, patient failed the stroke swallow screen in the ED.  No evidence of CVA on imaging.  Dysphagia can be due to various etiologies, will have SLP evaluate and further assess. -NPO -SLP eval -Consider barium swallow Vs EGD to investigate cause of dysphagia   CVA Reported hx of stroke in May 2021 however no evidence on chart review. Home meds: ASA 81mg  -Hold ASA as NPO   Hypertension, hypotension SBP 108-139, DBP 48-60 It appears patient takes amlodipine 2.5 mg daily, carvedilol 12.5 mg twice daily, hydralazine 25 mg twice daily, midodrine 5mg  TID.  Will await formal med rec.  Holding antihypertensives in the setting of likely orthostatic hypotension. -Holding home meds as NPO   Type 2 diabetes No recent A1c on file. No antihyperglycemics on her medication list. Will continue to monitor for now. -Monitor CBGs  -F/u A1c -sSSI if CBG>180   Hyperlipidemia It appears she takes atorvastatin 40 mg daily. Awaiting formal med rec. -F/u lipid panel -Hold statin as NPO   Coronary artery disease, paroxysmal atrial tachycardia Denies chest pain at time of exam but did endorse chest pain with ED provider. Patient with chronic exertional chest pain due to stable angina, which is followed by cardiology, Heather [facility]ccoy.  Exertional dyspnea likely also due to stable angina. CAD diagnosed on CCTA. At home, takes a long-acting antianginal (ranolazine) 1000  mg, ASA 81 mg, carvedilol. Low  suspicion for ACS on admission.  Troponin 3>4, EKG without ischemic changes -Monitor K and Mg -Hold ranolazine, ASA and carvedilol as NPO  -Cardiology consult am  HFpEF Echo in March 2021 showed normal left ventricular systolic function, EF 55 to 34%, grade 1 diastolic dysfunction.Left atrium is severely dilated. Right atrium is mildly enlarged. On exam no signs of fluid overload. Pt has been known to take lasix in the past. BNP on admission 106 -Is and Os  Bells palsy  Noted to have Bell palsy for the last 3 months. One exam obvious ptosis of left eyelid, reduced facial movement of left sided face -Monitor sx   Normocytic anemia Hb 11.7 on presentation. MCV 94. Appears to be chronic, home meds include Ferosul 3255 mg daily -Monitor on CBC - transfusion threshold 8 given CAD  -Hold ferosul as NPO   Dysuria Dysuria and urinary frequency for the past 3 weeks per patient.  UA unremarkable, does show rare bacteria do not suspect urinary tract infection at this time.   -We will continue to monitor symptoms   Mood disorder Home meds: Celexa 10 mg -Hold Celexa as n.p.o.  COPD Home meds include: Singulair 10 mg at bedtime -Hold Singulair as n.p.o. -Encourage smoking cessation -Nicotine patch PRN   GERD Denies reflux or epigastric pain.  Home meds include famotidine 40 mg daily, Protonix 40 mg daily -Hold famotidine and Protonix as n.p.o.   Osteoporosis Home meds include alendronate 70 mg once a week -Hold alendronate as n.p.o.  Irritable bowel syndrome with diarrhea Home meds include Lomotil, Bentyl -Hold Lomotil and Bentyl as n.p.o. and due to potential side effect of dizziness.  H/o colon cancer 2012,  s/p surgical resection   FEN/GI: NPO  Prophylaxis: Lovenox   Disposition: med tele   History of Present Illness:  Heather Mccoy is a 74 y.o. female presenting with dizziness.  Patient reports dizziness that started 3 months ago, but worsened today.  Dizziness  described as lightheadedness when standing up, improves when laying down.  Patient was diagnosed with vertigo at [facility] and was prescribed meclizine and diazepam, which have not helped with her symptoms. Denies any recent medication changes in the past few weeks.  Patient also reporting dysphagia to food and liquids starting today. She states she did have some dysphagia when she had a stroke in May of this year.   Also reporting hearing loss with tinnitus (described as "bells ringing in her ears") from her left ear starting 3 months ago, which she states is due to her Bell's palsy.   Patient has had chronic chest pain with exertion. Also reports dyspnea for the past week also worse with exertion. Denies orthopnea, uses 1 pillow at night.  Has chronic abdominal pain and diarrhea which is constant ever since he had an abdominal surgery for colon cancer.  Reporting dysuria and urinary frequency for the past 3 weeks.  Residual left sided limb weakness since stroke in May of this year.   Has not received covid vaccine as PCP said immune system was low. Denies EOTH intake. Smokes 1/2 pack a day. Smoked for 12 years. Denies illicit drug use.   Lives alone, walks with walker and cane. Home health 1-2 times a week. Has one daughter in [place](?).   In the ED: vitals: BP 108/94, sats 93%, RR 10, HR 61. Labs: Hb 11.7, WBC 7.8, CMP within normal limits except mildly elevated glucose at 116.  CT head  negative.  MRI brain no acute intracranial abnormality, stable MRI appearance for age.  Recommended follow-up head MRI with and without contrast.  S/p Benadryl, Compazine and Ativan in the ED.   Review Of Systems: Per HPI with the following additions:   Review of Systems  Constitutional: Negative for fever.  Respiratory: Positive for chest tightness and shortness of breath. Negative for cough.   Cardiovascular: Positive for chest pain and palpitations.  Gastrointestinal: Positive for  abdominal pain (chronic) and diarrhea (chronic). Negative for blood in stool, nausea and vomiting.  Genitourinary: Positive for dysuria and frequency.  Neurological: Positive for dizziness, light-headedness and headaches (chronic). Negative for seizures.     Patient Active Problem List   Diagnosis Date Noted  . Orthostatic dizziness 03/03/2020  . Anxiety   . Arthritis   . Cancer ([place])   . COPD (chronic obstructive pulmonary disease) ([place])   . Dysrhythmia   . Hyperlipidemia   . Mood disorder ([place])   . Orthostatic hypotension   . Shortness of breath   . Dizziness 09/18/2019  . Bell's palsy 07/31/2019  . Mixed hyperlipidemia 06/11/2019  . Essential hypertension 06/11/2019  . Coronary artery disease of native artery of native heart with stable angina pectoris ([place]) 06/11/2019  . Paroxysmal atrial tachycardia ([place]) 06/11/2019  . Hypertension 01/19/2019  . Hyperlipemia 01/19/2019  . Type II diabetes mellitus ([place]) 01/19/2019  . Vitamin B12 deficiency 09/13/2014  . Normocytic anemia 09/12/2014  . Weight loss 09/12/2014  . Chronic diarrhea of unknown origin 01/12/2012    Past Medical History: Past Medical History:  Diagnosis Date  . Anxiety   . Arthritis   . Bell's palsy 07/31/2019  . Cancer ([place])    colon  . Chronic diarrhea of unknown origin 01/12/2012  . COPD (chronic obstructive pulmonary disease) ([place])   . Coronary artery disease of native artery of native heart with stable angina pectoris ([place]) 06/11/2019  . Diabetes mellitus   . Dizziness 09/18/2019  . Dysrhythmia   . Essential hypertension 06/11/2019  . Hyperlipemia 01/19/2019  . Hyperlipidemia   . Hypertension   . Mixed hyperlipidemia 06/11/2019  . Mood disorder ([place])   . Normocytic anemia 09/12/2014  . Orthostatic hypotension   . Paroxysmal atrial tachycardia ([place]) 06/11/2019  . Shortness of breath   . Type II diabetes mellitus ([place]) 01/19/2019  . Vitamin B12 deficiency 09/13/2014  . Weight loss 09/12/2014    Past  Surgical History: Past Surgical History:  Procedure Laterality Date  . APPENDECTOMY    . BACK SURGERY    . CHOLECYSTECTOMY    . COLON SURGERY    . EUS  01/12/2012   Procedure: UPPER ENDOSCOPIC ULTRASOUND (EUS) RADIAL;  Surgeon: Arta Silence, MD;  Location: [facility] ENDOSCOPY;  Service: Endoscopy;  Laterality: N/A;  to be admitted after  . HAND SURGERY    . REPLACEMENT TOTAL KNEE      Social History: Social History   Tobacco Use  . Smoking status: Current Every Day Smoker    Packs/day: 0.50    Types: Cigarettes  . Smokeless tobacco: Never Used  Vaping Use  . Vaping Use: Never used  Substance Use Topics  . Alcohol use: No  . Drug use: No   Additional social history: lives alone. Smokes 0.5 ppd, started smoking 12 years ago.  Please also refer to relevant sections of EMR.  Family History: Family History  Problem Relation Age of Onset  . Cerebral aneurysm Mother   . CVA Father   . Heart attack  Father   . Heart attack Sister     Allergies and Medications: Allergies  Allergen Reactions  . Codeine Hives  . Morphine Dermatitis  . Nitroglycerin Nausea And Vomiting  . Tylenol [Acetaminophen]   . Oxycodone Rash    Can take with benadryl.   No current facility-administered medications on file prior to encounter.   Current Outpatient Medications on File Prior to Encounter  Medication Sig Dispense Refill  . alendronate (FOSAMAX) 70 MG tablet Take 70 mg by mouth once a week. Take with a full glass of water on an empty stomach.    Marland Kitchen amLODipine (NORVASC) 2.5 MG tablet Take 2.5 mg by mouth daily.    Marland Kitchen aspirin EC 81 MG tablet Take 81 mg by mouth daily.    Marland Kitchen atorvastatin (LIPITOR) 40 MG tablet Take 40 mg by mouth daily.    . carvedilol (COREG) 12.5 MG tablet Take 12.5 mg by mouth 2 (two) times daily.    . citalopram (CELEXA) 10 MG tablet Take 10 mg by mouth daily.    . diazepam (VALIUM) 5 MG tablet Take 5 mg by mouth 2 (two) times daily.    Marland Kitchen dicyclomine (BENTYL) 20 MG tablet Take  20 mg by mouth 3 (three) times daily.    . diphenoxylate-atropine (LOMOTIL) 2.5-0.025 MG tablet Take 2 tablets by mouth 4 (four) times daily.    . famotidine (PEPCID) 40 MG tablet Take 1 tablet by mouth daily.    . FEROSUL 325 (65 Fe) MG tablet Take 325 mg by mouth daily.    . hydrALAZINE (APRESOLINE) 25 MG tablet Take 1 tablet (25 mg total) by mouth 2 (two) times daily. 270 tablet 3  . hydrOXYzine (ATARAX/VISTARIL) 25 MG tablet Take 25 mg by mouth 3 (three) times daily as needed.    . loperamide (IMODIUM) 2 MG capsule Take 2 mg by mouth every 6 (six) hours as needed for diarrhea or loose stools.    Marland Kitchen MAGNESIUM-OXIDE 400 (241.3 Mg) MG tablet Take 1 tablet by mouth 2 (two) times daily.    . meclizine (ANTIVERT) 25 MG tablet Take 25 mg by mouth every 6 (six) hours as needed for dizziness.    . midodrine (PROAMATINE) 5 MG tablet Take 1 tablet (5 mg total) by mouth 3 (three) times daily with meals. 90 tablet 3  . montelukast (SINGULAIR) 10 MG tablet TAKE 1 TABLET BY MOUTH AT BEDTIME 90 tablet 3  . ondansetron (ZOFRAN-ODT) 4 MG disintegrating tablet Take 4 mg by mouth every 4 (four) hours as needed.    . pantoprazole (PROTONIX) 40 MG tablet Take 40 mg by mouth daily.    . ranolazine (RANEXA) 1000 MG SR tablet Take 1 tablet (1,000 mg total) by mouth 2 (two) times daily. 180 tablet 3    Objective: BP (!) 120/48   Pulse (!) 51   Temp 98.4 F (36.9 C)   Resp 17   Ht 5\' 2"  (1.575 m)   Wt 61.6 kg   SpO2 92%   BMI 24.84 kg/m  Exam: General: Thin elderly woman, laying comfortably in bed, NAD Eyes: PERRL, EOMI ENTM: MM dry, no lymphadenopathy, thyromegaly, masses Neck: supple Cardiovascular: Bradycardic, regular rhythm, no murmurs Respiratory: CTAB Gastrointestinal: soft, mild RUQ tenderness, +BS MSK: moves all extremities Derm: no rash Neuro: A&Ox3, 5/5 strength bilateral upper and lower extremities, normal heel to shin bilaterally, ptosis of left eye lid, reduced facial movement of left side  of face. No past pointing.  Psych: affect appropriate  Labs and  Imaging: CBC BMET  Recent Labs  Lab 03/02/20 1711  WBC 7.8  HGB 11.7*  HCT 36.2  PLT 182   Recent Labs  Lab 03/02/20 1711  NA 139  K 4.3  CL 106  CO2 24  BUN 12  CREATININE 0.86  GLUCOSE 116*  CALCIUM 8.9     EKG: My own interpretation (not copied from electronic read) sinus bradycardia    Zola Button, MD 03/03/2020, 2:34 AM PGY-1, [place] Intern pager: [phone], text pages welcome  [facility] Upper-Level Resident Addendum   I have independently interviewed and examined the patient. I have discussed the above with the original author and agree with their documentation. My edits for correction/addition/clarification are in blue. Please see also any attending notes.   Lattie Haw MD PGY-2, [place] [facility] 03/03/2020 2:43 AM  [facility] Service pager: [phone] (text pages welcome through [facility])

## 2020-03-02 NOTE — ED Provider Notes (Signed)
[facility]PART[facility] Provider Note   CSN: [id number] Arrival date & time: 03/02/20  1649     History Chief Complaint  Patient presents with  . Chest Pain    Heather Mccoy is a 74 y.o. female.  74 yo F with a chief complaints of dizziness.  The patient feels like she is unsteady.  This occurs especially when she is ambulating.  Has been going on for about 2 to 3 weeks now.  Was seen at an outside hospital and diagnosed with vertigo.  Was prescribed meclizine.  Has not had any improvement of her symptoms.  She has had some difficulty swallowing over the past few days.  Feels weak diffusely across her body.  Has had chronic chest pain that is mildly improved after increasing her long-acting nitrates.  Has had chronic headaches that are unchanged.  Typically left-sided and worse with bright lights or loud noises.  [place] about 3 weeks ago but denies any recent falls.  Denies head injury.  The history is provided by the patient.  Chest Pain Associated symptoms: dizziness and headache   Associated symptoms: no fever, no nausea, no palpitations, no shortness of breath and no vomiting   Illness Severity:  Moderate Onset quality:  Gradual Duration:  3 weeks Timing:  Constant Progression:  Worsening Chronicity:  New Associated symptoms: chest pain and headaches   Associated symptoms: no congestion, no fever, no myalgias, no nausea, no rhinorrhea, no shortness of breath, no vomiting and no wheezing        Past Medical History:  Diagnosis Date  . Anxiety   . Arthritis   . Bell's palsy 07/31/2019  . Cancer ([place])    colon  . Chronic diarrhea of unknown origin 01/12/2012  . COPD (chronic obstructive pulmonary disease) ([place])   . Coronary artery disease of native artery of native heart with stable angina pectoris ([place]) 06/11/2019  . Diabetes mellitus   . Dizziness 09/18/2019  . Dysrhythmia   . Essential hypertension 06/11/2019  . Hyperlipemia 01/19/2019  .  Hyperlipidemia   . Hypertension   . Mixed hyperlipidemia 06/11/2019  . Mood disorder ([place])   . Normocytic anemia 09/12/2014  . Orthostatic hypotension   . Paroxysmal atrial tachycardia ([place]) 06/11/2019  . Shortness of breath   . Type II diabetes mellitus ([place]) 01/19/2019  . Vitamin B12 deficiency 09/13/2014  . Weight loss 09/12/2014    Patient Active Problem List   Diagnosis Date Noted  . Anxiety   . Arthritis   . Cancer ([place])   . COPD (chronic obstructive pulmonary disease) ([place])   . Dysrhythmia   . Hyperlipidemia   . Mood disorder ([place])   . Orthostatic hypotension   . Shortness of breath   . Dizziness 09/18/2019  . Bell's palsy 07/31/2019  . Mixed hyperlipidemia 06/11/2019  . Essential hypertension 06/11/2019  . Coronary artery disease of native artery of native heart with stable angina pectoris ([place]) 06/11/2019  . Paroxysmal atrial tachycardia ([place]) 06/11/2019  . Hypertension 01/19/2019  . Hyperlipemia 01/19/2019  . Type II diabetes mellitus ([place]) 01/19/2019  . Vitamin B12 deficiency 09/13/2014  . Normocytic anemia 09/12/2014  . Weight loss 09/12/2014  . Chronic diarrhea of unknown origin 01/12/2012    Past Surgical History:  Procedure Laterality Date  . APPENDECTOMY    . BACK SURGERY    . CHOLECYSTECTOMY    . COLON SURGERY    . EUS  01/12/2012   Procedure: UPPER ENDOSCOPIC ULTRASOUND (EUS) RADIAL;  Surgeon: Arta Silence, MD;  Location: [facility];  Service: Endoscopy;  Laterality: N/A;  to be admitted after  . HAND SURGERY    . REPLACEMENT TOTAL KNEE       OB History   No obstetric history on file.     Family History  Problem Relation Age of Onset  . Cerebral aneurysm Mother   . CVA Father   . Heart attack Father   . Heart attack Sister     Social History   Tobacco Use  . Smoking status: Current Every Day Smoker    Packs/day: 0.50    Types: Cigarettes  . Smokeless tobacco: Never Used  Vaping Use  . Vaping Use: Never used  Substance Use Topics    . Alcohol use: No  . Drug use: No    Home Medications Prior to Admission medications   Medication Sig Start Date End Date Taking? Authorizing Provider  alendronate (FOSAMAX) 70 MG tablet Take 70 mg by mouth once a week. Take with a full glass of water on an empty stomach.    [provider]  amLODipine (NORVASC) 2.5 MG tablet Take 2.5 mg by mouth daily. 10/17/19   [provider]  aspirin EC 81 MG tablet Take 81 mg by mouth daily.    [provider]  atorvastatin (LIPITOR) 40 MG tablet Take 40 mg by mouth daily.    [provider]  carvedilol (COREG) 12.5 MG tablet Take 12.5 mg by mouth 2 (two) times daily. 06/11/19   [provider]  citalopram (CELEXA) 10 MG tablet Take 10 mg by mouth daily.    [provider]  diazepam (VALIUM) 5 MG tablet Take 5 mg by mouth 2 (two) times daily. 02/01/20   [provider]  dicyclomine (BENTYL) 20 MG tablet Take 20 mg by mouth 3 (three) times daily. 01/19/20   [provider]  diphenoxylate-atropine (LOMOTIL) 2.5-0.025 MG tablet Take 2 tablets by mouth 4 (four) times daily. 11/29/19   [provider]  famotidine (PEPCID) 40 MG tablet Take 1 tablet by mouth daily. 08/22/19   [provider]  FEROSUL 325 (65 Fe) MG tablet Take 325 mg by mouth daily. 12/27/19   [provider]  hydrALAZINE (APRESOLINE) 25 MG tablet Take 1 tablet (25 mg total) by mouth 2 (two) times daily. 09/18/19 12/17/19  Tobb, Godfrey Pick, DO  hydrOXYzine (ATARAX/VISTARIL) 25 MG tablet Take 25 mg by mouth 3 (three) times daily as needed.    [provider]  loperamide (IMODIUM) 2 MG capsule Take 2 mg by mouth every 6 (six) hours as needed for diarrhea or loose stools.    [provider]  MAGNESIUM-OXIDE 400 (241.3 Mg) MG tablet Take 1 tablet by mouth 2 (two) times daily. 01/28/20   [provider]  meclizine (ANTIVERT) 25 MG tablet Take 25 mg by mouth every 6 (six) hours as needed for  dizziness.    [provider]  midodrine (PROAMATINE) 5 MG tablet Take 1 tablet (5 mg total) by mouth 3 (three) times daily with meals. 02/14/20   Tobb, Kardie, DO  montelukast (SINGULAIR) 10 MG tablet TAKE 1 TABLET BY MOUTH AT BEDTIME 07/16/19   Cox, Kirsten, MD  ondansetron (ZOFRAN-ODT) 4 MG disintegrating tablet Take 4 mg by mouth every 4 (four) hours as needed. 02/03/20   [provider]  pantoprazole (PROTONIX) 40 MG tablet Take 40 mg by mouth daily.    [provider]  ranolazine (RANEXA) 1000 MG SR tablet Take  1 tablet (1,000 mg total) by mouth 2 (two) times daily. 01/17/20   Tobb, Kardie, DO    Allergies    Codeine, Morphine, Nitroglycerin, Tylenol [acetaminophen], and Oxycodone  Review of Systems   Review of Systems  Constitutional: Negative for chills and fever.  HENT: Negative for congestion and rhinorrhea.   Eyes: Negative for redness and visual disturbance.  Respiratory: Negative for shortness of breath and wheezing.   Cardiovascular: Positive for chest pain. Negative for palpitations.  Gastrointestinal: Negative for nausea and vomiting.  Genitourinary: Negative for dysuria and urgency.  Musculoskeletal: Negative for arthralgias and myalgias.  Skin: Negative for pallor and wound.  Neurological: Positive for dizziness and headaches.    Physical Exam Updated Vital Signs BP 125/69 (BP Location: Right Arm)   Pulse (!) 55   Temp 98.4 F (36.9 C)   Resp 19   Ht 5\' 2"  (1.575 m)   Wt 61.6 kg   SpO2 94%   BMI 24.84 kg/m   Physical Exam Vitals and nursing note reviewed.  Constitutional:      General: She is not in acute distress.    Appearance: She is well-developed. She is not diaphoretic.  HENT:     Head: Normocephalic and atraumatic.  Eyes:     Pupils: Pupils are equal, round, and reactive to light.  Cardiovascular:     Rate and Rhythm: Normal rate and regular rhythm.     Heart sounds: No murmur heard.  No friction rub. No gallop.     Pulmonary:     Effort: Pulmonary effort is normal.     Breath sounds: No wheezing or rales.  Abdominal:     General: There is no distension.     Palpations: Abdomen is soft.     Tenderness: There is no abdominal tenderness.  Musculoskeletal:        General: No tenderness.     Cervical back: Normal range of motion and neck supple.  Skin:    General: Skin is warm and dry.  Neurological:     Mental Status: She is alert and oriented to person, place, and time.     Cranial Nerves: Cranial nerves are intact.     Sensory: Sensation is intact.     Motor: Motor function is intact.     Comments: Misses my finger with both hands and comes back to my finger. No wavering.  Ataxic gait.   Psychiatric:        Behavior: Behavior normal.     ED Results / Procedures / Treatments   Labs (all labs ordered are listed, but only abnormal results are displayed) Labs Reviewed  BASIC METABOLIC PANEL - Abnormal; Notable for the following components:      Result Value   Glucose, Bld 116 (*)    All other components within normal limits  CBC - Abnormal; Notable for the following components:   RBC 3.85 (*)    Hemoglobin 11.7 (*)    All other components within normal limits  TROPONIN I (HIGH SENSITIVITY)  TROPONIN I (HIGH SENSITIVITY)    EKG EKG Interpretation  Date/Time:  Sunday March 02 2020 17:52:16 EDT Ventricular Rate:  54 PR Interval:  146 QRS Duration: 89 QT Interval:  499 QTC Calculation: 473 R Axis:   -54 Text Interpretation: Sinus rhythm Anterolateral infarct, old No significant change since last tracing Confirmed by Deno Etienne [phone]) on 03/02/2020 9:53:38 PM   Radiology DG Chest 2 View  Result Date: 03/02/2020 CLINICAL DATA:  Chest pain EXAM:  CHEST - 2 VIEW COMPARISON:  02/01/2020 FINDINGS: Mild cardiomegaly. Lingular atelectasis, scarring or infiltrate. Right lung clear. No effusions or acute bony abnormality. IMPRESSION: Cardiomegaly. Lingular density could reflect  atelectasis, scarring or less likely infiltrate. Electronically Signed   By: Rolm Baptise M.D.   On: 03/02/2020 17:35   CT Head Wo Contrast  Result Date: 03/02/2020 CLINICAL DATA:  Dizziness, headache EXAM: CT HEAD WITHOUT CONTRAST TECHNIQUE: Contiguous axial images were obtained from the base of the skull through the vertex without intravenous contrast. COMPARISON:  None. FINDINGS: Brain: No acute intracranial abnormality. Specifically, no hemorrhage, hydrocephalus, mass lesion, acute infarction, or significant intracranial injury. Vascular: No hyperdense vessel or unexpected calcification. Skull: No acute calvarial abnormality. Sinuses/Orbits: Visualized paranasal sinuses and mastoids clear. Orbital soft tissues unremarkable. Other: None IMPRESSION: Normal study. Electronically Signed   By: Rolm Baptise M.D.   On: 03/02/2020 18:46   MR BRAIN WO CONTRAST  Result Date: 03/02/2020 CLINICAL DATA:  74 year old female with dizziness, headache, neurologic deficit. EXAM: MRI HEAD WITHOUT CONTRAST TECHNIQUE: Multiplanar, multiecho pulse sequences of the brain and surrounding structures were obtained without intravenous contrast. COMPARISON:  Head CT earlier tonight. [facility] 02/01/2020 and earlier. FINDINGS: Brain: Stable cerebral volume on MRIs this year. No restricted diffusion to suggest acute infarction. No midline shift, mass effect, evidence of mass lesion, ventriculomegaly, extra-axial collection or acute intracranial hemorrhage. Cervicomedullary junction and pituitary are within normal limits. Mild for age periatrial and occasionally other scattered white matter T2 and FLAIR hyperintensity appears stable since March. No superimposed cortical encephalomalacia, chronic cerebral blood products, or signal changes in the deep gray nuclei, brainstem, or cerebellum. Vascular: Major intracranial vascular flow voids are stable. Skull and upper cervical spine: Negative for age visible cervical  spine. Bone marrow signal is stable and within normal limits. Sinuses/Orbits: Stable postoperative changes to the orbits. Mild paranasal sinus mucosal thickening is stable. Other: Mastoids remain well pneumatized. Grossly normal visible internal auditory structures, with no dedicated internal auditory imaging across this series of exams. Scalp and face soft tissues appear negative. IMPRESSION: 1.  No acute intracranial abnormality. 2. Stable MRI appearance of the brain this year with mild for age nonspecific white matter signal changes. In light of persistent dizziness, routine follow-up Head MRI without and with contrast utilizing dedicated internal auditory canal (IAC) protocol might be valuable. Electronically Signed   By: Genevie Ann M.D.   On: 03/02/2020 22:27    Procedures Procedures (including critical care time)  Medications Ordered in ED Medications  LORazepam (ATIVAN) injection 0.5 mg (0.5 mg Intravenous Given 03/02/20 2124)  prochlorperazine (COMPAZINE) injection 5 mg (5 mg Intravenous Given 03/02/20 2247)  diphenhydrAMINE (BENADRYL) injection 12.5 mg (12.5 mg Intravenous Given 03/02/20 2246)    ED Course  I have reviewed the triage vital signs and the nursing notes.  Pertinent labs & imaging results that were available during my care of the patient were reviewed by me and considered in my medical decision making (see chart for details).    MDM Rules/Calculators/A&P                          74 yo F with a chief complaints of a feeling of unsteadiness when she walks.  Going on for about 2 to 3 weeks now.  Was seen in outside hospital and told that she had vertigo.  She has been using a walker at home and able to make do but she is concerned  that her symptoms have persisted.  Ataxic gait for me on exam.  No wavering with finger-to-nose but she misses my finger consistently.  CT the head without obvious intracranial pathology.  Will discuss with neurology.  I discussed case with Dr.  Leonel Ramsay.  Recommending an MRI of the brain.  Patient did follow her swallow study.  MRI is negative.  Patient's feeling a bit nauseated and still with a left frontal headache.  Discussed the Dr. Leonel Ramsay who come evaluate at bedside.  Will discuss case with medicine for admission.  The patients results and plan were reviewed and discussed.   Any x-rays performed were independently reviewed by myself.   Differential diagnosis were considered with the presenting HPI.  Medications  LORazepam (ATIVAN) injection 0.5 mg (0.5 mg Intravenous Given 03/02/20 2124)  prochlorperazine (COMPAZINE) injection 5 mg (5 mg Intravenous Given 03/02/20 2247)  diphenhydrAMINE (BENADRYL) injection 12.5 mg (12.5 mg Intravenous Given 03/02/20 2246)    Vitals:   03/02/20 1850 03/02/20 2115 03/02/20 2120 03/02/20 2125  BP:      Pulse: (!) 51 (!) 52 (!) 55 (!) 55  Resp: 18 17 19 19   Temp:      TempSrc:      SpO2: 94% 92% 93% 94%  Weight:      Height:        Final diagnoses:  Unsteady gait    Admission/ observation were discussed with the admitting physician, patient and/or family and they are comfortable with the plan.   Final Clinical Impression(s) / ED Diagnoses Final diagnoses:  Unsteady gait    Rx / DC Orders ED Discharge Orders    None       Deno Etienne, DO 03/02/20 2318

## 2020-03-02 NOTE — ED Notes (Signed)
Attempted IV x 4 no success, IV team paged.

## 2020-03-02 NOTE — ED Notes (Signed)
Patient transported to MRI 

## 2020-03-03 ENCOUNTER — Inpatient Hospital Stay (HOSPITAL_COMMUNITY): Payer: Medicare Other

## 2020-03-03 DIAGNOSIS — E782 Mixed hyperlipidemia: Secondary | ICD-10-CM | POA: Diagnosis present

## 2020-03-03 DIAGNOSIS — M1990 Unspecified osteoarthritis, unspecified site: Secondary | ICD-10-CM | POA: Diagnosis present

## 2020-03-03 DIAGNOSIS — I2510 Atherosclerotic heart disease of native coronary artery without angina pectoris: Secondary | ICD-10-CM

## 2020-03-03 DIAGNOSIS — F419 Anxiety disorder, unspecified: Secondary | ICD-10-CM | POA: Diagnosis present

## 2020-03-03 DIAGNOSIS — I951 Orthostatic hypotension: Principal | ICD-10-CM

## 2020-03-03 DIAGNOSIS — R300 Dysuria: Secondary | ICD-10-CM | POA: Diagnosis present

## 2020-03-03 DIAGNOSIS — F39 Unspecified mood [affective] disorder: Secondary | ICD-10-CM | POA: Diagnosis present

## 2020-03-03 DIAGNOSIS — I471 Supraventricular tachycardia: Secondary | ICD-10-CM

## 2020-03-03 DIAGNOSIS — G510 Bell's palsy: Secondary | ICD-10-CM | POA: Diagnosis present

## 2020-03-03 DIAGNOSIS — I25118 Atherosclerotic heart disease of native coronary artery with other forms of angina pectoris: Secondary | ICD-10-CM | POA: Diagnosis present

## 2020-03-03 DIAGNOSIS — R531 Weakness: Secondary | ICD-10-CM | POA: Diagnosis not present

## 2020-03-03 DIAGNOSIS — R42 Dizziness and giddiness: Secondary | ICD-10-CM | POA: Diagnosis not present

## 2020-03-03 DIAGNOSIS — Z79899 Other long term (current) drug therapy: Secondary | ICD-10-CM | POA: Diagnosis not present

## 2020-03-03 DIAGNOSIS — K580 Irritable bowel syndrome with diarrhea: Secondary | ICD-10-CM | POA: Diagnosis present

## 2020-03-03 DIAGNOSIS — Z20822 Contact with and (suspected) exposure to covid-19: Secondary | ICD-10-CM | POA: Diagnosis present

## 2020-03-03 DIAGNOSIS — I358 Other nonrheumatic aortic valve disorders: Secondary | ICD-10-CM | POA: Diagnosis not present

## 2020-03-03 DIAGNOSIS — I69354 Hemiplegia and hemiparesis following cerebral infarction affecting left non-dominant side: Secondary | ICD-10-CM | POA: Diagnosis not present

## 2020-03-03 DIAGNOSIS — F17210 Nicotine dependence, cigarettes, uncomplicated: Secondary | ICD-10-CM | POA: Diagnosis present

## 2020-03-03 DIAGNOSIS — M810 Age-related osteoporosis without current pathological fracture: Secondary | ICD-10-CM | POA: Diagnosis present

## 2020-03-03 DIAGNOSIS — R112 Nausea with vomiting, unspecified: Secondary | ICD-10-CM | POA: Diagnosis not present

## 2020-03-03 DIAGNOSIS — I959 Hypotension, unspecified: Secondary | ICD-10-CM | POA: Insufficient documentation

## 2020-03-03 DIAGNOSIS — I5030 Unspecified diastolic (congestive) heart failure: Secondary | ICD-10-CM | POA: Diagnosis not present

## 2020-03-03 DIAGNOSIS — R2681 Unsteadiness on feet: Secondary | ICD-10-CM | POA: Insufficient documentation

## 2020-03-03 DIAGNOSIS — K219 Gastro-esophageal reflux disease without esophagitis: Secondary | ICD-10-CM | POA: Diagnosis present

## 2020-03-03 DIAGNOSIS — R1310 Dysphagia, unspecified: Secondary | ICD-10-CM | POA: Diagnosis present

## 2020-03-03 DIAGNOSIS — D649 Anemia, unspecified: Secondary | ICD-10-CM | POA: Diagnosis present

## 2020-03-03 DIAGNOSIS — R260 Ataxic gait: Secondary | ICD-10-CM | POA: Diagnosis present

## 2020-03-03 DIAGNOSIS — E119 Type 2 diabetes mellitus without complications: Secondary | ICD-10-CM | POA: Diagnosis present

## 2020-03-03 DIAGNOSIS — Z96659 Presence of unspecified artificial knee joint: Secondary | ICD-10-CM | POA: Diagnosis present

## 2020-03-03 DIAGNOSIS — H02402 Unspecified ptosis of left eyelid: Secondary | ICD-10-CM | POA: Diagnosis present

## 2020-03-03 DIAGNOSIS — J449 Chronic obstructive pulmonary disease, unspecified: Secondary | ICD-10-CM | POA: Diagnosis present

## 2020-03-03 DIAGNOSIS — R1013 Epigastric pain: Secondary | ICD-10-CM | POA: Diagnosis not present

## 2020-03-03 DIAGNOSIS — I110 Hypertensive heart disease with heart failure: Secondary | ICD-10-CM | POA: Diagnosis present

## 2020-03-03 HISTORY — DX: Dizziness and giddiness: R42

## 2020-03-03 LAB — URINALYSIS, COMPLETE (UACMP) WITH MICROSCOPIC
Bilirubin Urine: NEGATIVE
Glucose, UA: NEGATIVE mg/dL
Hgb urine dipstick: NEGATIVE
Ketones, ur: NEGATIVE mg/dL
Leukocytes,Ua: NEGATIVE
Nitrite: NEGATIVE
Protein, ur: NEGATIVE mg/dL
Specific Gravity, Urine: 1.015 (ref 1.005–1.030)
pH: 5 (ref 5.0–8.0)

## 2020-03-03 LAB — BRAIN NATRIURETIC PEPTIDE: B Natriuretic Peptide: 109 pg/mL — ABNORMAL HIGH (ref 0.0–100.0)

## 2020-03-03 LAB — LIPID PANEL
Cholesterol: 70 mg/dL (ref 0–200)
HDL: 36 mg/dL — ABNORMAL LOW (ref >40)
LDL Cholesterol: 20 mg/dL (ref 0–99)
Total CHOL/HDL Ratio: 1.9 RATIO
Triglycerides: 68 mg/dL (ref <150)
VLDL: 14 mg/dL (ref 0–40)

## 2020-03-03 LAB — ECHOCARDIOGRAM COMPLETE
Area-P 1/2: 2.07 cm2
Height: 62 in
S' Lateral: 2.8 cm
Weight: 2172.85 oz

## 2020-03-03 LAB — COMPREHENSIVE METABOLIC PANEL
ALT: 13 U/L (ref 0–44)
AST: 17 U/L (ref 15–41)
Albumin: 2.9 g/dL — ABNORMAL LOW (ref 3.5–5.0)
Alkaline Phosphatase: 36 U/L — ABNORMAL LOW (ref 38–126)
Anion gap: 8 (ref 5–15)
BUN: 10 mg/dL (ref 8–23)
CO2: 25 mmol/L (ref 22–32)
Calcium: 8.6 mg/dL — ABNORMAL LOW (ref 8.9–10.3)
Chloride: 108 mmol/L (ref 98–111)
Creatinine, Ser: 0.71 mg/dL (ref 0.44–1.00)
GFR, Estimated: >60 mL/min (ref >60)
Glucose, Bld: 85 mg/dL (ref 70–99)
Potassium: 4 mmol/L (ref 3.5–5.1)
Sodium: 141 mmol/L (ref 135–145)
Total Bilirubin: 0.6 mg/dL (ref 0.3–1.2)
Total Protein: 5.8 g/dL — ABNORMAL LOW (ref 6.5–8.1)

## 2020-03-03 LAB — CBC
HCT: 32.9 % — ABNORMAL LOW (ref 36.0–46.0)
Hemoglobin: 10.5 g/dL — ABNORMAL LOW (ref 12.0–15.0)
MCH: 30.4 pg (ref 26.0–34.0)
MCHC: 31.9 g/dL (ref 30.0–36.0)
MCV: 95.4 fL (ref 80.0–100.0)
Platelets: 155 10*3/uL (ref 150–400)
RBC: 3.45 MIL/uL — ABNORMAL LOW (ref 3.87–5.11)
RDW: 14.5 % (ref 11.5–15.5)
WBC: 7.9 10*3/uL (ref 4.0–10.5)
nRBC: 0 % (ref 0.0–0.2)

## 2020-03-03 LAB — TROPONIN I (HIGH SENSITIVITY)
Troponin I (High Sensitivity): 3 ng/L (ref <18)
Troponin I (High Sensitivity): 4 ng/L (ref <18)

## 2020-03-03 LAB — RESPIRATORY PANEL BY RT PCR (FLU A&B, COVID)
Influenza A by PCR: NEGATIVE
Influenza B by PCR: NEGATIVE
SARS Coronavirus 2 by RT PCR: NEGATIVE

## 2020-03-03 LAB — MAGNESIUM: Magnesium: 1.6 mg/dL — ABNORMAL LOW (ref 1.7–2.4)

## 2020-03-03 MED ORDER — SODIUM CHLORIDE 0.9 % IV BOLUS
500.0000 mL | Freq: Once | INTRAVENOUS | Status: AC
  Administered 2020-03-03: 500 mL via INTRAVENOUS

## 2020-03-03 MED ORDER — LIDOCAINE 5 % EX PTCH
1.0000 | MEDICATED_PATCH | CUTANEOUS | Status: DC
  Administered 2020-03-03 – 2020-03-06 (×3): 1 via TRANSDERMAL
  Filled 2020-03-03 (×4): qty 1

## 2020-03-03 MED ORDER — MAGNESIUM SULFATE 4 GM/100ML IV SOLN
4.0000 g | Freq: Once | INTRAVENOUS | Status: AC
  Administered 2020-03-03: 4 g via INTRAVENOUS
  Filled 2020-03-03: qty 100

## 2020-03-03 MED ORDER — NITROGLYCERIN 0.4 MG/SPRAY TL SOLN
1.0000 | Status: DC | PRN
  Filled 2020-03-03: qty 12

## 2020-03-03 MED ORDER — METOPROLOL TARTRATE 12.5 MG HALF TABLET
12.5000 mg | ORAL_TABLET | Freq: Two times a day (BID) | ORAL | Status: DC
  Administered 2020-03-04: 12.5 mg via ORAL
  Filled 2020-03-03: qty 1

## 2020-03-03 MED ORDER — MIDODRINE HCL 5 MG PO TABS
5.0000 mg | ORAL_TABLET | Freq: Three times a day (TID) | ORAL | Status: DC
  Administered 2020-03-03 – 2020-03-06 (×9): 5 mg via ORAL
  Filled 2020-03-03 (×9): qty 1

## 2020-03-03 MED ORDER — ENOXAPARIN SODIUM 40 MG/0.4ML ~~LOC~~ SOLN
40.0000 mg | SUBCUTANEOUS | Status: DC
  Administered 2020-03-03 – 2020-03-06 (×4): 40 mg via SUBCUTANEOUS
  Filled 2020-03-03 (×4): qty 0.4

## 2020-03-03 MED ORDER — FLECAINIDE ACETATE 50 MG PO TABS
50.0000 mg | ORAL_TABLET | Freq: Two times a day (BID) | ORAL | Status: DC
  Administered 2020-03-03 – 2020-03-06 (×7): 50 mg via ORAL
  Filled 2020-03-03 (×9): qty 1

## 2020-03-03 MED ORDER — SODIUM CHLORIDE 0.9 % IV BOLUS
250.0000 mL | Freq: Once | INTRAVENOUS | Status: AC
  Administered 2020-03-03: 250 mL via INTRAVENOUS

## 2020-03-03 NOTE — Progress Notes (Signed)
MD notified that pt cont to c/o right sided chest pain. MD at bedside, see new orders, will cont to monitor.

## 2020-03-03 NOTE — Progress Notes (Signed)
FPTS Interim Progress Note  S: Responded to page about right-sided chest pain.  Patient was asleep initially per RN report in appeared comfortable, had placed order for repeat EKG and plan to monitor symptoms.  Patient still having chest pain, evaluate patient at bedside.  At time of evaluation, patient reported having chest pain for about an hour and a half.  Chest pain is on the right side of her chest, radiating down her right arm.  She also feels numbness in her left arm.  Patient reported having more chest pain over the past week as she ran out of ranolazine 1 week ago due to cost.  However, patient reports current chest pain is worse than previous chest pain.  O: BP (!) 166/67 (BP Location: Left Arm)    Pulse (!) 55    Temp 98.9 F (37.2 C) (Oral)    Resp 18    Ht 5\' 2"  (1.575 m)    Wt 61.6 kg    SpO2 92%    BMI 24.84 kg/m   Gen: Elderly woman lying comfortably in bed, NAD CV: Bradycardic, regular rhythm, chest pain reproducible on palpation Resp: breathing comfortably on room air   A/P:  Atypical chest pain EKG reviewed, sinus bradycardia without any ST changes, largely unchanged from prior EKG.  Troponins obtained on admission were flat.  Given worsening pain, will repeat troponin.  Discussed case with Dr. Paticia Stack, cardiology. He is in agreement with repeating EKG and trending troponins again. Recommended try nitroglycerin if blood pressures are stable and could also give fluids if needed.  - NTG spray prn - troponin x2  Zola Button, MD 03/03/2020, 10:13 PM PGY-1, [facility] pager [phone]

## 2020-03-03 NOTE — ED Notes (Signed)
Orthostatic vitals completed. Pt had dizziness and hypotension upon standing.

## 2020-03-03 NOTE — ED Notes (Signed)
Dr. Posey Pronto informed pt continues to c/o dizziness after 500 mL Bolus. Pending new orders

## 2020-03-03 NOTE — Consult Note (Signed)
Cardiology Consultation:   Patient ID: BREKYN HUNTOON; [id number]; 09-11-1945   Admit date: 03/02/2020 Date of Consult: 03/03/2020  Primary Care Provider: Lind Guest, NP Primary Cardiologist: Dr. Berniece Salines, MD   Patient Profile:   AASHA DINA is a 74 y.o. female with a hx of HTN, COPD, CAD seen on coronary CTA, HLD, DM2, and colon cancer s/p surgery who is being seen today for the evaluation of hypotension and chest pain at the request of Dr. Arby Barrette.  History of Present Illness:   Ms. Selkirk is a 74yo F with a hx as stated above who presented to [facility] 03/03/20 with persistent dizziness felt to be secondary to orthostatic hypotension. She reports a 3 week hx of dizziness with standing which has progressed to symptoms with sitting and lying. She states that she has been seen on several occasions and was most recently seen two weeks ago at [facility] after sustaining a fall for  presumed syncopal episode. During that visit, she states that's she was dx with vertigo and was prescribed valium 5mg  BID and midodrine. She states that she came to the hospital yesterday after she was unable to stand upright due to spinning. On my exam today, she is lying on her side and states that she is comfortable without symptoms but is she were to turn onto her other side (movement) she would become extremely symptomatic. She also states that she was having chest pain on ED arrival which she has also had in the past. She did have a CCTA performed which showed moderate nonobstructive disease. She is currently chest pain free with no symptoms of dizziness. She denies recent orthopnea, LE edema, or palpitations. She did have mild SOB on ED arrival yesterday however this is typically not a problem for her.   On initial ED presentation, there was concern for CVA given ataxic gait therefore neurology was consulted who feels symptoms are most likely due to orthostatic hypotension. BNP was normal aty  109. hsT normal at 3>>4. CXR with cardiomegaly. Head CT and MRI normal.    Ms. Widjaja is followed by Dr. Harriet Masson for her cardiology care and was most recently seen 01/17/20 for follow up. In chart review, there is a great synopsis of her current issues with hypertension/hypotnesion, orthostatics and dizziness. She was initially seen 01/19/19 in the post-hospital setting where she presented with syncope due to orthostatic hypotension. Her medications were optimized including stopping her diuretics and was started on midodrine and Florinef.  She was also placed on a ZIO monitor with predominant underlying rhythm as NSR with a total of 64 supraventricular tachycardia runs, occasional PACs and PVCs with no pauses, AV block or atrial fibrillation present. On follow up she was found to he hypertensive however deferred antihypertensives at that time. By 02/28/19 BPs remained elevated and she had evidence of atrial tachycardia therefore carvedilol 6.25mg  was started. On 04/02/19 she began having concerns of chest pain described as a left-sided burning sensation which radiated to her left arm and also caused left arm numbness. During her visit CTA was discussed and performed 06/07/19 which did show coronary artery disease with a calcium score at 345 which is 85% for age and sex matched control with moderate non-obstructive CAD>>sent for FFR with no significant stenosis. She has been tried both Imdur and Ranexxa.   She had an admission to [facility] early 2021 with left facial droop and weakness at which time she was dx with Bell's Palsy. By 09/18/19  she was referred to ENT for the evaluation of dizziness which sounded much like vertigo. Her hydralazine was decreased with improvement in her symptoms by follow up 10/17/19.   Past Medical History:  Diagnosis Date  . Anxiety   . Arthritis   . Bell's palsy 07/31/2019  . Cancer ([place])    colon  . Chronic diarrhea of unknown origin 01/12/2012  . COPD (chronic obstructive  pulmonary disease) ([place])   . Coronary artery disease of native artery of native heart with stable angina pectoris ([place]) 06/11/2019  . Diabetes mellitus   . Dizziness 09/18/2019  . Dysrhythmia   . Essential hypertension 06/11/2019  . Hyperlipemia 01/19/2019  . Hyperlipidemia   . Hypertension   . Mixed hyperlipidemia 06/11/2019  . Mood disorder ([place])   . Normocytic anemia 09/12/2014  . Orthostatic hypotension   . Paroxysmal atrial tachycardia ([place]) 06/11/2019  . Shortness of breath   . Type II diabetes mellitus ([place]) 01/19/2019  . Vitamin B12 deficiency 09/13/2014  . Weight loss 09/12/2014    Past Surgical History:  Procedure Laterality Date  . APPENDECTOMY    . BACK SURGERY    . CHOLECYSTECTOMY    . COLON SURGERY    . EUS  01/12/2012   Procedure: UPPER ENDOSCOPIC ULTRASOUND (EUS) RADIAL;  Surgeon: Arta Silence, MD;  Location: [facility] ENDOSCOPY;  Service: Endoscopy;  Laterality: N/A;  to be admitted after  . HAND SURGERY    . REPLACEMENT TOTAL KNEE       Prior to Admission medications   Medication Sig Start Date End Date Taking? Authorizing Provider  alendronate (FOSAMAX) 70 MG tablet Take 70 mg by mouth every Sunday. Take with a full glass of water on an empty stomach.    Yes [provider]  aspirin EC 81 MG tablet Take 81 mg by mouth daily.   Yes [provider]  atorvastatin (LIPITOR) 40 MG tablet Take 40 mg by mouth daily.   Yes [provider]  carvedilol (COREG) 12.5 MG tablet Take 12.5 mg by mouth 2 (two) times daily. 06/11/19  Yes [provider]  citalopram (CELEXA) 10 MG tablet Take 10 mg by mouth daily.   Yes [provider]  diazepam (VALIUM) 5 MG tablet Take 5 mg by mouth 2 (two) times daily. 02/01/20  Yes [provider]  dicyclomine (BENTYL) 20 MG tablet Take 20 mg by mouth 3 (three) times daily. 01/19/20  Yes [provider]  diphenoxylate-atropine (LOMOTIL) 2.5-0.025 MG tablet Take 2 tablets by mouth 4 (four) times  daily. 11/29/19  Yes [provider]  famotidine (PEPCID) 40 MG tablet Take 40 mg by mouth daily.  08/22/19  Yes [provider]  FEROSUL 325 (65 Fe) MG tablet Take 325 mg by mouth daily. 12/27/19  Yes [provider]  furosemide (LASIX) 20 MG tablet Take 20 mg by mouth daily as needed for fluid.  02/25/20  Yes [provider]  loperamide (IMODIUM) 2 MG capsule Take 2 mg by mouth every 6 (six) hours as needed for diarrhea or loose stools.   Yes [provider]  meclizine (ANTIVERT) 25 MG tablet Take 25 mg by mouth every 6 (six) hours as needed for dizziness.   Yes [provider]  midodrine (PROAMATINE) 5 MG tablet Take 1 tablet (5 mg total) by mouth 3 (three) times daily with meals. 02/14/20  Yes Tobb, Kardie, DO  montelukast (SINGULAIR) 10 MG tablet TAKE 1 TABLET BY MOUTH AT BEDTIME Patient taking differently: Take 10  mg by mouth at bedtime.  07/16/19  Yes Cox, Kirsten, MD  ondansetron (ZOFRAN-ODT) 4 MG disintegrating tablet Take 4 mg by mouth every 4 (four) hours as needed for nausea.  02/03/20  Yes [provider]  pantoprazole (PROTONIX) 40 MG tablet Take 40 mg by mouth daily.   Yes [provider]  potassium chloride SA (KLOR-CON) 20 MEQ tablet Take 20 mEq by mouth daily.   Yes [provider]  ranolazine (RANEXA) 1000 MG SR tablet Take 1 tablet (1,000 mg total) by mouth 2 (two) times daily. 01/17/20  Yes Tobb, Kardie, DO  traMADol (ULTRAM) 50 MG tablet Take 50 mg by mouth every 6 (six) hours as needed for moderate pain.  02/25/20  Yes [provider]  amLODipine (NORVASC) 2.5 MG tablet Take 2.5 mg by mouth daily. Patient not taking: Reported on 03/03/2020 10/17/19   [provider]  hydrALAZINE (APRESOLINE) 25 MG tablet Take 1 tablet (25 mg total) by mouth 2 (two) times daily. Patient not taking: Reported on 03/03/2020 09/18/19 03/03/29  Tobb, Godfrey Pick, DO  hydrOXYzine (ATARAX/VISTARIL) 25 MG tablet Take 25 mg  by mouth 3 (three) times daily as needed. Patient not taking: Reported on 03/03/2020    [provider]    Inpatient Medications: Scheduled Meds: . enoxaparin (LOVENOX) injection  40 mg Subcutaneous Q24H   Continuous Infusions:  PRN Meds: LORazepam  Allergies:    Allergies  Allergen Reactions  . Codeine Hives  . Morphine Dermatitis  . Nitroglycerin Nausea And Vomiting  . Tylenol [Acetaminophen]   . Oxycodone Rash    Can take with benadryl.    Social History:   Social History   Socioeconomic History  . Marital status: Widowed    Spouse name: Not on file  . Number of children: Not on file  . Years of education: Not on file  . Highest education level: Not on file  Occupational History  . Not on file  Tobacco Use  . Smoking status: Current Every Day Smoker    Packs/day: 0.50    Types: Cigarettes  . Smokeless tobacco: Never Used  Vaping Use  . Vaping Use: Never used  Substance and Sexual Activity  . Alcohol use: No  . Drug use: No  . Sexual activity: Never  Other Topics Concern  . Not on file  Social History Narrative  . Not on file   Social Determinants of Health   Financial Resource Strain:   . Difficulty of Paying Living Expenses: Not on file  Food Insecurity:   . Worried About Charity fundraiser in the Last Year: Not on file  . Ran Out of Food in the Last Year: Not on file  Transportation Needs:   . Lack of Transportation (Medical): Not on file  . Lack of Transportation (Non-Medical): Not on file  Physical Activity:   . Days of Exercise per Week: Not on file  . Minutes of Exercise per Session: Not on file  Stress:   . Feeling of Stress : Not on file  Social Connections:   . Frequency of Communication with Friends and Family: Not on file  . Frequency of Social Gatherings with Friends and Family: Not on file  . Attends Religious Services: Not on file  . Active Member of Clubs or Organizations: Not on file  . Attends Archivist  Meetings: Not on file  . Marital Status: Not on file  Intimate Partner Violence:   . Fear of Current or Ex-Partner: Not on  file  . Emotionally Abused: Not on file  . Physically Abused: Not on file  . Sexually Abused: Not on file    Family History:   Family History  Problem Relation Age of Onset  . Cerebral aneurysm Mother   . CVA Father   . Heart attack Father   . Heart attack Sister    Family Status:  Family Status  Relation Name Status  . Mother  Deceased  . Father  Deceased  . Sister  Alive    ROS:  Please see the history of present illness.  All other ROS reviewed and negative.     Physical Exam/Data:   Vitals:   03/03/20 0800 03/03/20 0900 03/03/20 1000 03/03/20 1100  BP: (!) 132/56 (!) 146/50 (!) 145/82 (!) 133/55  Pulse: (!) 55 (!) 51 60 (!) 56  Resp: 16 15 17 17   Temp:      TempSrc:      SpO2: 94% 93% 96% 92%  Weight:      Height:        Intake/Output Summary (Last 24 hours) at 03/03/2020 1141 Last data filed at 03/03/2020 0243 Gross per 24 hour  Intake 500 ml  Output --  Net 500 ml   Filed Weights   03/02/20 1735  Weight: 61.6 kg   Body mass index is 24.84 kg/m.   General: Elderly, NAD Neck: Negative for carotid bruits. No JVD Lungs:Clear to ausculation bilaterally. No wheezes, rales, or rhonchi. Breathing is unlabored. Cardiovascular: RRR with S1 S2. Soft murmur Abdomen: Soft, non-tender, non-distended. No obvious abdominal masses. Extremities: No edema. Radial pulses 2+ bilaterally Neuro: Alert and oriented. Left sided changes consistent with prior dx of Bells Palsy Psych: Responds to questions appropriately with normal affect.    EKG:  The EKG was personally reviewed and demonstrates:  03/02/20 SB with HR 52bpm and anterolateral changes when compared to prior tracing from 01/22/20  Telemetry:  Telemetry was personally reviewed and demonstrates: 03/03/20 SB with HR in the 50's   Relevant CV Studies:  Zio 02/21/2019:  The Patient wore  the monitor for 13 days 23 hours. Indication: Syncope The minimum HR was 49 bp with a maximum HR of 176 bpm, and average HR of 70 bpm.  Predominant underlying rhythm was Sinus Rhythm. A total of 64 Supraventricular Tachycardia runs occurred, the run with the fastest interval lasting 8 beats with a max rate of 176 bpm, the longest lasting 14.1 secs with an avg rate of 127 bpm. The episodes of Supraventricular Tachycardia (likely Atrial Tachycardia with variable block). Occasional Premature atrial complex (PAC) at [phone] total- 2.4%. [PACs Couplets (<1.0%, 3122), and PACs Triplets were rare (<1.0%, 81)]. Premature ventricular complex were rare (<1.0%),  PVC Couplets were rare (<1.0%), with no PVC Triplets Present. No Pauses, No AV block and no Atrial fibrillation present.  CCTA 06/07/19:  IMPRESSION: 1. Coronary calcium score of 345. This was 6 percentile for age and sex matched control.  2. Normal coronary origin with right dominance.  3. Moderate Non-obstructive coronary artery disease. CAD-RADS 3. This study will sent for FFR.  1. Left Main:  No significant stenosis.  2. LAD: No significant stenosis. 3. LCX: No significant stenosis. 4. RCA: No significant stenosis.  IMPRESSION: 1.  CT FFR analysis didn't show any significant stenosis  Laboratory Data:  Chemistry Recent Labs  Lab 03/02/20 1711 03/03/20 0436  NA 139 141  K 4.3 4.0  CL 106 108  CO2 24 25  GLUCOSE 116* 85  BUN  12 10  CREATININE 0.86 0.71  CALCIUM 8.9 8.6*  GFRNONAA >60 >60  ANIONGAP 9 8    Total Protein  Date Value Ref Range Status  03/03/2020 5.8 (L) 6.5 - 8.1 g/dL Final   Albumin  Date Value Ref Range Status  03/03/2020 2.9 (L) 3.5 - 5.0 g/dL Final   AST  Date Value Ref Range Status  03/03/2020 17 15 - 41 U/L Final   ALT  Date Value Ref Range Status  03/03/2020 13 0 - 44 U/L Final   Alkaline Phosphatase  Date Value Ref Range Status  03/03/2020 36 (L) 38 - 126 U/L Final   Total  Bilirubin  Date Value Ref Range Status  03/03/2020 0.6 0.3 - 1.2 mg/dL Final   Hematology Recent Labs  Lab 03/02/20 1711 03/03/20 0436  WBC 7.8 7.9  RBC 3.85* 3.45*  HGB 11.7* 10.5*  HCT 36.2 32.9*  MCV 94.0 95.4  MCH 30.4 30.4  MCHC 32.3 31.9  RDW 14.4 14.5  PLT 182 155   Cardiac EnzymesNo results for input(s): TROPONINI in the last 168 hours. No results for input(s): TROPIPOC in the last 168 hours.  BNP Recent Labs  Lab 03/02/20 1711  BNP 109.0*    DDimer No results for input(s): DDIMER in the last 168 hours. TSH:  Lab Results  Component Value Date   TSH 2.365 01/12/2012   Lipids: Lab Results  Component Value Date   CHOL 70 03/03/2020   HDL 36 (L) 03/03/2020   LDLCALC 20 03/03/2020   TRIG 68 03/03/2020   CHOLHDL 1.9 03/03/2020   HgbA1c:No results found for: HGBA1C  Radiology/Studies:  DG Chest 2 View  Result Date: 03/02/2020 CLINICAL DATA:  Chest pain EXAM: CHEST - 2 VIEW COMPARISON:  02/01/2020 FINDINGS: Mild cardiomegaly. Lingular atelectasis, scarring or infiltrate. Right lung clear. No effusions or acute bony abnormality. IMPRESSION: Cardiomegaly. Lingular density could reflect atelectasis, scarring or less likely infiltrate. Electronically Signed   By: Rolm Baptise M.D.   On: 03/02/2020 17:35   CT Head Wo Contrast  Result Date: 03/02/2020 CLINICAL DATA:  Dizziness, headache EXAM: CT HEAD WITHOUT CONTRAST TECHNIQUE: Contiguous axial images were obtained from the base of the skull through the vertex without intravenous contrast. COMPARISON:  None. FINDINGS: Brain: No acute intracranial abnormality. Specifically, no hemorrhage, hydrocephalus, mass lesion, acute infarction, or significant intracranial injury. Vascular: No hyperdense vessel or unexpected calcification. Skull: No acute calvarial abnormality. Sinuses/Orbits: Visualized paranasal sinuses and mastoids clear. Orbital soft tissues unremarkable. Other: None IMPRESSION: Normal study. Electronically  Signed   By: Rolm Baptise M.D.   On: 03/02/2020 18:46   MR BRAIN WO CONTRAST  Result Date: 03/02/2020 CLINICAL DATA:  73 year old female with dizziness, headache, neurologic deficit. EXAM: MRI HEAD WITHOUT CONTRAST TECHNIQUE: Multiplanar, multiecho pulse sequences of the brain and surrounding structures were obtained without intravenous contrast. COMPARISON:  Head CT earlier tonight. [facility] 02/01/2020 and earlier. FINDINGS: Brain: Stable cerebral volume on MRIs this year. No restricted diffusion to suggest acute infarction. No midline shift, mass effect, evidence of mass lesion, ventriculomegaly, extra-axial collection or acute intracranial hemorrhage. Cervicomedullary junction and pituitary are within normal limits. Mild for age periatrial and occasionally other scattered white matter T2 and FLAIR hyperintensity appears stable since March. No superimposed cortical encephalomalacia, chronic cerebral blood products, or signal changes in the deep gray nuclei, brainstem, or cerebellum. Vascular: Major intracranial vascular flow voids are stable. Skull and upper cervical spine: Negative for age visible cervical spine. Bone marrow signal  is stable and within normal limits. Sinuses/Orbits: Stable postoperative changes to the orbits. Mild paranasal sinus mucosal thickening is stable. Other: Mastoids remain well pneumatized. Grossly normal visible internal auditory structures, with no dedicated internal auditory imaging across this series of exams. Scalp and face soft tissues appear negative. IMPRESSION: 1.  No acute intracranial abnormality. 2. Stable MRI appearance of the brain this year with mild for age nonspecific white matter signal changes. In light of persistent dizziness, routine follow-up Head MRI without and with contrast utilizing dedicated internal auditory canal (IAC) protocol might be valuable. Electronically Signed   By: Genevie Ann M.D.   On: 03/02/2020 22:27   Assessment and Plan:     1. Hypotension with concern for CVA event: -Pt presented with worsening dizziness which has been progressive over the last several weeks>>>now dizziness with sitting and lying?? Recently seen at [facility] dx witth vertigo and was prescribed diazepam BID? She reports someone else puts her medications in her med containers for her so unclear exactly what she is taking.   -CT imaging stable with no acute concern>>neurology consulted who feels symptoms are more likely due to orthostatic hypotension rather than neurologic in etiology  -Orthostatic vitals include: sitting-132/59 (53); lying-116/65 (52); standing-97/60 (64) -Would recommend stopping carvedilol in the setting of hypotension and bradycardia.  -Other PTA meds include hydralazine 25 BID, amlodipine 2.5 QD>>continue to hold for now -Echocardiogram performed today 03/03/20 with normal LVEF at 60-65%, NRWMA, mild LVH, G1DD, dilated LA, no MS, mild aortic sclerosis without stenosis.  -Would recommend obtaining LE compression stocking>>up to thighs if needed and increasing Na+ in her diet>>could use the addition of Gatorade or IV hydration additives for extra sodium intake  -Certainly some of her symptoms sound like vertigo with dizziness with lying and -Follow with ENT for symptoms consistent with vertigo>>  2. Moderate, non-obstructive CAD with atypical chest pain: -per recent CCTA>>performed in the setting of chest pain. -FFR sent with no obstructive disease and plans for risk factor modification  -She reports repeat chest pain prior to presentation with normal hsT levels at 3>>>4 and stable echocardiogram with no RWMA.  -No significant EKG changes>>>will review with MD to see if further ischemic workup is need however in the setting of a relatively normal CCTA in 05/2019>>would recommend continue monitoring of symptoms for now.  -Continue ASA, statin   3. Paroxysmal atrial tachycardia: -Noted on ZIO monitor 02/21/2019 treated with  carvedilol -Would hold for now given HRs in the 50's  -Denies palpitations    4. HLD: -Last LDL, 20   For questions or updates, please contact [place] Please consult [email or web address] for contact info under Cardiology/STEMI.   SignedKathyrn Drown NP-C [facility] Pager: [phone] 03/03/2020 11:41 AM   I have seen and examined the patient along with Kathyrn Drown NP-C.  I have reviewed the chart, notes and new data.  I agree with PA/NP's note.  Key new complaints: she is a remarkably poor historian and uses loose, imprecise terms to describe her complaints. After some more detailed questioning, I believe that she is using the term "chest pain" to describe palpitations - her heart is fluttering and seems to want to "bust out of her chest". This is the reason for which she lies always on the left side - the "pain" (read palpitations) becomes more intense lying on her back or on her right side. Her "dizziness" and "syncope" are likewise difficult to characterize. There is clearly a big component of orthostatic hypotension, which is also  objectively demonstrated. Key examination changes: RRR, 2/6 early peaking Ao sclerosis murmur, no edema/rales/JVD. 35 mm Hg drop in SBP from sitting to standing. Key new findings / data: reviewed coronary CT angio and event monitor.  PLAN:  - I do not think she is describing angina pectoris and therefore do not believe that antianginal meds such as ranolazine and amlodipine will be beneficial. - She has very frequent PACs and episodes of brief nonsustained atrial tachycardia, which probably cause her palpitations. Conventional agents such as beta blockers and centrally acting calcium channel blockers worsen her orthostatic hypotension and a tendency to bradycardia. - the source of her more serious events appears to be severe orthostatic hypotension. Avoid rapidly acting potent vasodilators such as hydralazine and diuretics. Avoid beta blockers that also  have alpha blocking properties (carvedilol and nebivolol), especially when using alpha agonists (midodrine).  - attempting to perfectly "fix" her BP with medications will be frustrating and doomed to failure, since all meds that improve the orthostatic hypotension will worsen the supine hypertension.  1. Stop ranolazine, carvedilol, amlodipine and hydralazine. 2. Start low dose metoprolol (12.5 mg twice daily) and flecainide (50 mg twice daily). In view of her age and elevated calcium score, check a treadmill ECG stress test to look for signs of flecainide toxicity. Will try to see if we can get that done on Wednesday 10/20. 3. Midodrine titrated to keep sitting SBP <160 (and NOT <130) and standing SBP >100, at least most of the time. 4. Strongly enforce non-pharmacological therapy for orthostatic hypotension (compression stockings, abdominal binder, aggressive hydration, liberalize sodium in diet).   Sanda Klein, MD, [place] [phone] 03/03/2020, 2:15 PM

## 2020-03-03 NOTE — Progress Notes (Signed)
Family Medicine Teaching Service Daily Progress Note Intern Pager: [phone]  Patient name: Heather Mccoy Medical record number: [id number] Date of birth: 1946-01-22 Age: 74 y.o. Gender: female  Primary Care Provider: Lind Guest, NP Consultants: Neurology, Cardiology Code Status: Full   Pt Overview and Major Events to Date:  Admitted 10/18  Assessment and Plan: Heather Mccoy is a 74 y.o. female presenting with unsteadiness and dizziness for past 3 weeks . PMH is significant for hypertension, diabetes, hyperlipidemia, coronary artery disease, normocytic anemia, vitamin B12 deficiency, mood disorder, orthostatic hypotension  Dizziness 2/2 orthostatic hypotension, bradycardia  Per chart review, patient does have a history of orthostatic hypotension followed by cardiology (Heather Mccoy with [facility]). Initially in the ED, there was concern for CVA given ataxic gait on exam and past pointing, so neurology were consulted. No evidence of CVA on imaging (CT head, MRI brain) and neurologist, felt symptoms were more in likely due to orthostatic hypotension rather than neurologic in etiology. Interestingly, she has midodrine and several antihypertensives on her medication list. Could also consider symptomatic bradycardia secondary to beta blockers as patient is on Coreg 12.mg BID.  Awaiting formal med rec and cardiology recs regarding medication.  -Up with assistance -Hold all medications  -Cardiology consult am to discuss stopping some of BP meds  -F/u Echo- completed, not read  -EKG -PT/OT eval -SLP  Dysphagia Patient reporting dysphagia to solids and liquids for 1 day.  Notably, patient failed the stroke swallow screen in the ED.  No evidence of CVA on imaging.  Dysphagia can be due to various etiologies, will have SLP evaluate and further assess. -NPO -f/u SLP eval -Consider barium swallow Vs EGD to investigate cause of dysphagia   CVA Reported hx of stroke in May 2021  however no evidence on chart review. Home meds: ASA 81mg  -Hold ASA as NPO   Hypertension, hypotension SBP 108-139, DBP 48-60 It appears patient takes amlodipine 2.5 mg daily, carvedilol 12.5 mg twice daily, hydralazine 25 mg twice daily, midodrine 5mg  TID.  Will await formal med rec.  Holding antihypertensives in the setting of likely orthostatic hypotension.  Type 2 diabetes No recent A1c on file. No antihyperglycemics on her medication list. Will continue to monitor for now. -Monitor CBGs  -F/u A1c -sSSI if CBG>180   Hyperlipidemia It appears she takes atorvastatin 40 mg daily. Awaiting formal med rec. -F/u lipid panel -Hold statin as NPO   Coronary artery disease, paroxysmal atrial tachycardia Denies chest pain at time of exam but did endorse chest pain with ED provider. Patient with chronic exertional chest pain due to stable angina, which is followed by cardiology, Heather Mccoy.  Exertional dyspnea likely also due to stable angina. CAD diagnosed on CCTA. At home, takes a long-acting antianginal (ranolazine) 1000 mg, ASA 81 mg, carvedilol. Low suspicion for ACS on admission.  Troponin 3>4, EKG without ischemic changes -Monitor K and Mg -Hold ranolazine, ASA and carvedilol as NPO  -Cardiology consult am  HFpEF Echo in March 2021 showed normal left ventricular systolic function, EF 55 to 62%, grade 1 diastolic dysfunction.Left atrium is severely dilated. Right atrium is mildly enlarged. On exam no signs of fluid overload. Pt has been known to take lasix in the past. BNP on admission 106. Current weight: 135.8lbs. No output recorded yet -Is and Os  Bells palsy  Noted to have Bell palsy for the last 3 months. One exam obvious ptosis of left eyelid, reduced facial movement of left sided face -Monitor sx  Normocytic anemia Hb 11.7 on presentation. MCV 94. Appears to be chronic, home meds include Ferosul 3255 mg daily -Monitor on CBC - transfusion threshold 8 given CAD  -Hold  ferosul as NPO   Dysuria Dysuria and urinary frequency for the past 3 weeks per patient.  UA unremarkable, does show rare bacteria do not suspect urinary tract infection at this time.   - continue to monitor symptoms   Mood disorder Home meds: Celexa 10 mg - Hold while NPO  COPD Home meds include: Singulair 10 mg at bedtime -Hold Singulair as n.p.o. -Encourage smoking cessation -Nicotine patch PRN   GERD  Denies reflux or epigastric pain.  Home meds include famotidine 40 mg daily, Protonix 40 mg daily   Osteoporosis Home meds include alendronate 70 mg once a week  Irritable bowel syndrome with diarrhea Home meds include Lomotil, Bentyl -Hold Lomotil and Bentyl due to potential side effect of dizziness.  H/o colon cancer 2012,  s/p surgical resection    FEN/GI: NPO  Prophylaxis: Lovenox   Disposition: Med-tele   Subjective:  No acute events over night. Patient denied any complaints this morning   Objective: Temp:  [98.2 F (36.8 C)-98.4 F (36.9 C)] 98.4 F (36.9 C) (10/17 1753) Pulse Rate:  [49-64] 49 (10/18 0500) Resp:  [12-20] 17 (10/18 0500) BP: (97-140)/(45-69) 123/54 (10/18 0500) SpO2:  [92 %-95 %] 93 % (10/18 0500) Weight:  [61.6 kg] 61.6 kg (10/17 1735) Physical Exam: General: Elderly lady, NAD Cardiovascular: RRR no murmurs Respiratory: CTAB normal WOB Abdomen: soft, non distended Extremities: warm, dry. Well perfused. Distal pulses 2+   Laboratory: Recent Labs  Lab 03/02/20 1711 03/03/20 0436  WBC 7.8 7.9  HGB 11.7* 10.5*  HCT 36.2 32.9*  PLT 182 155   Recent Labs  Lab 03/02/20 1711  NA 139  K 4.3  CL 106  CO2 24  BUN 12  CREATININE 0.86  CALCIUM 8.9  GLUCOSE 116*     Imaging/Diagnostic Tests: DG Chest 2 View  Result Date: 03/02/2020 CLINICAL DATA:  Chest pain EXAM: CHEST - 2 VIEW COMPARISON:  02/01/2020 FINDINGS: Mild cardiomegaly. Lingular atelectasis, scarring or infiltrate. Right lung clear. No effusions or acute  bony abnormality. IMPRESSION: Cardiomegaly. Lingular density could reflect atelectasis, scarring or less likely infiltrate. Electronically Signed   By: Heather Mccoy M.D.   On: 03/02/2020 17:35   CT Head Wo Contrast  Result Date: 03/02/2020 CLINICAL DATA:  Dizziness, headache EXAM: CT HEAD WITHOUT CONTRAST TECHNIQUE: Contiguous axial images were obtained from the base of the skull through the vertex without intravenous contrast. COMPARISON:  None. FINDINGS: Brain: No acute intracranial abnormality. Specifically, no hemorrhage, hydrocephalus, mass lesion, acute infarction, or significant intracranial injury. Vascular: No hyperdense vessel or unexpected calcification. Skull: No acute calvarial abnormality. Sinuses/Orbits: Visualized paranasal sinuses and mastoids clear. Orbital soft tissues unremarkable. Other: None IMPRESSION: Normal study. Electronically Signed   By: Heather Mccoy M.D.   On: 03/02/2020 18:46   MR BRAIN WO CONTRAST  Result Date: 03/02/2020 CLINICAL DATA:  74 year old female with dizziness, headache, neurologic deficit. EXAM: MRI HEAD WITHOUT CONTRAST TECHNIQUE: Multiplanar, multiecho pulse sequences of the brain and surrounding structures were obtained without intravenous contrast. COMPARISON:  Head CT earlier tonight. [facility] 02/01/2020 and earlier. FINDINGS: Brain: Stable cerebral volume on MRIs this year. No restricted diffusion to suggest acute infarction. No midline shift, mass effect, evidence of mass lesion, ventriculomegaly, extra-axial collection or acute intracranial hemorrhage. Cervicomedullary junction and pituitary are within normal limits. Mild  for age periatrial and occasionally other scattered white matter T2 and FLAIR hyperintensity appears stable since March. No superimposed cortical encephalomalacia, chronic cerebral blood products, or signal changes in the deep gray nuclei, brainstem, or cerebellum. Vascular: Major intracranial vascular flow voids are  stable. Skull and upper cervical spine: Negative for age visible cervical spine. Bone marrow signal is stable and within normal limits. Sinuses/Orbits: Stable postoperative changes to the orbits. Mild paranasal sinus mucosal thickening is stable. Other: Mastoids remain well pneumatized. Grossly normal visible internal auditory structures, with no dedicated internal auditory imaging across this series of exams. Scalp and face soft tissues appear negative. IMPRESSION: 1.  No acute intracranial abnormality. 2. Stable MRI appearance of the brain this year with mild for age nonspecific white matter signal changes. In light of persistent dizziness, routine follow-up Head MRI without and with contrast utilizing dedicated internal auditory canal (IAC) protocol might be valuable. Electronically Signed   By: Genevie Ann M.D.   On: 03/02/2020 22:27   VAS [place] UPPER EXTREMITY ARTERIAL DUPLEX  Result Date: 02/14/2020 UPPER EXTREMITY DUPLEX STUDY Indications: Patient complains of Coronary artery disease of native artery of              native heart with stable angina pectoris ([place]) [I25.118              (ICD-10-CM)] and bruising.  Risk Factors:  Hypertension, hyperlipidemia, Diabetes. Other Factors: Dizziness Performing Technologist: Jeneen Rinks Reel RDCS, RVT  Examination Guidelines: A complete evaluation includes B-mode imaging, spectral Doppler, color Doppler, and power Doppler as needed of all accessible portions of each vessel. Bilateral testing is considered an integral part of a complete examination. Limited examinations for reoccurring indications may be performed as noted.  Right Doppler Findings: +---------------+----------+---------+--------+--------+ Site           PSV (cm/s)Waveform StenosisComments +---------------+----------+---------+--------+--------+ Subclavian Prox66        triphasic                 +---------------+----------+---------+--------+--------+                                                     +---------------+----------+---------+--------+--------+ Axillary       49.00     triphasic                 +---------------+----------+---------+--------+--------+ Brachial Prox  74        triphasic                 +---------------+----------+---------+--------+--------+ Brachial Dist  96        triphasic                 +---------------+----------+---------+--------+--------+ Radial Prox    74        triphasic                 +---------------+----------+---------+--------+--------+ Radial Dist    57        triphasic                 +---------------+----------+---------+--------+--------+ Ulnar Prox     84        triphasic                 +---------------+----------+---------+--------+--------+ Ulnar Dist     52        triphasic                 +---------------+----------+---------+--------+--------+  Left Doppler Findings: +---------------+----------+---------+--------+--------+ Site           PSV (cm/s)Waveform StenosisComments +---------------+----------+---------+--------+--------+ Subclavian Prox71        triphasic                 +---------------+----------+---------+--------+--------+ Axillary       70.00     triphasic                 +---------------+----------+---------+--------+--------+ Brachial Prox  58        triphasic                 +---------------+----------+---------+--------+--------+ Brachial Dist  78        triphasic                 +---------------+----------+---------+--------+--------+ Radial Prox    52        triphasic                 +---------------+----------+---------+--------+--------+ Radial Dist    44        triphasic                 +---------------+----------+---------+--------+--------+ Ulnar Prox     69        triphasic                 +---------------+----------+---------+--------+--------+ Ulnar Dist     58        triphasic                  +---------------+----------+---------+--------+--------+   Summary:  Right: No obstruction visualized in the right upper extremity. Left: No obstruction visualized in the left upper extremity. *See table(s) above for measurements and observations. Electronically signed by Shirlee More MD on 02/14/2020 at 12:23:01 PM.    Final    ECHOCARDIOGRAM COMPLETE  Result Date: 03/03/2020    ECHOCARDIOGRAM REPORT   Patient Name:   TATIONA STECH Dutan Date of Exam: 03/03/2020 Medical Rec #:  [id number]        Height:       62.0 in Accession #:    [id number]       Weight:       135.8 lb Date of Birth:  1945/07/30        BSA:          1.622 m Patient Age:    64 years         BP:           132/56 mmHg Patient Gender: F                HR:           50 bpm. Exam Location:  Inpatient Procedure: 2D Echo Indications:    CHF 428  History:        Patient has prior history of Echocardiogram examinations, most                 recent 07/21/2019. CAD, COPD; Risk Factors:Diabetes, Hypertension,                 Dyslipidemia and Current Smoker. Paroxysmal atrial tachycardia.  Sonographer:    Jannett Celestine RDCS (AE) Referring Phys: [id number] DAN FLOYD IMPRESSIONS  1. Left ventricular ejection fraction, by estimation, is 60 to 65%. The left ventricle has normal function. The left ventricle has no regional wall motion abnormalities. There is mild left ventricular hypertrophy. Left ventricular diastolic parameters are consistent with Grade I diastolic dysfunction (impaired relaxation).  2. Right ventricular systolic function  is normal. The right ventricular size is normal.  3. Left atrial size was mildly dilated.  4. The mitral valve is normal in structure. Trivial mitral valve regurgitation. No evidence of mitral stenosis.  5. The aortic valve is tricuspid. Aortic valve regurgitation is not visualized. Mild aortic valve sclerosis is present, with no evidence of aortic valve stenosis.  6. The inferior vena cava is normal in size with greater than 50%  respiratory variability, suggesting right atrial pressure of 3 mmHg. FINDINGS  Left Ventricle: Left ventricular ejection fraction, by estimation, is 60 to 65%. The left ventricle has normal function. The left ventricle has no regional wall motion abnormalities. The left ventricular internal cavity size was normal in size. There is  mild left ventricular hypertrophy. Left ventricular diastolic parameters are consistent with Grade I diastolic dysfunction (impaired relaxation). Right Ventricle: The right ventricular size is normal.Right ventricular systolic function is normal. Left Atrium: Left atrial size was mildly dilated. Right Atrium: Right atrial size was normal in size. Pericardium: Trivial pericardial effusion is present. Mitral Valve: The mitral valve is normal in structure. Trivial mitral valve regurgitation. No evidence of mitral valve stenosis. Tricuspid Valve: The tricuspid valve is normal in structure. Tricuspid valve regurgitation is trivial. No evidence of tricuspid stenosis. Aortic Valve: The aortic valve is tricuspid. Aortic valve regurgitation is not visualized. Mild aortic valve sclerosis is present, with no evidence of aortic valve stenosis. Pulmonic Valve: The pulmonic valve was normal in structure. Pulmonic valve regurgitation is trivial. No evidence of pulmonic stenosis. Aorta: The aortic root is normal in size and structure. Venous: The inferior vena cava is normal in size with greater than 50% respiratory variability, suggesting right atrial pressure of 3 mmHg. IAS/Shunts: No atrial level shunt detected by color flow Doppler.  LEFT VENTRICLE PLAX 2D LVIDd:         4.10 cm  Diastology LVIDs:         2.80 cm  LV e' medial:    5.77 cm/s LV PW:         1.20 cm  LV E/e' medial:  13.4 LV IVS:        1.20 cm  LV e' lateral:   7.62 cm/s LVOT diam:     2.15 cm  LV E/e' lateral: 10.1 LV SV:         75 LV SV Index:   46 LVOT Area:     3.63 cm  LEFT ATRIUM             Index       RIGHT ATRIUM            Index LA diam:        5.00 cm 3.08 cm/m  RA Area:     13.00 cm LA Vol (A2C):   59.3 ml 36.57 ml/m RA Volume:   28.20 ml  17.39 ml/m LA Vol (A4C):   71.0 ml 43.78 ml/m LA Biplane Vol: 65.4 ml 40.33 ml/m  AORTIC VALVE LVOT Vmax:   75.10 cm/s LVOT Vmean:  52.700 cm/s LVOT VTI:    0.207 m  AORTA Ao Root diam: 3.20 cm MITRAL VALVE MV Area (PHT): 2.07 cm     SHUNTS MV Decel Time: 366 msec     Systemic VTI:  0.21 m MV E velocity: 77.10 cm/s   Systemic Diam: 2.15 cm MV A velocity: 111.00 cm/s MV E/A ratio:  0.69 Kirk Ruths MD Electronically signed by Kirk Ruths MD Signature Date/Time: 03/03/2020/11:51:52 AM  Final     Shary Key, DO 03/03/2020, 7:56 AM PGY-1, [place] Intern pager: [phone], text pages welcome

## 2020-03-03 NOTE — Progress Notes (Addendum)
Called cardiology on-call Dr. Vickki Muff regarding for consult. Pt is known to cardiology-Dr Tobb and has a hx of orthostatic hypotension, congestive heart failure and coronary artery disease. I explained it is unclear why she is on multiple anti-hypertensives, midodrine and fludrocortisone. He agreed that all of patient's home medications including antihypertensives and beta-blocker should be stopped for now and given a bolus of fluid to see if her blood pressure and symptoms improve.  Recommended calling cardiology in the morning for formal consult. Appreciate recommendations.  Lattie Haw, MD, PGY 2 [facility] Family Medicine

## 2020-03-03 NOTE — Evaluation (Signed)
Clinical/Bedside Swallow Evaluation Patient Details  Name: Heather Mccoy MRN: [id number] Date of Birth: 1945/11/29  Today's Date: 03/03/2020 Time: SLP Start Time (ACUTE ONLY): 61 SLP Stop Time (ACUTE ONLY): 1500 SLP Time Calculation (min) (ACUTE ONLY): 30 min  Past Medical History:  Past Medical History:  Diagnosis Date   Anxiety    Arthritis    Bell's palsy 07/31/2019   Cancer ([place])    colon   Chronic diarrhea of unknown origin 01/12/2012   COPD (chronic obstructive pulmonary disease) (HCC)    Coronary artery disease of native artery of native heart with stable angina pectoris ([place]) 06/11/2019   Diabetes mellitus    Dizziness 09/18/2019   Dysrhythmia    Essential hypertension 06/11/2019   Hyperlipemia 01/19/2019   Hyperlipidemia    Hypertension    Mixed hyperlipidemia 06/11/2019   Mood disorder (HCC)    Normocytic anemia 09/12/2014   Orthostatic hypotension    Paroxysmal atrial tachycardia (HCC) 06/11/2019   Shortness of breath    Type II diabetes mellitus ([place]) 01/19/2019   Vitamin B12 deficiency 09/13/2014   Weight loss 09/12/2014   Past Surgical History:  Past Surgical History:  Procedure Laterality Date   APPENDECTOMY     BACK SURGERY     CHOLECYSTECTOMY     COLON SURGERY     EUS  01/12/2012   Procedure: UPPER ENDOSCOPIC ULTRASOUND (EUS) RADIAL;  Surgeon: Arta Silence, MD;  Location: [facility];  Service: Endoscopy;  Laterality: N/A;  to be admitted after   HAND SURGERY     REPLACEMENT TOTAL KNEE     HPI:  74yo female admitted 03/02/20 with 3wk hx dizziness. PMH: CVA (5/21), Left Bell's palsy, HTN, DM, HLD, CAD, colon cancer, normocytic anemia, Vitamin B12 deficiency, mood disorder, orthostatic hypotension. MRI = no acute abnormality   Assessment / Plan / Recommendation Clinical Impression  Pt seen at bedside for clinical swallow evaluation to determine PO diet. Pt reports intermittent difficulty with whole pills, which she manages by  breaking them into pieces. Pt has a history of Bell's Palsy (July or August 2021) on the left with mild residual weakness. Pt is edentulous, and her dentures are at home. She reports being able to eat without them. Pt accepted trials of thin liquid, puree, and solid textures. Adequate oral prep and clearing, and no overt s/s aspiration on any consistency. Recommend regular solids and thin liquids. No ST follow up recommended at this time. Please reconsult if needs arise.    SLP Visit Diagnosis: Dysphagia, unspecified (R13.10)    Aspiration Risk  Mild aspiration risk    Diet Recommendation Regular;Thin liquid   Liquid Administration via: Cup;Straw Medication Administration: Whole meds with liquid Supervision: Patient able to self feed Postural Changes: Remain upright for at least 30 minutes after po intake;Seated upright at 90 degrees    Other  Recommendations Oral Care Recommendations: Oral care BID   Follow up Recommendations None          Prognosis Prognosis for Safe Diet Advancement: Good      Swallow Study   General Date of Onset: 03/02/20 HPI: 74yo female admitted 03/02/20 with 3wk hx dizziness. PMH: CVA (5/21), Left Bell's palsy, HTN, DM, HLD, CAD, colon cancer, normocytic anemia, Vitamin B12 deficiency, mood disorder, orthostatic hypotension. MRI = no acute abnormality Type of Study: Bedside Swallow Evaluation Previous Swallow Assessment: none Diet Prior to this Study: NPO Temperature Spikes Noted: No Respiratory Status: Room air History of Recent Intubation: No Behavior/Cognition: Alert;Cooperative;Pleasant mood Oral Cavity  Assessment: Within Functional Limits Oral Care Completed by SLP: No Oral Cavity - Dentition: Dentures, not available;Edentulous Vision: Functional for self-feeding Self-Feeding Abilities: Able to feed self Patient Positioning: Upright in bed Baseline Vocal Quality: Normal Volitional Cough: Strong Volitional Swallow: Able to elicit     Oral/Motor/Sensory Function Overall Oral Motor/Sensory Function: Mild impairment Facial ROM: Reduced left Facial Symmetry: Abnormal symmetry left Facial Strength: Reduced left Facial Sensation: Within Functional Limits Lingual ROM: Within Functional Limits Lingual Symmetry: Within Functional Limits Lingual Strength: Within Functional Limits Lingual Sensation: Within Functional Limits Velum: Within Functional Limits Mandible: Within Functional Limits   Ice Chips Ice chips: Not tested   Thin Liquid Thin Liquid: Within functional limits Presentation: Straw;Cup    Puree Puree: Within functional limits Presentation: Spoon;Self Fed   Solid     Solid: Within functional limits Presentation: [place] B. Quentin [place], [facility], [place] Speech Language Pathologist Office: [phone]  Shonna Chock 03/03/2020,3:10 PM

## 2020-03-03 NOTE — Progress Notes (Signed)
°  Echocardiogram 2D Echocardiogram has been performed.  Heather Mccoy 03/03/2020, 8:54 AM

## 2020-03-03 NOTE — Evaluation (Signed)
Physical Therapy Evaluation Patient Details Name: Heather Mccoy MRN: [id number] DOB: [birth year] Today's Date: 03/03/2020   History of Present Illness  74 yo female with Hx of hypotension and recent stroke was admitted in ED for weeks of dizziness.  Had clear MRI of head, noted no BP drop with gait during PT.  PMHx:  Bells palsy, stroke, dysuria, vertigo, dysphagia, DM, HTN, mood disorder, anemia, CHF, CAD, Paroxysmal atrial tachycardia  Clinical Impression  Pt was seen to assess mobility and note her weakness remaining on L side from stroke in earlier 2021, and has been home with no assistance recently in a heavily staired environment.  Her family are helpful but will need to recover the loss of independence with gait, her loss of balance in standing and residual vertigo symptoms during standing.  Noted no drop in BP after gait, with final reading of 132/76.  Will focus on endurance and balance standing by working on L side strength, mild inattention to L side and increase her ROM on LLE.  Pt may benefit from platform attachment to L side of walker.    Follow Up Recommendations SNF    Equipment Recommendations  Rolling walker with 5" wheels    Recommendations for Other Services       Precautions / Restrictions Precautions Precautions: Fall Precaution Comments: monitor BP Restrictions Weight Bearing Restrictions: No      Mobility  Bed Mobility Overal bed mobility: Needs Assistance Bed Mobility: Supine to Sit;Sit to Supine     Supine to sit: Min assist Sit to supine: Min assist      Transfers Overall transfer level: Needs assistance Equipment used: Rolling walker (2 wheeled);1 person hand held assist Transfers: Sit to/from Stand Sit to Stand: Min assist         General transfer comment: min assist to support and monitor L side and L grip on walker  Ambulation/Gait Ambulation/Gait assistance: Min assist Gait Distance (Feet): 150 Feet (75 x 2) Assistive device:  Rolling walker (2 wheeled);1 person hand held assist Gait Pattern/deviations: Step-to pattern;Shuffle;Decreased stride length;Decreased weight shift to left;Trunk flexed Gait velocity: reduced   General Gait Details: pt is fatiguing quickly with two standing rests each way, and one sitting rest    Stairs            Wheelchair Mobility    Modified Rankin (Stroke Patients Only)       Balance Overall balance assessment: Needs assistance Sitting-balance support: Bilateral upper extremity supported;Feet supported Sitting balance-Leahy Scale: Fair     Standing balance support: Bilateral upper extremity supported;During functional activity Standing balance-Leahy Scale: Poor                               Pertinent Vitals/Pain Pain Assessment: No/denies pain    Home Living Family/patient expects to be discharged to:: Private residence (Simultaneous filing. User may not have seen previous data.) Living Arrangements: Alone (Simultaneous filing. User may not have seen previous data.) Available Help at Discharge: Family;Friend(s);Available PRN/intermittently Type of Home: House Home Access: Stairs to enter Entrance Stairs-Rails: Right;Left;Can reach both Entrance Stairs-Number of Steps: 5 Home Layout: Multi-level;Able to live on main level with bedroom/bathroom Home Equipment: Gilford Rile - 2 wheels;Shower seat Additional Comments: pt has been     Prior Function Level of Independence: Independent with assistive device(s)               Hand Dominance   Dominant Hand: Right  Extremity/Trunk Assessment   Upper Extremity Assessment Upper Extremity Assessment: Generalized weakness    Lower Extremity Assessment Lower Extremity Assessment: Generalized weakness    Cervical / Trunk Assessment Cervical / Trunk Assessment: Kyphotic  Communication   Communication: No difficulties  Cognition Arousal/Alertness: Awake/alert Behavior During Therapy: WFL for tasks  assessed/performed Overall Cognitive Status: No family/caregiver present to determine baseline cognitive functioning                                 General Comments: pt is having trouble with self limits and following instructions      General Comments General comments (skin integrity, edema, etc.): pt had BP ck pre and post gait with post gait not revealing any hypotension, but seems more fatigue related    Exercises Other Exercises Other Exercises: L side 3+ and R side 4+   Assessment/Plan    PT Assessment Patient needs continued PT services  PT Problem List Decreased strength;Decreased range of motion;Decreased activity tolerance;Decreased balance;Decreased mobility;Decreased coordination;Decreased knowledge of use of DME;Decreased safety awareness;Cardiopulmonary status limiting activity       PT Treatment Interventions DME instruction;Gait training;Stair training;Functional mobility training;Therapeutic activities;Therapeutic exercise;Balance training;Neuromuscular re-education;Patient/family education    PT Goals (Current goals can be found in the Care Plan section)  Acute Rehab PT Goals Patient Stated Goal: to be safe walking and get home PT Goal Formulation: With patient Time For Goal Achievement: 03/17/20 Potential to Achieve Goals: Good    Frequency Min 3X/week   Barriers to discharge Inaccessible home environment;Decreased caregiver support home alone with stairs to enter    Co-evaluation               AM-PAC PT "6 Clicks" Mobility  Outcome Measure Help needed turning from your back to your side while in a flat bed without using bedrails?: A Little Help needed moving from lying on your back to sitting on the side of a flat bed without using bedrails?: A Little Help needed moving to and from a bed to a chair (including a wheelchair)?: A Little Help needed standing up from a chair using your arms (e.g., wheelchair or bedside chair)?: A  Little Help needed to walk in hospital room?: A Lot Help needed climbing 3-5 steps with a railing? : Total 6 Click Score: 15    End of Session Equipment Utilized During Treatment: Gait belt Activity Tolerance: Patient limited by fatigue;Treatment limited secondary to medical complications (Comment) Patient left: in bed;with call bell/phone within reach;with nursing/sitter in room Nurse Communication: Mobility status PT Visit Diagnosis: Unsteadiness on feet (R26.81);Muscle weakness (generalized) (M62.81);Hemiplegia and hemiparesis Hemiplegia - Right/Left: Left Hemiplegia - dominant/non-dominant: Non-dominant Hemiplegia - caused by: Unspecified    Time: 4982-6415 PT Time Calculation (min) (ACUTE ONLY): 33 min   Charges:   PT Evaluation $PT Eval Moderate Complexity: 1 Mod PT Treatments $Gait Training: 8-22 mins       Ramond Dial 03/03/2020, 7:06 PM  Mee Hives, PT MS Acute Rehab Dept. Number: [place] [facility] [place]

## 2020-03-03 NOTE — Progress Notes (Signed)
Pt was c/o right sided chest pain. VSS, cardiology and family medicine notified, EKG ordered and being performed at this time, pt currently asleep, will cont to monitor.

## 2020-03-03 NOTE — Consult Note (Signed)
Neurology Consultation Reason for Consult: Dizziness Referring Physician: Tyrone Nine, D  CC: Dizziness  History is obtained from: Patient  HPI: Heather Mccoy is a 74 y.o. female with a reported history of  stroke that occurred in May of this year.  She states that she has had some problems since that time, including some clumsiness on the left and weakness on the right.  Subsequently, she was diagnosed with Bell's palsy affecting the left face.  Over the past few weeks, she has noticed that she has had increasing dizziness when standing.  When I press her on what she means is dizziness, she states that it is lightheadedness that is bothering her.   ROS: A 14 point ROS was performed and is negative except as noted in the HPI.  Past Medical History:  Diagnosis Date  . Anxiety   . Arthritis   . Bell's palsy 07/31/2019  . Cancer ([place])    colon  . Chronic diarrhea of unknown origin 01/12/2012  . COPD (chronic obstructive pulmonary disease) ([place])   . Coronary artery disease of native artery of native heart with stable angina pectoris ([place]) 06/11/2019  . Diabetes mellitus   . Dizziness 09/18/2019  . Dysrhythmia   . Essential hypertension 06/11/2019  . Hyperlipemia 01/19/2019  . Hyperlipidemia   . Hypertension   . Mixed hyperlipidemia 06/11/2019  . Mood disorder ([place])   . Normocytic anemia 09/12/2014  . Orthostatic hypotension   . Paroxysmal atrial tachycardia ([place]) 06/11/2019  . Shortness of breath   . Type II diabetes mellitus ([place]) 01/19/2019  . Vitamin B12 deficiency 09/13/2014  . Weight loss 09/12/2014     Family History  Problem Relation Age of Onset  . Cerebral aneurysm Mother   . CVA Father   . Heart attack Father   . Heart attack Sister      Social History:  reports that she has been smoking cigarettes. She has been smoking about 0.50 packs per day. She has never used smokeless tobacco. She reports that she does not drink alcohol and does not use drugs.   Exam: Current vital  signs: BP (!) 121/53   Pulse (!) 56   Temp 98.4 F (36.9 C)   Resp 12   Ht 5\' 2"  (1.575 m)   Wt 61.6 kg   SpO2 92%   BMI 24.84 kg/m  Vital signs in last 24 hours: Temp:  [98.2 F (36.8 C)-98.4 F (36.9 C)] 98.4 F (36.9 C) (10/17 1753) Pulse Rate:  [51-60] 56 (10/18 0000) Resp:  [12-20] 12 (10/18 0000) BP: (121-139)/(53-69) 121/53 (10/18 0000) SpO2:  [92 %-95 %] 92 % (10/18 0000) Weight:  [61.6 kg] 61.6 kg (10/17 1735)   Physical Exam  Constitutional: Appears well-developed and well-nourished.  Psych: Affect appropriate to situation Eyes: No scleral injection HENT: No OP obstrucion MSK: no joint deformities.  Cardiovascular: Normal rate and regular rhythm.  Respiratory: Effort normal, non-labored breathing GI: Soft.  No distension. There is no tenderness.  Skin: WDI  Neuro: Mental Status: Patient is awake, alert, oriented to person, place, month, year, and situation. Patient is able to give a clear and coherent history. No signs of aphasia or neglect Cranial Nerves: II: Visual Fields are full. Pupils are equal, round, and reactive to light.  III,IV, VI: EOMI without ptosis or diploplia.  V: Facial sensation is symmetric to temperature VII: Facial movement is reduced in the left face arm and leg.  Motor: Tone is normal. Bulk is normal. She has right arm  drift , does not give good effort on either side.  Sensory: Sensation is symmetric to light touch Cerebellar: She has ataxia on FNF on the left   I have reviewed labs in epic and the results pertinent to this consultation are: BMP unremarkable  I have reviewed the images obtained: MRI brain-negative  Impression: 74 year old female with some neurological deficits due to previous stroke who presents with dizziness that sounds like orthostatic hypotension.  I think that the likelihood of a neurological cause of her symptoms currently is relatively low and management at this point would be  symptomatic.  Recommendations: 1) consider discussing with cardiology stopping some of her antihypertensives or adjusting midodrine/fludrocortisone 2) consider physical therapy 3) neurology will be available as needed.   Roland Rack, MD [facility] [phone]  If 7pm- 7am, please page neurology on call as listed in [place].

## 2020-03-04 DIAGNOSIS — R42 Dizziness and giddiness: Secondary | ICD-10-CM | POA: Diagnosis not present

## 2020-03-04 DIAGNOSIS — R2681 Unsteadiness on feet: Secondary | ICD-10-CM | POA: Diagnosis not present

## 2020-03-04 DIAGNOSIS — I471 Supraventricular tachycardia: Secondary | ICD-10-CM | POA: Diagnosis not present

## 2020-03-04 LAB — BASIC METABOLIC PANEL
Anion gap: 13 (ref 5–15)
BUN: 11 mg/dL (ref 8–23)
CO2: 19 mmol/L — ABNORMAL LOW (ref 22–32)
Calcium: 8.9 mg/dL (ref 8.9–10.3)
Chloride: 105 mmol/L (ref 98–111)
Creatinine, Ser: 0.79 mg/dL (ref 0.44–1.00)
GFR, Estimated: >60 mL/min (ref >60)
Glucose, Bld: 133 mg/dL — ABNORMAL HIGH (ref 70–99)
Potassium: 4.3 mmol/L (ref 3.5–5.1)
Sodium: 137 mmol/L (ref 135–145)

## 2020-03-04 LAB — HEMOGLOBIN A1C
Hgb A1c MFr Bld: 5.1 % (ref 4.8–5.6)
Mean Plasma Glucose: 100 mg/dL

## 2020-03-04 LAB — CBC
HCT: 33.3 % — ABNORMAL LOW (ref 36.0–46.0)
Hemoglobin: 10.6 g/dL — ABNORMAL LOW (ref 12.0–15.0)
MCH: 29.8 pg (ref 26.0–34.0)
MCHC: 31.8 g/dL (ref 30.0–36.0)
MCV: 93.5 fL (ref 80.0–100.0)
Platelets: UNDETERMINED 10*3/uL (ref 150–400)
RBC: 3.56 MIL/uL — ABNORMAL LOW (ref 3.87–5.11)
RDW: 14.2 % (ref 11.5–15.5)
WBC: 5.1 10*3/uL (ref 4.0–10.5)
nRBC: 0 % (ref 0.0–0.2)

## 2020-03-04 LAB — TROPONIN I (HIGH SENSITIVITY): Troponin I (High Sensitivity): 5 ng/L (ref <18)

## 2020-03-04 MED ORDER — ATORVASTATIN CALCIUM 40 MG PO TABS
40.0000 mg | ORAL_TABLET | Freq: Every day | ORAL | Status: DC
  Administered 2020-03-04 – 2020-03-06 (×3): 40 mg via ORAL
  Filled 2020-03-04 (×3): qty 1

## 2020-03-04 MED ORDER — TRAMADOL HCL 50 MG PO TABS
50.0000 mg | ORAL_TABLET | Freq: Once | ORAL | Status: AC
  Administered 2020-03-04: 50 mg via ORAL
  Filled 2020-03-04: qty 1

## 2020-03-04 MED ORDER — METOPROLOL SUCCINATE ER 25 MG PO TB24
12.5000 mg | ORAL_TABLET | Freq: Every day | ORAL | Status: DC
  Administered 2020-03-05 – 2020-03-06 (×2): 12.5 mg via ORAL
  Filled 2020-03-04 (×2): qty 1

## 2020-03-04 MED ORDER — ACETAMINOPHEN 325 MG PO TABS: 650.0000 mg | ORAL_TABLET | Freq: Four times a day (QID) | ORAL | Status: DC | PRN

## 2020-03-04 MED ORDER — ONDANSETRON HCL 4 MG PO TABS
4.0000 mg | ORAL_TABLET | Freq: Three times a day (TID) | ORAL | Status: DC | PRN
  Administered 2020-03-04 – 2020-03-05 (×4): 4 mg via ORAL
  Filled 2020-03-04 (×4): qty 1

## 2020-03-04 MED ORDER — TRAMADOL HCL 50 MG PO TABS
50.0000 mg | ORAL_TABLET | Freq: Four times a day (QID) | ORAL | Status: DC | PRN
  Administered 2020-03-04 – 2020-03-06 (×6): 50 mg via ORAL
  Filled 2020-03-04 (×6): qty 1

## 2020-03-04 MED ORDER — ONDANSETRON HCL 4 MG/2ML IJ SOLN
4.0000 mg | Freq: Once | INTRAMUSCULAR | Status: AC
  Administered 2020-03-04: 4 mg via INTRAVENOUS
  Filled 2020-03-04: qty 2

## 2020-03-04 NOTE — TOC Initial Note (Signed)
Transition of Care [facility]) - Initial/Assessment Note    Patient Details  Name: Heather Mccoy MRN: [id number] Date of Birth: [birth year]  Transition of Care [facility]) CM/SW Contact:    Bethann Berkshire, [place] Phone Number: 03/04/2020, 10:41 AM  Clinical Narrative:                  CSW met with pt for SNF consult. Pt does not want SNF and wants to go home with [facility]. She lives alone in home with 5 steps. States she has an aunt and a friend who help her daily. Pt also states that her daughter lives in [place] and calls pt multiple times/day. Pt states that Daughters husband works in [place] and will check on pt if there are any problems. Pt has a walker, cane, and shower seat at home. She will need a 3-in-1. Pt states she has [place] from [place]. Pt does not consent to CSW contacting family to discuss d/c plans and states she will speak to them.   CSW arranged 3 in 1 to be delivered to pt home. CSW contacted [facility] and confirmed pt has [facility] RN. CSW requested to add PT. Oval [facility] will just need d/c summary and orders upon d/c.  Expected Discharge Plan: [place] Barriers to Discharge: Continued Medical Work up   Patient Goals and CMS Choice Patient states their goals for this hospitalization and ongoing recovery are:: home w hh CMS Medicare.gov Compare Post Acute Care list provided to:: Patient Choice offered to / list presented to : Patient  Expected Discharge Plan and Services Expected Discharge Plan: [place] In-house Referral: Clinical Social Work   Post Acute Care Choice: Durable Medical Equipment, Home Health Living arrangements for the past 2 months: [place]                 DME Arranged: 3-N-1 DME Agency: [employer] Date DME Agency Contacted: 03/04/20 Time DME Agency Contacted: [phone]   [place] Arranged: RN, PT HH Agency: [place] Date [facility] Agency Contacted: 03/04/20 Time HH Agency Contacted: [phone]     Prior Living Arrangements/Services Living arrangements for the past 2 months: [place] with:: Self Patient language and need for interpreter reviewed:: Yes Do you feel safe going back to the place where you live?: Yes      Need for Family Participation in Patient Care: No (Comment) Care giver support system in place?: Yes (comment) Current home services: DME, Home RN Criminal Activity/Legal Involvement Pertinent to Current Situation/Hospitalization: No - Comment as needed  Activities of Daily Living Home Assistive Devices/Equipment: Cane (specify quad or straight), Walker (specify type) ADL Screening (condition at time of admission) Patient's cognitive ability adequate to safely complete daily activities?: Yes Is the patient deaf or have difficulty hearing?: Yes Does the patient have difficulty seeing, even when wearing glasses/contacts?: Yes Does the patient have difficulty concentrating, remembering, or making decisions?: No Patient able to express need for assistance with ADLs?: Yes Does the patient have difficulty dressing or bathing?: Yes Independently performs ADLs?: Yes (appropriate for developmental age) Does the patient have difficulty walking or climbing stairs?: Yes Weakness of Legs: None Weakness of Arms/Hands: Left  Permission Sought/Granted   Permission granted to share information with : No              Emotional Assessment Appearance:: Appears stated age Attitude/Demeanor/Rapport: Self-Confident Affect (typically observed): Pleasant Orientation: : Oriented to Self, Oriented  to Place, Oriented to  Time, Oriented to Situation Alcohol / Substance Use: Not Applicable Psych Involvement: No (comment)  Admission diagnosis:  Unsteady gait [R26.81] Orthostatic dizziness [R42] Patient Active Problem List   Diagnosis Date Noted  . Orthostatic dizziness 03/03/2020  . Aortic valve sclerosis   . Unsteady gait   . Hypotension   . Anxiety   .  Arthritis   . Cancer ([place])   . COPD (chronic obstructive pulmonary disease) ([place])   . Dysrhythmia   . Hyperlipidemia   . Mood disorder ([place])   . Orthostatic hypotension   . Shortness of breath   . Dizziness 09/18/2019  . Bell's palsy 07/31/2019  . Mixed hyperlipidemia 06/11/2019  . Essential hypertension 06/11/2019  . Coronary artery disease of native artery of native heart with stable angina pectoris ([place]) 06/11/2019  . Paroxysmal atrial tachycardia ([place]) 06/11/2019  . Hypertension 01/19/2019  . Hyperlipemia 01/19/2019  . Type II diabetes mellitus ([place]) 01/19/2019  . Vitamin B12 deficiency 09/13/2014  . Normocytic anemia 09/12/2014  . Weight loss 09/12/2014  . Chronic diarrhea of unknown origin 01/12/2012   PCP:  Lind Guest, NP Pharmacy:   [facility] [address] [place] [place] [address] Phone: [phone] Fax: [phone]     Social Determinants of Health (SDOH) Interventions    Readmission Risk Interventions No flowsheet data found.

## 2020-03-04 NOTE — Hospital Course (Addendum)
Heather Mccoy is a 74 y.o. female presenting with unsteadiness and dizziness for past 3 weeks . PMH is significant for hypertension, diabetes, hyperlipidemia, coronary artery disease, normocytic anemia, vitamin B12 deficiency, mood disorder, orthostatic hypotension  Dizziness 2/2 orthostatic hypotension, bradycardia  Per chart review, patient does have a history of orthostatic hypotension followed by cardiology (Dr. Berniece Salines with [facility]). Cardiology following and adjusting medication -PT/OT eval- recommend SNF Cardiology was consulted and made the following changes to her medication:stoped ranolazine, carvedilol, amlodipine and hydralazine.Started low dose metoprolol (12.5 mg twice daily) and flecainide (50 mg twice daily). She had daily EKGs to assess for Qtc Check a treadmill ECG stress test to look for signs of flecainide toxicity today  Nausea/Vomiting  Patient complained of nausea and emesis, as well as RUQ tenderness. N/V could potentially be due to the sudden discontinuation of her home Celexa. Patient also recently started Flecainide which could be causing this nausea as well. Will check AM CMP to check liver enzymes. We restarted her home Celexa 10mg  daily.   DC recs:  PT recommends SNF Rolling walker with 5" wheels   Per TOC pt has [facility] which she will continue on discharge   Non pharm tx like compression stockings, abdominal binder, aggressive hydration, liberalize sodium in diet).   *** Outpatient submaximal treadmill ECG test (for flecainide, not for ischemia evaluation)   - flecainide 50 mg twice daily, metoprolol succinate 12.5 once daily, midodrine 5 mg three times daily - stop amlodipine, hydralazine, ranolazine - continue ASA and atorvastatin OK to tolerate SBP up to 160 mm Hg. Can reduce the midodrine dose if BP too high

## 2020-03-04 NOTE — Progress Notes (Addendum)
Progress Note  Patient Name: Heather Mccoy Date of Encounter: 03/04/2020  [facility] Cardiologist: Berniece Salines, DO   Subjective   Pt started on midodrine yesterday. Said she still felt dizzy this AM going to the bathroom and feels dizzy at times while sitting. Check orthostatics again this AM. Started on Lopressor and heart rates in the 50s. Will check EKG with flecainide. Still has dull right sided chest pain.    Inpatient Medications    Scheduled Meds: . enoxaparin (LOVENOX) injection  40 mg Subcutaneous Q24H  . flecainide  50 mg Oral Q12H  . lidocaine  1 patch Transdermal Q24H  . metoprolol tartrate  12.5 mg Oral BID  . midodrine  5 mg Oral TID WC   Continuous Infusions:   PRN Meds: LORazepam, ondansetron   Vital Signs    Vitals:   03/03/20 2020 03/04/20 0105 03/04/20 0534 03/04/20 0822  BP: (!) 166/67 (!) 147/54 133/68 (!) 154/76  Pulse: (!) 55 (!) 54  61  Resp: 18 18 18    Temp:  98.3 F (36.8 C) 98.1 F (36.7 C)   TempSrc:  Oral Oral   SpO2:  91% 93%   Weight:      Height:        Intake/Output Summary (Last 24 hours) at 03/04/2020 0920 Last data filed at 03/04/2020 0630 Gross per 24 hour  Intake 480 ml  Output 400 ml  Net 80 ml   Last 3 Weights 03/02/2020 01/17/2020 10/17/2019  Weight (lbs) 135 lb 12.9 oz 135 lb 12.8 oz 135 lb  Weight (kg) 61.6 kg 61.598 kg 61.236 kg      Telemetry    SB, HR 50s, upper 40s overnight - Personally Reviewed  ECG    Pending - Personally Reviewed  Physical Exam   GEN: No acute distress.   Neck: No JVD Cardiac: RR, bradycardic, no murmurs, rubs, or gallops.  Respiratory: Clear to auscultation bilaterally. GI: Soft, nontender, non-distended  MS: No edema; No deformity. Neuro:  Nonfocal  Psych: Normal affect   Labs    High Sensitivity Troponin:   Recent Labs  Lab 03/02/20 0108 03/02/20 1711 03/03/20 2220 03/04/20 0003  TROPONINIHS 3 4 4 5       Chemistry Recent Labs  Lab 03/02/20 1711  03/03/20 0436 03/04/20 0003  NA 139 141 137  K 4.3 4.0 4.3  CL 106 108 105  CO2 24 25 19*  GLUCOSE 116* 85 133*  BUN 12 10 11   CREATININE 0.86 0.71 0.79  CALCIUM 8.9 8.6* 8.9  PROT  --  5.8*  --   ALBUMIN  --  2.9*  --   AST  --  17  --   ALT  --  13  --   ALKPHOS  --  36*  --   BILITOT  --  0.6  --   GFRNONAA >60 >60 >60  ANIONGAP 9 8 13      Hematology Recent Labs  Lab 03/02/20 1711 03/03/20 0436 03/04/20 0003  WBC 7.8 7.9 5.1  RBC 3.85* 3.45* 3.56*  HGB 11.7* 10.5* 10.6*  HCT 36.2 32.9* 33.3*  MCV 94.0 95.4 93.5  MCH 30.4 30.4 29.8  MCHC 32.3 31.9 31.8  RDW 14.4 14.5 14.2  PLT 182 155 PLATELET CLUMPS NOTED ON SMEAR, UNABLE TO ESTIMATE    BNP Recent Labs  Lab 03/02/20 1711  BNP 109.0*     DDimer No results for input(s): DDIMER in the last 168 hours.   Radiology  DG Chest 2 View  Result Date: 03/02/2020 CLINICAL DATA:  Chest pain EXAM: CHEST - 2 VIEW COMPARISON:  02/01/2020 FINDINGS: Mild cardiomegaly. Lingular atelectasis, scarring or infiltrate. Right lung clear. No effusions or acute bony abnormality. IMPRESSION: Cardiomegaly. Lingular density could reflect atelectasis, scarring or less likely infiltrate. Electronically Signed   By: Rolm Baptise M.D.   On: 03/02/2020 17:35   CT Head Wo Contrast  Result Date: 03/02/2020 CLINICAL DATA:  Dizziness, headache EXAM: CT HEAD WITHOUT CONTRAST TECHNIQUE: Contiguous axial images were obtained from the base of the skull through the vertex without intravenous contrast. COMPARISON:  None. FINDINGS: Brain: No acute intracranial abnormality. Specifically, no hemorrhage, hydrocephalus, mass lesion, acute infarction, or significant intracranial injury. Vascular: No hyperdense vessel or unexpected calcification. Skull: No acute calvarial abnormality. Sinuses/Orbits: Visualized paranasal sinuses and mastoids clear. Orbital soft tissues unremarkable. Other: None IMPRESSION: Normal study. Electronically Signed   By: Rolm Baptise M.D.   On: 03/02/2020 18:46   MR BRAIN WO CONTRAST  Result Date: 03/02/2020 CLINICAL DATA:  74 year old female with dizziness, headache, neurologic deficit. EXAM: MRI HEAD WITHOUT CONTRAST TECHNIQUE: Multiplanar, multiecho pulse sequences of the brain and surrounding structures were obtained without intravenous contrast. COMPARISON:  Head CT earlier tonight. [facility] 02/01/2020 and earlier. FINDINGS: Brain: Stable cerebral volume on MRIs this year. No restricted diffusion to suggest acute infarction. No midline shift, mass effect, evidence of mass lesion, ventriculomegaly, extra-axial collection or acute intracranial hemorrhage. Cervicomedullary junction and pituitary are within normal limits. Mild for age periatrial and occasionally other scattered white matter T2 and FLAIR hyperintensity appears stable since March. No superimposed cortical encephalomalacia, chronic cerebral blood products, or signal changes in the deep gray nuclei, brainstem, or cerebellum. Vascular: Major intracranial vascular flow voids are stable. Skull and upper cervical spine: Negative for age visible cervical spine. Bone marrow signal is stable and within normal limits. Sinuses/Orbits: Stable postoperative changes to the orbits. Mild paranasal sinus mucosal thickening is stable. Other: Mastoids remain well pneumatized. Grossly normal visible internal auditory structures, with no dedicated internal auditory imaging across this series of exams. Scalp and face soft tissues appear negative. IMPRESSION: 1.  No acute intracranial abnormality. 2. Stable MRI appearance of the brain this year with mild for age nonspecific white matter signal changes. In light of persistent dizziness, routine follow-up Head MRI without and with contrast utilizing dedicated internal auditory canal (IAC) protocol might be valuable. Electronically Signed   By: Genevie Ann M.D.   On: 03/02/2020 22:27   ECHOCARDIOGRAM COMPLETE  Result Date:  03/03/2020    ECHOCARDIOGRAM REPORT   Patient Name:   Heather Mccoy Weinert Date of Exam: 03/03/2020 Medical Rec #:  [id number]        Height:       62.0 in Accession #:    [id number]       Weight:       135.8 lb Date of Birth:  [birth year]        BSA:          1.622 m Patient Age:    74 years         BP:           132/56 mmHg Patient Gender: F                HR:           50 bpm. Exam Location:  Inpatient Procedure: 2D Echo Indications:    CHF 428  History:  Patient has prior history of Echocardiogram examinations, most                 recent 07/21/2019. CAD, COPD; Risk Factors:Diabetes, Hypertension,                 Dyslipidemia and Current Smoker. Paroxysmal atrial tachycardia.  Sonographer:    Jannett Celestine RDCS (AE) Referring Phys: [id number] DAN FLOYD IMPRESSIONS  1. Left ventricular ejection fraction, by estimation, is 60 to 65%. The left ventricle has normal function. The left ventricle has no regional wall motion abnormalities. There is mild left ventricular hypertrophy. Left ventricular diastolic parameters are consistent with Grade I diastolic dysfunction (impaired relaxation).  2. Right ventricular systolic function is normal. The right ventricular size is normal.  3. Left atrial size was mildly dilated.  4. The mitral valve is normal in structure. Trivial mitral valve regurgitation. No evidence of mitral stenosis.  5. The aortic valve is tricuspid. Aortic valve regurgitation is not visualized. Mild aortic valve sclerosis is present, with no evidence of aortic valve stenosis.  6. The inferior vena cava is normal in size with greater than 50% respiratory variability, suggesting right atrial pressure of 3 mmHg. FINDINGS  Left Ventricle: Left ventricular ejection fraction, by estimation, is 60 to 65%. The left ventricle has normal function. The left ventricle has no regional wall motion abnormalities. The left ventricular internal cavity size was normal in size. There is  mild left ventricular hypertrophy. Left  ventricular diastolic parameters are consistent with Grade I diastolic dysfunction (impaired relaxation). Right Ventricle: The right ventricular size is normal.Right ventricular systolic function is normal. Left Atrium: Left atrial size was mildly dilated. Right Atrium: Right atrial size was normal in size. Pericardium: Trivial pericardial effusion is present. Mitral Valve: The mitral valve is normal in structure. Trivial mitral valve regurgitation. No evidence of mitral valve stenosis. Tricuspid Valve: The tricuspid valve is normal in structure. Tricuspid valve regurgitation is trivial. No evidence of tricuspid stenosis. Aortic Valve: The aortic valve is tricuspid. Aortic valve regurgitation is not visualized. Mild aortic valve sclerosis is present, with no evidence of aortic valve stenosis. Pulmonic Valve: The pulmonic valve was normal in structure. Pulmonic valve regurgitation is trivial. No evidence of pulmonic stenosis. Aorta: The aortic root is normal in size and structure. Venous: The inferior vena cava is normal in size with greater than 50% respiratory variability, suggesting right atrial pressure of 3 mmHg. IAS/Shunts: No atrial level shunt detected by color flow Doppler.  LEFT VENTRICLE PLAX 2D LVIDd:         4.10 cm  Diastology LVIDs:         2.80 cm  LV e' medial:    5.77 cm/s LV PW:         1.20 cm  LV E/e' medial:  13.4 LV IVS:        1.20 cm  LV e' lateral:   7.62 cm/s LVOT diam:     2.15 cm  LV E/e' lateral: 10.1 LV SV:         75 LV SV Index:   46 LVOT Area:     3.63 cm  LEFT ATRIUM             Index       RIGHT ATRIUM           Index LA diam:        5.00 cm 3.08 cm/m  RA Area:     13.00 cm LA Vol (A2C):  59.3 ml 36.57 ml/m RA Volume:   28.20 ml  17.39 ml/m LA Vol (A4C):   71.0 ml 43.78 ml/m LA Biplane Vol: 65.4 ml 40.33 ml/m  AORTIC VALVE LVOT Vmax:   75.10 cm/s LVOT Vmean:  52.700 cm/s LVOT VTI:    0.207 m  AORTA Ao Root diam: 3.20 cm MITRAL VALVE MV Area (PHT): 2.07 cm     SHUNTS MV  Decel Time: 366 msec     Systemic VTI:  0.21 m MV E velocity: 77.10 cm/s   Systemic Diam: 2.15 cm MV A velocity: 111.00 cm/s MV E/A ratio:  0.69 Kirk Ruths MD Electronically signed by Kirk Ruths MD Signature Date/Time: 03/03/2020/11:51:52 AM    Final     Cardiac Studies   Zio 02/21/2019:   The Patient wore the monitor for 13 days 23 hours. Indication: Syncope The minimum HR was 49 bp with a maximum HR of 176 bpm, and average HR of 70 bpm.  Predominant underlying rhythm was Sinus Rhythm. A total of 64 Supraventricular Tachycardia runs occurred, the run with the fastest interval lasting 8 beats with a max rate of 176 bpm, the longest lasting 14.1 secs with an avg rate of 127 bpm. The episodes of Supraventricular Tachycardia (likely Atrial Tachycardia with variable block). Occasional Premature atrial complex (PAC) at [phone] total- 2.4%. [PACs Couplets (<1.0%, 3122), and PACs Triplets were rare (<1.0%, 81)]. Premature ventricular complex were rare (<1.0%),  PVC Couplets were rare (<1.0%), with no PVC Triplets Present. No Pauses, No AV block and no Atrial fibrillation present.   CCTA 06/07/19:   IMPRESSION: 1. Coronary calcium score of 345. This was 10 percentile for age and sex matched control.   2. Normal coronary origin with right dominance.   3. Moderate Non-obstructive coronary artery disease. CAD-RADS 3. This study will sent for FFR.   1. Left Main:  No significant stenosis.   2. LAD: No significant stenosis. 3. LCX: No significant stenosis. 4. RCA: No significant stenosis.   IMPRESSION: 1.  CT FFR analysis didn't show any significant stenosis   Patient Profile     74 y.o. female with a hx of HTN, COPD, CAD seen on coronary CTA, HLD, DM2, and colon cancer s/p surgery who is being seen today for the evaluation of hypotension and chest pain.  Assessment & Plan    Hypotension - presented with worsening dizziness and possible stroke-like symptoms - CT head with no acute  issues. Neurology feels symptoms are orthostatic - Orthostatics with notable drop in BP  - Coreg stopped due to hypotension - PTA Hydralazine 25mg  BID, amlodipine 2.5mg  daily>>stopped for hypotension - Echo showed normal LVEF 60-65%, NWMA, mild LVH, G1DD, dilated LA, no MS, mild aortic sclerosis without stenosis.  - recommend thigh high stockings and/or abdominal binder - Started on midodrine 5mg  TID. Patient still feels dizzy. Check orthostatics today  Moderate non-obstructive CAD with atypical chest pain - pt had CCTA. FFR sent with no obstructive disease - During admission HS troponin normal and echo stable - No significant EKG changes - continue aspirin and statin  Paroxysmal Atrial tachycardia - noted on Zio monitor and started on coreg.  - Coreg held due to bradycardia and Lopressor started. HR in the 50s - Started on flecainide. Daily EKG to assess Qtc   For questions or updates, please contact [place] Please consult [email or web address] for contact info under        Signed, Cadence Ninfa Meeker, PA-C  03/04/2020, 9:20 AM    I  have seen and examined the patient along with Cadence Ninfa Meeker, PA-C .  I have reviewed the chart, notes and new data.  I agree with PA/NP's note.  Key new complaints: ROS is positive for right shoulder pain, precordial pain, unchanged palpitations, dizziness (and most other complaints one enquires about). Not dyspneic. Key examination changes: RRR, no ectopy. No edema, no JVD. BP mostly 130-150/55-70. HR around 50. She is exquisitely tender to touch over the left costochondral joints. Key new findings / data: no SVT and very little ectopy seen on telemetry, even when she complains of palpitations.  PLAN: I do not think her chest pain represents angina. Reduce the metoprolol to succinate 12.5 mg daily. Continue current dose of midodrine and flecainide. Get orthostatic vital signs. If she can safely stand and walk, consider treadmill ECG  tomorrow.  Sanda Klein, MD, [place] [phone] 03/04/2020, 11:57 AM

## 2020-03-04 NOTE — Progress Notes (Signed)
Family Medicine Teaching Service Daily Progress Note Intern Pager: [phone]  Patient name: Heather Mccoy Medical record number: [id number] Date of birth: 01/06/1946 Age: 74 y.o. Gender: female  Primary Care Provider: Lind Guest, NP Consultants: Neurology, Cardiology Code Status: Full  Pt Overview and Major Events to Date:  Admitted 10/18  Assessment and Plan: Heather Mccoy is a 74 y.o. female presenting with unsteadiness and dizziness for past 3 weeks . PMH is significant for hypertension, diabetes, hyperlipidemia, coronary artery disease, normocytic anemia, vitamin B12 deficiency, mood disorder, orthostatic hypotension  Dizziness 2/2 orthostatic hypotension, bradycardia  Per chart review, patient does have a history of orthostatic hypotension followed by cardiology (Dr. Berniece [facility]ines [facility]). Cardiology following and adjusting medication -PT/OT eval- recommend SNF - Per TOC pt has [facility] which she will continue on discharge  - per cards recs: Stop ranolazine, carvedilol, amlodipine and hydralazine. Start low dose metoprolol (12.5 mg twice daily) and flecainide (50 mg twice daily).  Check a treadmill ECG stress test to look for signs of flecainide toxicity on 10/20 Non pharm tx like compression stockings, abdominal binder, aggressive hydration, liberalize sodium in diet). Daily EKG to assess Qtc   Dysphagia Patient reporting dysphagia to solids and liquids for 1 day.  Notably, patient failed the stroke swallow screen in the ED.  No evidence of CVA on imaging.  Dysphagia can be due to various etiologies, will have SLP evaluate and further assess. - SLP- passed eval. reccomends regular solids and thin liquids   CVA Reported hx of stroke in May 2021 however no evidence on chart review. Home meds: ASA 81mg  -Hold ASA as NPO   Hypertension, hypotension SBP 108-139, DBP 48-60.  It appears patient takes amlodipine 2.5 mg daily, carvedilol 12.5 mg twice daily,  hydralazine 25 mg twice daily, midodrine 5mg  TID.   - see cardiac med recs above   Type 2 diabetes No recent A1c on file. No antihyperglycemics on her medication list. Will continue to monitor for now. -Monitor CBGs  -F/u A1c -sSSI if CBG>180   Hyperlipidemia It appears she takes atorvastatin 40 mg daily.  - Con't statin   Coronary artery disease, paroxysmal atrial tachycardia Patient with chronic exertional chest pain due to stable angina, which is followed by cardiology, Dr. Harriet Masson.  Exertional dyspnea likely also due to stable angina. CAD diagnosed on CCTA. At home, takes a long-acting antianginal (ranolazine) 1000 mg, ASA 81 mg, carvedilol. Low suspicion for ACS on admission.  Troponin 3>4, EKG without ischemic changes -Monitor K and Mg - con't meds as stated above   HFpEF Current echo shows EF 60-65% mild LVH G1DD. Mildly dilated L atrium. Echo in March 2021 showed normal left ventricular systolic function, EF 55 to 39%, grade 1 diastolic dysfunction.Left atrium is severely dilated. Right atrium is mildly enlarged. On exam no signs of fluid overload. Pt has been known to take lasix in the past. BNP on admission 106.  -Is and Os  Bells palsy  Noted to have Bell palsy for the last 3 months. One exam obvious ptosis of left eyelid, reduced facial movement of left sided face -Monitor sx   Normocytic anemia Hb 11.7 on presentation. MCV 94. Appears to be chronic, home meds include Ferosul 3255 mg daily -Monitor on CBC - transfusion threshold 8 given CAD   Dysuria Dysuria and urinary frequency for the past 3 weeks per patient.  UA unremarkable, does show rare bacteria do not suspect urinary tract infection at this time.   -  continue to monitor symptoms   COPD Home meds include: Singulair 10 mg at bedtime - Con't home med  -Encourage smoking cessation -Nicotine patch PRN   GERD  Denies reflux or epigastric pain.  Home meds include famotidine 40 mg daily, Protonix 40 mg  daily   Osteoporosis Home meds include alendronate 70 mg once a week  Irritable bowel syndrome with diarrhea Home meds include Lomotil, Bentyl -Hold Lomotil and Bentyl due to potential side effect of dizziness.  H/o colon cancer 2012,  s/p surgical resection   FEN/GI: Cardiac diet  PPx: Lovenox   Disposition: [facility] with [facility]   Subjective:   Pt complained of right sided chest pain overnight and this morning when I saw her. Stated she was not interested in going to SNF and wants to go home. Lives by herself but has a daughter that calls to check on her.   Objective: Temp:  [98.1 F (36.7 C)-98.9 F (37.2 C)] 98.1 F (36.7 C) (10/19 0534) Pulse Rate:  [51-71] 54 (10/19 0105) Resp:  [14-22] 18 (10/19 0534) BP: (132-166)/(50-82) 133/68 (10/19 0534) SpO2:  [91 %-96 %] 93 % (10/19 0534) Physical Exam: General: Elderly, NAD Cardiovascular: RRR no murmurs Respiratory: CTAB Normal WOB Abdomen: soft non distended, tender in RUQ Extremities: WWP. Distal pulses 2+   Laboratory: Recent Labs  Lab 03/02/20 1711 03/03/20 0436 03/04/20 0003  WBC 7.8 7.9 5.1  HGB 11.7* 10.5* 10.6*  HCT 36.2 32.9* 33.3*  PLT 182 155 PLATELET CLUMPS NOTED ON SMEAR, UNABLE TO ESTIMATE   Recent Labs  Lab 03/02/20 1711 03/03/20 0436 03/04/20 0003  NA 139 141 137  K 4.3 4.0 4.3  CL 106 108 105  CO2 24 25 19*  BUN 12 10 11   CREATININE 0.86 0.71 0.79  CALCIUM 8.9 8.6* 8.9  PROT  --  5.8*  --   BILITOT  --  0.6  --   ALKPHOS  --  36*  --   ALT  --  13  --   AST  --  17  --   GLUCOSE 116* 85 133*    Imaging/Diagnostic Tests:  No results found.   Shary Key, DO 03/04/2020, 8:16 AM PGY-1, [place] Intern pager: [phone], text pages welcome

## 2020-03-05 DIAGNOSIS — R112 Nausea with vomiting, unspecified: Secondary | ICD-10-CM

## 2020-03-05 DIAGNOSIS — Z79899 Other long term (current) drug therapy: Secondary | ICD-10-CM

## 2020-03-05 DIAGNOSIS — R1013 Epigastric pain: Secondary | ICD-10-CM

## 2020-03-05 LAB — COMPREHENSIVE METABOLIC PANEL
ALT: 17 U/L (ref 0–44)
AST: 22 U/L (ref 15–41)
Albumin: 3.3 g/dL — ABNORMAL LOW (ref 3.5–5.0)
Alkaline Phosphatase: 39 U/L (ref 38–126)
Anion gap: 10 (ref 5–15)
BUN: 13 mg/dL (ref 8–23)
CO2: 23 mmol/L (ref 22–32)
Calcium: 9.2 mg/dL (ref 8.9–10.3)
Chloride: 105 mmol/L (ref 98–111)
Creatinine, Ser: 0.74 mg/dL (ref 0.44–1.00)
GFR, Estimated: >60 mL/min (ref >60)
Glucose, Bld: 117 mg/dL — ABNORMAL HIGH (ref 70–99)
Potassium: 4.3 mmol/L (ref 3.5–5.1)
Sodium: 138 mmol/L (ref 135–145)
Total Bilirubin: 0.3 mg/dL (ref 0.3–1.2)
Total Protein: 6.5 g/dL (ref 6.5–8.1)

## 2020-03-05 LAB — CBC
HCT: 34.7 % — ABNORMAL LOW (ref 36.0–46.0)
Hemoglobin: 11.1 g/dL — ABNORMAL LOW (ref 12.0–15.0)
MCH: 29.5 pg (ref 26.0–34.0)
MCHC: 32 g/dL (ref 30.0–36.0)
MCV: 92.3 fL (ref 80.0–100.0)
Platelets: 153 10*3/uL (ref 150–400)
RBC: 3.76 MIL/uL — ABNORMAL LOW (ref 3.87–5.11)
RDW: 14 % (ref 11.5–15.5)
WBC: 8.5 10*3/uL (ref 4.0–10.5)
nRBC: 0 % (ref 0.0–0.2)

## 2020-03-05 LAB — BASIC METABOLIC PANEL
Anion gap: 9 (ref 5–15)
BUN: 13 mg/dL (ref 8–23)
CO2: 25 mmol/L (ref 22–32)
Calcium: 9 mg/dL (ref 8.9–10.3)
Chloride: 104 mmol/L (ref 98–111)
Creatinine, Ser: 0.74 mg/dL (ref 0.44–1.00)
GFR, Estimated: >60 mL/min (ref >60)
Glucose, Bld: 120 mg/dL — ABNORMAL HIGH (ref 70–99)
Potassium: 4.1 mmol/L (ref 3.5–5.1)
Sodium: 138 mmol/L (ref 135–145)

## 2020-03-05 MED ORDER — CITALOPRAM HYDROBROMIDE 20 MG PO TABS
10.0000 mg | ORAL_TABLET | Freq: Every day | ORAL | Status: DC
  Administered 2020-03-05 – 2020-03-06 (×2): 10 mg via ORAL
  Filled 2020-03-05 (×2): qty 1

## 2020-03-05 NOTE — Progress Notes (Addendum)
Progress Note  Patient Name: Heather Mccoy Date of Encounter: 03/05/2020  [facility] Cardiologist: Berniece Salines, DO   Subjective   Orthostatics yesterday looked negative. Qtc 454ms on EKG. She is bradycardic. She had nasuea and vomiting overnight. Also complaining of headache. Dizziness better but still minimal. Has not been up today.   Inpatient Medications    Scheduled Meds: . atorvastatin  40 mg Oral Daily  . citalopram  10 mg Oral Daily  . enoxaparin (LOVENOX) injection  40 mg Subcutaneous Q24H  . flecainide  50 mg Oral Q12H  . lidocaine  1 patch Transdermal Q24H  . metoprolol succinate  12.5 mg Oral Daily  . midodrine  5 mg Oral TID WC   Continuous Infusions:   PRN Meds: LORazepam, ondansetron, traMADol   Vital Signs    Vitals:   03/04/20 1207 03/04/20 1756 03/05/20 0028 03/05/20 0436  BP: (!) 170/59 137/64 (!) 152/63 140/72  Pulse: 62 60 (!) 52 (!) 50  Resp: 18 18 18 18   Temp: 98 F (36.7 C) 98.4 F (36.9 C) 98.3 F (36.8 C) 97.8 F (36.6 C)  TempSrc: Oral Oral Oral Oral  SpO2: 98% 94% 92% 91%  Weight:      Height:        Intake/Output Summary (Last 24 hours) at 03/05/2020 0922 Last data filed at 03/05/2020 0439 Gross per 24 hour  Intake 480 ml  Output 1301 ml  Net -821 ml   Last 3 Weights 03/02/2020 01/17/2020 10/17/2019  Weight (lbs) 135 lb 12.9 oz 135 lb 12.8 oz 135 lb  Weight (kg) 61.6 kg 61.598 kg 61.236 kg      Telemetry    SB, HR upper 40s and 50s - Personally Reviewed  ECG    SB, 51bpm, Qtc 419ms - Personally Reviewed  Physical Exam   GEN: No acute distress.   Neck: No JVD Cardiac: RRR, no murmurs, rubs, or gallops.  Respiratory: Clear to auscultation bilaterally. GI: Soft, nontender, non-distended  MS: No edema; No deformity. Neuro:  Nonfocal  Psych: Normal affect   Labs    High Sensitivity Troponin:   Recent Labs  Lab 03/02/20 0108 03/02/20 1711 03/03/20 2220 03/04/20 0003  TROPONINIHS 3 4 4 5        Chemistry Recent Labs  Lab 03/03/20 0436 03/04/20 0003 03/05/20 0426  NA 141 137 138  K 4.0 4.3 4.1  CL 108 105 104  CO2 25 19* 25  GLUCOSE 85 133* 120*  BUN 10 11 13   CREATININE 0.71 0.79 0.74  CALCIUM 8.6* 8.9 9.0  PROT 5.8*  --   --   ALBUMIN 2.9*  --   --   AST 17  --   --   ALT 13  --   --   ALKPHOS 36*  --   --   BILITOT 0.6  --   --   GFRNONAA >60 >60 >60  ANIONGAP 8 13 9      Hematology Recent Labs  Lab 03/03/20 0436 03/04/20 0003 03/05/20 0426  WBC 7.9 5.1 8.5  RBC 3.45* 3.56* 3.76*  HGB 10.5* 10.6* 11.1*  HCT 32.9* 33.3* 34.7*  MCV 95.4 93.5 92.3  MCH 30.4 29.8 29.5  MCHC 31.9 31.8 32.0  RDW 14.5 14.2 14.0  PLT 155 PLATELET CLUMPS NOTED ON SMEAR, UNABLE TO ESTIMATE 153    BNP Recent Labs  Lab 03/02/20 1711  BNP 109.0*     DDimer No results for input(s): DDIMER in the last 168 hours.  Radiology    No results found.  Cardiac Studies   Zio 02/21/2019:   The Patient wore the monitor for 13 days 23 hours. Indication: Syncope The minimum HR was 49 bp with a maximum HR of 176 bpm, and average HR of 70 bpm.  Predominant underlying rhythm was Sinus Rhythm. A total of 64 Supraventricular Tachycardia runs occurred, the run with the fastest interval lasting 8 beats with a max rate of 176 bpm, the longest lasting 14.1 secs with an avg rate of 127 bpm. The episodes of Supraventricular Tachycardia (likely Atrial Tachycardia with variable block). Occasional Premature atrial complex (PAC) at [phone] total- 2.4%. [PACs Couplets (<1.0%, 3122), and PACs Triplets were rare (<1.0%, 81)]. Premature ventricular complex were rare (<1.0%),  PVC Couplets were rare (<1.0%), with no PVC Triplets Present. No Pauses, No AV block and no Atrial fibrillation present.   CCTA 06/07/19: IMPRESSION: 1. Coronary calcium score of 345. This was 25 percentile for age and sex matched control.   2. Normal coronary origin with right dominance.   3. Moderate Non-obstructive  coronary artery disease. CAD-RADS 3. This study will sent for FFR.   1. Left Main:  No significant stenosis.   2. LAD: No significant stenosis. 3. LCX: No significant stenosis. 4. RCA: No significant stenosis.   IMPRESSION: 1.  CT FFR analysis didn't show any significant stenosis   Patient Profile     74 y.o. female with a hx of HTN, COPD, CAD seen on coronary CTA, HLD, DM2, and colon cancer s/p surgery who is being seen today for the evaluation of hypotension and chest pain.  Assessment & Plan    Hypotension - presented with worsening dizziness and possible stroke-like symptoms - CT head with no acute issues. Neurology feels symptoms are orthostatic - Initial orthostatics with notable drop in BP  - Coreg stopped due to hypotension - PTA Hydralazine 25mg  BID, amlodipine 2.5mg  daily>>stopped for hypotension - Echo showed normal LVEF 60-65%, NWMA, mild LVH, G1DD, dilated LA, no MS, mild aortic sclerosis without stenosis.  - recommend thigh high stockings and/or abdominal binder - Started on midodrine 5mg  TID.  Orthostatics yesterday:  Lying 176/69, pulse 48  Sitting 150/76, pulse 60  Standing 142/69, pulse 59 - continue midodrine. Orthostatics today   Moderate non-obstructive CAD with atypical chest pain - pt had CCTA. FFR sent with no obstructive disease - During admission HS troponin normal and echo stable - No significant EKG changes - Do not suspect CP is angina pain - continue aspirin and statin   Paroxysmal Atrial tachycardia - noted on Zio monitor and started on coreg.  - Coreg held due to bradycardia and Lopressor started. HR in the upper 40s-50s>>BB decreased to 12.5mg  daily - Started on flecainide. Qtc 48ms. Plan for treadmill EKG   N/V - also has some abdominal pain - further work-up per IM  For questions or updates, please contact [place] Please consult [email or web address] for contact info under        Signed, Cadence Ninfa Meeker, PA-C  03/05/2020, 9:22  AM    I have seen and examined the patient along with Cadence Ninfa Meeker, PA-C.  I have reviewed the chart, notes and new data.  I agree with PA/NP's note.  Key new complaints: nauseated and threw up last night. No angina. No palpitations. Key examination changes: mild bradycardia, BP mostly in 130s-150s, with a 20 mm Hg drop with orthostasis. Key new findings / data: no arrhythmia whatsoever on monitor  PLAN: I do  not think she can walk on treadmill today. We can arrange follow up outpatient treadmill ECG test.  [facility] will sign off.    Medication Recommendations:   - flecainide 50 mg twice daily, metoprolol succinate 12.5 once daily, midodrine 5 mg three times daily - stop amlodipine, hydralazine, ranolazine - continue ASA and atorvastatin OK to tolerate SBP up to 160 mm Hg. Can reduce the midodrine dose if BP too high  Other recommendations (labs, testing, etc):  Outpatient submaximal treadmill ECG test (for flecainide, not for ischemia evaluation) Follow up as an outpatient:  Will schedule with Dr. Harriet Masson in 2-3 weeks   Sanda Klein, MD, [facility] [phone] 03/05/2020, 10:27 AM

## 2020-03-05 NOTE — Progress Notes (Addendum)
Family Medicine Teaching Service Daily Progress Note Intern Pager: [phone]  Patient name: Heather Mccoy Medical record number: [id number] Date of birth: [birth year] Age: 74 y.o. Gender: female  Primary Care Provider: Lind Guest, NP Consultants: Neurology, Cardiology Code Status: Full   Pt Overview and Major Events to Date:  Admitted 10/18  Assessment and Plan:  Heather Mccoy is a 74 y.o. female presenting with unsteadiness and dizziness for past 3 weeks . PMH is significant for hypertension, diabetes, hyperlipidemia, coronary artery disease, normocytic anemia, vitamin B12 deficiency, mood disorder, orthostatic hypotension  Dizziness 2/2 orthostatic hypotension, bradycardia  Per chart review, patient does have a history of orthostatic hypotension followed by cardiology ([facility]. Berniece [facility]). Cardiology following and adjusting medication -PT/OT eval- recommend SNF - Per TOC pt has [facility] which she will continue on discharge  - per cards recs: Stop ranolazine, carvedilol, amlodipine and hydralazine. Start low dose metoprolol (12.5 mg twice daily) and flecainide (50 mg twice daily).  Check a treadmill ECG stress test to look for signs of flecainide toxicity today Non pharm tx like compression stockings, abdominal binder, aggressive hydration, liberalize sodium in diet). Daily EKG to assess Qtc   N/V Patient complaining of nausea and emesis this morning. Also has RUQ tenderness. N/V ould potentially be due to the sudden discontinuation of her home Celexa. Patient also recently started Flecainide which could be causing this nausea as well. Will check AM CMP to check liver enzymes  - restarted Celexa 10mg  daily  - am CMP  Dysphagia Patient reporting dysphagia to solids and liquids for 1 day.  Notably, patient failed the stroke swallow screen in the ED.  No evidence of CVA on imaging.  Dysphagia can be due to various etiologies, will have SLP evaluate and further  assess. - SLP- passed eval. reccomends regular solids and thin liquids   CVA Reported hx of stroke in May 2021 however no evidence on chart review. Home meds: ASA 81mg   Hypertension, hypotension SBP 108-139, DBP 48-60.  It appears patient takes amlodipine 2.5 mg daily, carvedilol 12.5 mg twice daily, hydralazine 25 mg twice daily, midodrine 5mg  TID.   - see cardiac med recs above   Type 2 diabetes No recent A1c on file. No antihyperglycemics on her medication list. Will continue to monitor for now. -Monitor CBGs  -F/u A1c -sSSI if CBG>180   Hyperlipidemia It appears she takes atorvastatin 40 mg daily.  - Con't statin   Coronary artery disease, paroxysmal atrial tachycardia Patient with chronic exertional chest pain due to stable angina, which is followed by cardiology, Dr. Harriet Masson.  Exertional dyspnea likely also due to stable angina. CAD diagnosed on CCTA. At home, takes a long-acting antianginal (ranolazine) 1000 mg, ASA 81 mg, carvedilol. Low suspicion for ACS on admission.  Troponin 3>4, EKG without ischemic changes -Monitor K and Mg - con't meds as stated above   HFpEF Current echo shows EF 60-65% mild LVH G1DD. Mildly dilated L atrium. Echo in March 2021 showed normal left ventricular systolic function, EF 55 to 57%, grade 1 diastolic dysfunction.Left atrium is severely dilated. Right atrium is mildly enlarged. On exam no signs of fluid overload. Pt has been known to take lasix in the past. BNP on admission 106.  -Is and Os  Bells palsy  Noted to have Bell palsy for the last 3 months. One exam obvious ptosis of left eyelid, reduced facial movement of left sided face -Monitor sx   Normocytic anemia Hb 11.7 on presentation.  MCV 94. Appears to be chronic, home meds include Ferosul 3255 mg daily -Monitor on CBC - transfusion threshold 8 given CAD   Dysuria Dysuria and urinary frequency for the past 3 weeks per patient.  UA unremarkable, does show rare bacteria do not  suspect urinary tract infection at this time.   - continue to monitor symptoms   COPD Home meds include: Singulair 10 mg at bedtime - Con't home med  -Encourage smoking cessation -Nicotine patch PRN   GERD  Denies reflux or epigastric pain.  Home meds include famotidine 40 mg daily, Protonix 40 mg daily   Osteoporosis Home meds include alendronate 70 mg once a week  Irritable bowel syndrome with diarrhea Home meds include Lomotil, Bentyl -Hold Lomotil and Bentyl due to potential side effect of dizziness.  H/o colon cancer 2012,  s/p surgical resection   FEN/GI: Cardiac diet  PPx: Lovenox   Disposition: Med-tele   Subjective:   Patient states she has been having headaches and nausea with vomiting over night and this morning. States she felt really bad last night. She did say the IV phenergan helped her with nausea.   Objective: Temp:  [97.8 F (36.6 C)-98.4 F (36.9 C)] 97.8 F (36.6 C) (10/20 0436) Pulse Rate:  [50-62] 50 (10/20 0436) Resp:  [18] 18 (10/20 0436) BP: (137-170)/(59-76) 140/72 (10/20 0436) SpO2:  [91 %-98 %] 91 % (10/20 0436) Physical Exam: General: Elderly, NAD Cardiovascular: RRR no murmurs Respiratory: CTAB Abdomen: soft, non distended, tender to palpation in R and L upper quadrants  Extremities: warm, dry. Distal pulses 2+   Laboratory: Recent Labs  Lab 03/03/20 0436 03/04/20 0003 03/05/20 0426  WBC 7.9 5.1 8.5  HGB 10.5* 10.6* 11.1*  HCT 32.9* 33.3* 34.7*  PLT 155 PLATELET CLUMPS NOTED ON SMEAR, UNABLE TO ESTIMATE 153   Recent Labs  Lab 03/03/20 0436 03/04/20 0003 03/05/20 0426  NA 141 137 138  K 4.0 4.3 4.1  CL 108 105 104  CO2 25 19* 25  BUN 10 11 13   CREATININE 0.71 0.79 0.74  CALCIUM 8.6* 8.9 9.0  PROT 5.8*  --   --   BILITOT 0.6  --   --   ALKPHOS 36*  --   --   ALT 13  --   --   AST 17  --   --   GLUCOSE 85 133* 120*     Imaging/Diagnostic Tests:  No results found.  Shary Key, DO 03/05/2020, 7:23  AM PGY-1, [place] Intern pager: [phone], text pages welcome

## 2020-03-06 ENCOUNTER — Ambulatory Visit: Payer: Medicare Other | Admitting: Cardiology

## 2020-03-06 LAB — CBC
HCT: 35.2 % — ABNORMAL LOW (ref 36.0–46.0)
Hemoglobin: 11.3 g/dL — ABNORMAL LOW (ref 12.0–15.0)
MCH: 30.5 pg (ref 26.0–34.0)
MCHC: 32.1 g/dL (ref 30.0–36.0)
MCV: 94.9 fL (ref 80.0–100.0)
Platelets: 162 10*3/uL (ref 150–400)
RBC: 3.71 MIL/uL — ABNORMAL LOW (ref 3.87–5.11)
RDW: 13.9 % (ref 11.5–15.5)
WBC: 8.9 10*3/uL (ref 4.0–10.5)
nRBC: 0 % (ref 0.0–0.2)

## 2020-03-06 LAB — BASIC METABOLIC PANEL
Anion gap: 7 (ref 5–15)
BUN: 13 mg/dL (ref 8–23)
CO2: 26 mmol/L (ref 22–32)
Calcium: 8.9 mg/dL (ref 8.9–10.3)
Chloride: 106 mmol/L (ref 98–111)
Creatinine, Ser: 0.93 mg/dL (ref 0.44–1.00)
GFR, Estimated: >60 mL/min (ref >60)
Glucose, Bld: 112 mg/dL — ABNORMAL HIGH (ref 70–99)
Potassium: 4.1 mmol/L (ref 3.5–5.1)
Sodium: 139 mmol/L (ref 135–145)

## 2020-03-06 MED ORDER — LIDOCAINE 5 % EX PTCH: 1.0000 | MEDICATED_PATCH | CUTANEOUS | 0 refills | Status: DC

## 2020-03-06 MED ORDER — PROMETHAZINE HCL 25 MG PO TABS
12.5000 mg | ORAL_TABLET | Freq: Once | ORAL | Status: AC
  Administered 2020-03-06: 12.5 mg via ORAL
  Filled 2020-03-06: qty 1

## 2020-03-06 MED ORDER — FLECAINIDE ACETATE 50 MG PO TABS: 50.0000 mg | ORAL_TABLET | Freq: Two times a day (BID) | ORAL | 0 refills | Status: DC

## 2020-03-06 MED ORDER — METOPROLOL SUCCINATE ER 25 MG PO TB24
12.5000 mg | ORAL_TABLET | Freq: Every day | ORAL | 0 refills | Status: DC
Start: 2020-03-07 — End: 2020-09-30

## 2020-03-06 NOTE — Discharge Instructions (Signed)
Dear Heather Mccoy,   Thank you so much for allowing [place] to be part of your care!  You were admitted to [facility] for unsteadiness and dizziness. We are glad you are feeling better!   POST-HOSPITAL & CARE INSTRUCTIONS 1. Follow up with Cardiologist Dr. Harriet Masson on 11/15 (details below) 2. Follow up with your PCP within the next week  3. Please let PCP/Specialists know of any changes that were made.  4. Please see medications section of this packet for any medication changes (listed below) Continue flecainide 50 mg twice daily, metoprolol succinate 12.5 once daily, midodrine 5 mg three times daily. Also continue aspirin and atorvastatin. Stop amlodipine, hydralazine, ranolazine  DOCTOR'S APPOINTMENT & FOLLOW UP CARE INSTRUCTIONS  Future Appointments  Date Time Provider [place]  03/17/2020  9:15 AM Evelina Bucy, DPM [facility]  03/31/2020 11:20 AM Tobb, Godfrey Pick, DO [facility] None    RETURN PRECAUTIONS: Return if you have episodes of passing out, difficulty breathing, or other concerning symptoms   Take care and be well!  [facility]  [place], [place] [address] [phone]

## 2020-03-06 NOTE — Progress Notes (Signed)
Physical Therapy Treatment Patient Details Name: Heather Mccoy MRN: [id number] DOB: 1946/03/14 Today's Date: 03/06/2020    History of Present Illness 74 yo female with Hx of hypotension and recent stroke was admitted in ED for weeks of dizziness.  Had clear MRI of head, noted no BP drop with gait during PT.  PMHx:  Bells palsy, stroke, dysuria, vertigo, dysphagia, DM, HTN, mood disorder, anemia, CHF, CAD, Paroxysmal atrial tachycardia    PT Comments    Pt was seen for mobility on RW with help to balance after being on BSC.  Pt is impulsive today, leaning over on bed and BSC with unbalanced control.  Talked with her about the presentation, but pt is not aware of the issue.  Follow acutely to work on safety and balance with standing, and to assist with progression to shorten her stay in rehab.  Pt wants to go home but will need 24/7 help to succeed with this.   Follow Up Recommendations  SNF     Equipment Recommendations  Rolling walker with 5" wheels    Recommendations for Other Services       Precautions / Restrictions Precautions Precautions: Fall Precaution Comments: monitor vitals Restrictions Weight Bearing Restrictions: No    Mobility  Bed Mobility Overal bed mobility: Needs Assistance Bed Mobility: Supine to Sit;Sit to Supine     Supine to sit: Min assist Sit to supine: Min assist   General bed mobility comments: min assist for safety and mobility  Transfers Overall transfer level: Needs assistance Equipment used: Rolling walker (2 wheeled);1 person hand held assist Transfers: Sit to/from Stand Sit to Stand: Min assist            Ambulation/Gait Ambulation/Gait assistance: Min assist Gait Distance (Feet): 40 Feet Assistive device: Rolling walker (2 wheeled);1 person hand held assist Gait Pattern/deviations: Step-to pattern;Step-through pattern;Decreased stride length Gait velocity: reduced Gait velocity interpretation: <1.31 ft/sec, indicative of  household ambulator General Gait Details: pt had one LOB on walker to R side after turning around, stated everything was "black   Stairs             Wheelchair Mobility    Modified Rankin (Stroke Patients Only)       Balance Overall balance assessment: Needs assistance Sitting-balance support: Feet supported Sitting balance-Leahy Scale: Fair     Standing balance support: Bilateral upper extremity supported;During functional activity Standing balance-Leahy Scale: Poor                              Cognition Arousal/Alertness: Awake/alert Behavior During Therapy: Impulsive Overall Cognitive Status: No family/caregiver present to determine baseline cognitive functioning                                 General Comments: pt is having trouble with self limits and following instructions      Exercises      General Comments General comments (skin integrity, edema, etc.): pt was assisted to side of bed and BSC with tendency to lean forward from either surface, unsafe and increased fall risk      Pertinent Vitals/Pain Pain Assessment: Faces Faces Pain Scale: Hurts little more Pain Location: HA Pain Descriptors / Indicators: Guarding;Grimacing Pain Intervention(s): Monitored during session;Premedicated before session;Repositioned    Home Living  Prior Function            PT Goals (current goals can now be found in the care plan section) Acute Rehab PT Goals Patient Stated Goal: to go home and walk alone Progress towards PT goals: Progressing toward goals    Frequency    Min 3X/week      PT Plan Current plan remains appropriate    Co-evaluation              AM-PAC PT "6 Clicks" Mobility   Outcome Measure  Help needed turning from your back to your side while in a flat bed without using bedrails?: A Little Help needed moving from lying on your back to sitting on the side of a flat bed without  using bedrails?: A Little Help needed moving to and from a bed to a chair (including a wheelchair)?: A Little Help needed standing up from a chair using your arms (e.g., wheelchair or bedside chair)?: A Little Help needed to walk in hospital room?: A Lot Help needed climbing 3-5 steps with a railing? : Total 6 Click Score: 15    End of Session Equipment Utilized During Treatment: Gait belt Activity Tolerance: Patient limited by fatigue;Treatment limited secondary to medical complications (Comment) Patient left: in bed;with call bell/phone within reach;with nursing/sitter in room Nurse Communication: Mobility status PT Visit Diagnosis: Unsteadiness on feet (R26.81);Muscle weakness (generalized) (M62.81);Hemiplegia and hemiparesis Hemiplegia - Right/Left: Left Hemiplegia - dominant/non-dominant: Non-dominant Hemiplegia - caused by: Unspecified     Time: 4628-6381 PT Time Calculation (min) (ACUTE ONLY): 23 min  Charges:  $Gait Training: 8-22 mins $Therapeutic Activity: 8-22 mins                   Ramond Dial 03/06/2020, 4:04 PM  Mee Hives, PT MS Acute Rehab Dept. Number: [place]

## 2020-03-06 NOTE — TOC Transition Note (Signed)
Transition of Care [facility]) - CM/SW Discharge Note   Patient Details  Name: Heather Mccoy MRN: [id number] Date of Birth: [birth year]  [facility] CM/SW Contact:  Benard Halsted, LCSW Phone Number: 03/06/2020, 5:21 PM   Clinical Narrative:    CSW faxed [place] orders and DC summary to [facility]. No other needs identified.    Final next level of care: [place] Barriers to Discharge: Barriers Resolved   Patient Goals and CMS Choice Patient states their goals for this hospitalization and ongoing recovery are:: home w hh CMS Medicare.gov Compare Post Acute Care list provided to:: Patient Choice offered to / list presented to : Patient  Discharge Placement                    Patient and family notified of of transfer: 03/06/20  Discharge Plan and Services In-house Referral: Clinical Social Work   Post Acute Care Choice: Museum/gallery conservator, Home Health          DME Arranged: 3-N-1 DME Agency: [employer] Date DME Agency Contacted: 03/04/20 Time DME Agency Contacted: [phone]   [place] Arranged: RN, PT HH Agency: [place] Date [place]: 03/04/20 Time [place]: 2080 Representative spoke with at [place]: [place] ([place]) Interventions     Readmission Risk Interventions No flowsheet data found.

## 2020-03-06 NOTE — Discharge Summary (Addendum)
[facility] Discharge Summary  Patient name: Heather Mccoy Medical record number: [id number] Date of birth: [birth year] Age: 74 y.o. Gender: female Date of Admission: 03/02/2020  Date of Discharge: 10/21 Admitting Physician: Zenia Resides, MD  Primary Care Provider: Lind Guest, NP Consultants: Neurology, Cardiology   Indication for Hospitalization: Orthostatic hypotension  Discharge Diagnoses/Problem List:   Orthostatic hypotension Nausea/vomiting   Disposition: Home with [facility]  Discharge Condition: improved, Stable  Discharge Exam:   BP (!) 153/64 (BP Location: Right Arm)   Pulse (!) 52   Temp (!) 97.5 F (36.4 C) (Oral)   Resp 18   Ht 5\' 2"  (1.575 m)   Wt 61.6 kg   SpO2 93%   BMI 24.84 kg/m   General: Elderly woman, NAD Cardiovascular: RRR no murmurs Respiratory: CTAB. Normal WOB  Abdomen: soft, non distended Extremities: warm, dry. Distal pulses 2+   Brief Hospital Course:  Heather Mccoy is a 74 y.o. female presenting with unsteadiness and dizziness for past 3 weeks . PMH is significant for hypertension, diabetes, hyperlipidemia, coronary artery disease, normocytic anemia, vitamin B12 deficiency, mood disorder, orthostatic hypotension  Dizziness 2/2 orthostatic hypotension, bradycardia  Per chart review, patient does have a history of orthostatic hypotension followed by cardiology (Dr. Berniece Salines with [facility]). On exam patient ataxic gait and past-pointing, so neurology was consulted.  CT head and MRI brain showed no evidence of CVA.  Electrolytes were unremarkable. Pt has wide pulse pressure and initial orthostatic VS showed 35 pt drop in systolic BP with standing. Cardiology was consulted and adjusted patients medication to optimize her hypertension and orthostatic hypotension. Ranolazine, carvedilol, amlodipine and hydralazine were discontinued, and she was continued on low dose metoprolol (12.5 mg twice daily) and  midodrine 5mg  TID and started on flecainide (50 mg twice daily). Pt also given compression stockings.  She had daily EKGs checked to assess for prolonged Qtc and they were unremarkable. Patient received physical and occupational therapy while in the hospital to optimize mobility. Patient remained neurologically and hemodynamically stable during her hospitalization.    N/V  Patient developed nausea and vomiting while in the hospital. This might have been due to a sudden stop in her SSRI medication, and could also be due to starting flecainide. She was put back on her medicine and received anti nausea medication which helped. By time of discharge her nausea and vomiting had improved.   Issues for Follow Up:  1. Follow up with cardiology outpatient, who will check a treadmill ECG stress test to look for signs of flecainide toxicity. Appointment scheduled below.  2. Patient advised to utilize nonpharmacological treatment such as compression stockings, abdominal binder, aggressive hydration, and liberalize sodium in diet.  3. Assess compliance and tolerance of new medication changes.   Significant Procedures: None  Significant Labs and Imaging:  Recent Labs  Lab 03/04/20 0003 03/05/20 0426 03/06/20 0332  WBC 5.1 8.5 8.9  HGB 10.6* 11.1* 11.3*  HCT 33.3* 34.7* 35.2*  PLT PLATELET CLUMPS NOTED ON SMEAR, UNABLE TO ESTIMATE 153 162   Recent Labs  Lab 03/03/20 0436 03/03/20 0436 03/04/20 0003 03/04/20 0003 03/05/20 0426 03/05/20 0426 03/05/20 1624 03/06/20 0332  NA 141  --  137  --  138  --  138 139  K 4.0   < > 4.3   < > 4.1   < > 4.3 4.1  CL 108  --  105  --  104  --  105 106  CO2 25  --  19*  --  25  --  23 26  GLUCOSE 85  --  133*  --  120*  --  117* 112*  BUN 10  --  11  --  13  --  13 13  CREATININE 0.71  --  0.79  --  0.74  --  0.74 0.93  CALCIUM 8.6*  --  8.9  --  9.0  --  9.2 8.9  MG 1.6*  --   --   --   --   --   --   --   ALKPHOS 36*  --   --   --   --   --  39  --   AST  17  --   --   --   --   --  22  --   ALT 13  --   --   --   --   --  17  --   ALBUMIN 2.9*  --   --   --   --   --  3.3*  --    < > = values in this interval not displayed.    Results/Tests Pending at Time of Discharge: None   Discharge Medications:  Allergies as of 03/06/2020      Reactions   Codeine Hives   Morphine Dermatitis   Nitroglycerin Nausea And Vomiting   Tylenol [acetaminophen]    Oxycodone Rash   Can take with benadryl.      Medication List    STOP taking these medications   amLODipine 2.5 MG tablet Commonly known as: NORVASC   carvedilol 12.5 MG tablet Commonly known as: COREG   diazepam 5 MG tablet Commonly known as: VALIUM   furosemide 20 MG tablet Commonly known as: LASIX   hydrALAZINE 25 MG tablet Commonly known as: APRESOLINE   hydrOXYzine 25 MG tablet Commonly known as: ATARAX/VISTARIL   meclizine 25 MG tablet Commonly known as: ANTIVERT   potassium chloride SA 20 MEQ tablet Commonly known as: KLOR-CON   ranolazine 1000 MG SR tablet Commonly known as: Ranexa     TAKE these medications   alendronate 70 MG tablet Commonly known as: FOSAMAX Take 70 mg by mouth every Sunday. Take with a full glass of water on an empty stomach.   aspirin EC 81 MG tablet Take 81 mg by mouth daily.   atorvastatin 40 MG tablet Commonly known as: LIPITOR Take 40 mg by mouth daily.   citalopram 10 MG tablet Commonly known as: CELEXA Take 10 mg by mouth daily.   dicyclomine 20 MG tablet Commonly known as: BENTYL Take 20 mg by mouth 3 (three) times daily.   diphenoxylate-atropine 2.5-0.025 MG tablet Commonly known as: LOMOTIL Take 2 tablets by mouth 4 (four) times daily.   famotidine 40 MG tablet Commonly known as: PEPCID Take 40 mg by mouth daily.   FeroSul 325 (65 FE) MG tablet Generic drug: ferrous sulfate Take 325 mg by mouth daily.   flecainide 50 MG tablet Commonly known as: TAMBOCOR Take 1 tablet (50 mg total) by mouth every 12  (twelve) hours.   lidocaine 5 % Commonly known as: LIDODERM Place 1 patch onto the skin daily. Remove & Discard patch within 12 hours or as directed by MD Start taking on: March 07, 2020   loperamide 2 MG capsule Commonly known as: IMODIUM Take 2 mg by mouth every 6 (six) hours as needed for diarrhea or loose stools.  metoprolol succinate 25 MG 24 hr tablet Commonly known as: TOPROL-XL Take 0.5 tablets (12.5 mg total) by mouth daily. Start taking on: March 07, 2020   midodrine 5 MG tablet Commonly known as: PROAMATINE Take 1 tablet (5 mg total) by mouth 3 (three) times daily with meals.   montelukast 10 MG tablet Commonly known as: SINGULAIR TAKE 1 TABLET BY MOUTH AT BEDTIME   ondansetron 4 MG disintegrating tablet Commonly known as: ZOFRAN-ODT Take 4 mg by mouth every 4 (four) hours as needed for nausea.   pantoprazole 40 MG tablet Commonly known as: PROTONIX Take 40 mg by mouth daily.   traMADol 50 MG tablet Commonly known as: ULTRAM Take 50 mg by mouth every 6 (six) hours as needed for moderate pain.            Durable Medical Equipment  (From admission, onward)         Start     Ordered   03/04/20 1332  For home use only DME 3 n 1  Once        03/04/20 1331          Discharge Instructions: Please refer to Patient Instructions section of EMR for full details.  Patient was counseled important signs and symptoms that should prompt return to medical care, changes in medications, dietary instructions, activity restrictions, and follow up appointments.   Follow-Up Appointments:  Follow-up Information    Tobb, Kardie, DO Follow up on 03/31/2020.   Specialty: Cardiology Why: @ 11:20AM Contact information: [place] [address] [phone]        Haydee Salter Np, NP. Schedule an appointment as soon as possible for a visit.   Specialty: Family Medicine Contact information: [place]  [id number] Menan, Windham, DO 03/06/2020, 3:45 PM PGY-1, [place]

## 2020-03-07 DIAGNOSIS — K580 Irritable bowel syndrome with diarrhea: Secondary | ICD-10-CM | POA: Diagnosis not present

## 2020-03-07 DIAGNOSIS — M5030 Other cervical disc degeneration, unspecified cervical region: Secondary | ICD-10-CM | POA: Diagnosis not present

## 2020-03-07 DIAGNOSIS — G510 Bell's palsy: Secondary | ICD-10-CM | POA: Diagnosis not present

## 2020-03-07 DIAGNOSIS — F17210 Nicotine dependence, cigarettes, uncomplicated: Secondary | ICD-10-CM | POA: Diagnosis not present

## 2020-03-07 DIAGNOSIS — I700 Atherosclerosis of aorta: Secondary | ICD-10-CM | POA: Diagnosis not present

## 2020-03-07 DIAGNOSIS — I081 Rheumatic disorders of both mitral and tricuspid valves: Secondary | ICD-10-CM | POA: Diagnosis not present

## 2020-03-07 DIAGNOSIS — M19012 Primary osteoarthritis, left shoulder: Secondary | ICD-10-CM | POA: Diagnosis not present

## 2020-03-07 DIAGNOSIS — E119 Type 2 diabetes mellitus without complications: Secondary | ICD-10-CM | POA: Diagnosis not present

## 2020-03-07 DIAGNOSIS — Z79891 Long term (current) use of opiate analgesic: Secondary | ICD-10-CM | POA: Diagnosis not present

## 2020-03-07 DIAGNOSIS — Z85038 Personal history of other malignant neoplasm of large intestine: Secondary | ICD-10-CM | POA: Diagnosis not present

## 2020-03-07 DIAGNOSIS — H547 Unspecified visual loss: Secondary | ICD-10-CM | POA: Diagnosis not present

## 2020-03-07 DIAGNOSIS — E785 Hyperlipidemia, unspecified: Secondary | ICD-10-CM | POA: Diagnosis not present

## 2020-03-07 DIAGNOSIS — M4302 Spondylolysis, cervical region: Secondary | ICD-10-CM | POA: Diagnosis not present

## 2020-03-07 DIAGNOSIS — Z7982 Long term (current) use of aspirin: Secondary | ICD-10-CM | POA: Diagnosis not present

## 2020-03-07 DIAGNOSIS — M179 Osteoarthritis of knee, unspecified: Secondary | ICD-10-CM | POA: Diagnosis not present

## 2020-03-07 DIAGNOSIS — G43909 Migraine, unspecified, not intractable, without status migrainosus: Secondary | ICD-10-CM | POA: Diagnosis not present

## 2020-03-07 DIAGNOSIS — D649 Anemia, unspecified: Secondary | ICD-10-CM | POA: Diagnosis not present

## 2020-03-07 DIAGNOSIS — E278 Other specified disorders of adrenal gland: Secondary | ICD-10-CM | POA: Diagnosis not present

## 2020-03-07 DIAGNOSIS — K219 Gastro-esophageal reflux disease without esophagitis: Secondary | ICD-10-CM | POA: Diagnosis not present

## 2020-03-07 DIAGNOSIS — Z9181 History of falling: Secondary | ICD-10-CM | POA: Diagnosis not present

## 2020-03-07 DIAGNOSIS — I110 Hypertensive heart disease with heart failure: Secondary | ICD-10-CM | POA: Diagnosis not present

## 2020-03-07 DIAGNOSIS — J449 Chronic obstructive pulmonary disease, unspecified: Secondary | ICD-10-CM | POA: Diagnosis not present

## 2020-03-07 DIAGNOSIS — Z87440 Personal history of urinary (tract) infections: Secondary | ICD-10-CM | POA: Diagnosis not present

## 2020-03-07 DIAGNOSIS — I5032 Chronic diastolic (congestive) heart failure: Secondary | ICD-10-CM | POA: Diagnosis not present

## 2020-03-10 ENCOUNTER — Telehealth: Payer: Self-pay | Admitting: Cardiology

## 2020-03-10 DIAGNOSIS — Z09 Encounter for follow-up examination after completed treatment for conditions other than malignant neoplasm: Secondary | ICD-10-CM | POA: Diagnosis not present

## 2020-03-10 DIAGNOSIS — I951 Orthostatic hypotension: Secondary | ICD-10-CM | POA: Diagnosis not present

## 2020-03-10 NOTE — Telephone Encounter (Signed)
Is she referring to the echo that was done while inpatient?

## 2020-03-10 NOTE — Telephone Encounter (Signed)
Patient calling for echo results 

## 2020-03-11 DIAGNOSIS — Z9181 History of falling: Secondary | ICD-10-CM | POA: Diagnosis not present

## 2020-03-11 DIAGNOSIS — Z7982 Long term (current) use of aspirin: Secondary | ICD-10-CM | POA: Diagnosis not present

## 2020-03-11 DIAGNOSIS — I110 Hypertensive heart disease with heart failure: Secondary | ICD-10-CM | POA: Diagnosis not present

## 2020-03-11 DIAGNOSIS — I700 Atherosclerosis of aorta: Secondary | ICD-10-CM | POA: Diagnosis not present

## 2020-03-11 DIAGNOSIS — I5032 Chronic diastolic (congestive) heart failure: Secondary | ICD-10-CM | POA: Diagnosis not present

## 2020-03-11 DIAGNOSIS — M179 Osteoarthritis of knee, unspecified: Secondary | ICD-10-CM | POA: Diagnosis not present

## 2020-03-11 DIAGNOSIS — I081 Rheumatic disorders of both mitral and tricuspid valves: Secondary | ICD-10-CM | POA: Diagnosis not present

## 2020-03-11 DIAGNOSIS — G43909 Migraine, unspecified, not intractable, without status migrainosus: Secondary | ICD-10-CM | POA: Diagnosis not present

## 2020-03-11 DIAGNOSIS — M5030 Other cervical disc degeneration, unspecified cervical region: Secondary | ICD-10-CM | POA: Diagnosis not present

## 2020-03-11 DIAGNOSIS — E278 Other specified disorders of adrenal gland: Secondary | ICD-10-CM | POA: Diagnosis not present

## 2020-03-11 DIAGNOSIS — E785 Hyperlipidemia, unspecified: Secondary | ICD-10-CM | POA: Diagnosis not present

## 2020-03-11 DIAGNOSIS — Z87440 Personal history of urinary (tract) infections: Secondary | ICD-10-CM | POA: Diagnosis not present

## 2020-03-11 DIAGNOSIS — G510 Bell's palsy: Secondary | ICD-10-CM | POA: Diagnosis not present

## 2020-03-11 DIAGNOSIS — M4302 Spondylolysis, cervical region: Secondary | ICD-10-CM | POA: Diagnosis not present

## 2020-03-11 DIAGNOSIS — J449 Chronic obstructive pulmonary disease, unspecified: Secondary | ICD-10-CM | POA: Diagnosis not present

## 2020-03-11 DIAGNOSIS — K580 Irritable bowel syndrome with diarrhea: Secondary | ICD-10-CM | POA: Diagnosis not present

## 2020-03-11 DIAGNOSIS — K219 Gastro-esophageal reflux disease without esophagitis: Secondary | ICD-10-CM | POA: Diagnosis not present

## 2020-03-11 DIAGNOSIS — E119 Type 2 diabetes mellitus without complications: Secondary | ICD-10-CM | POA: Diagnosis not present

## 2020-03-11 DIAGNOSIS — M19012 Primary osteoarthritis, left shoulder: Secondary | ICD-10-CM | POA: Diagnosis not present

## 2020-03-11 DIAGNOSIS — D649 Anemia, unspecified: Secondary | ICD-10-CM | POA: Diagnosis not present

## 2020-03-11 DIAGNOSIS — F17210 Nicotine dependence, cigarettes, uncomplicated: Secondary | ICD-10-CM | POA: Diagnosis not present

## 2020-03-11 DIAGNOSIS — Z85038 Personal history of other malignant neoplasm of large intestine: Secondary | ICD-10-CM | POA: Diagnosis not present

## 2020-03-11 DIAGNOSIS — Z79891 Long term (current) use of opiate analgesic: Secondary | ICD-10-CM | POA: Diagnosis not present

## 2020-03-11 DIAGNOSIS — H547 Unspecified visual loss: Secondary | ICD-10-CM | POA: Diagnosis not present

## 2020-03-11 NOTE — Telephone Encounter (Signed)
Attempted to call patient- no answer and no voice mail.

## 2020-03-12 DIAGNOSIS — Z7982 Long term (current) use of aspirin: Secondary | ICD-10-CM | POA: Diagnosis not present

## 2020-03-12 DIAGNOSIS — M5030 Other cervical disc degeneration, unspecified cervical region: Secondary | ICD-10-CM | POA: Diagnosis not present

## 2020-03-12 DIAGNOSIS — K580 Irritable bowel syndrome with diarrhea: Secondary | ICD-10-CM | POA: Diagnosis not present

## 2020-03-12 DIAGNOSIS — I110 Hypertensive heart disease with heart failure: Secondary | ICD-10-CM | POA: Diagnosis not present

## 2020-03-12 DIAGNOSIS — Z87440 Personal history of urinary (tract) infections: Secondary | ICD-10-CM | POA: Diagnosis not present

## 2020-03-12 DIAGNOSIS — E785 Hyperlipidemia, unspecified: Secondary | ICD-10-CM | POA: Diagnosis not present

## 2020-03-12 DIAGNOSIS — E119 Type 2 diabetes mellitus without complications: Secondary | ICD-10-CM | POA: Diagnosis not present

## 2020-03-12 DIAGNOSIS — M179 Osteoarthritis of knee, unspecified: Secondary | ICD-10-CM | POA: Diagnosis not present

## 2020-03-12 DIAGNOSIS — I5032 Chronic diastolic (congestive) heart failure: Secondary | ICD-10-CM | POA: Diagnosis not present

## 2020-03-12 DIAGNOSIS — I700 Atherosclerosis of aorta: Secondary | ICD-10-CM | POA: Diagnosis not present

## 2020-03-12 DIAGNOSIS — D649 Anemia, unspecified: Secondary | ICD-10-CM | POA: Diagnosis not present

## 2020-03-12 DIAGNOSIS — Z9181 History of falling: Secondary | ICD-10-CM | POA: Diagnosis not present

## 2020-03-12 DIAGNOSIS — Z79891 Long term (current) use of opiate analgesic: Secondary | ICD-10-CM | POA: Diagnosis not present

## 2020-03-12 DIAGNOSIS — J449 Chronic obstructive pulmonary disease, unspecified: Secondary | ICD-10-CM | POA: Diagnosis not present

## 2020-03-12 DIAGNOSIS — E278 Other specified disorders of adrenal gland: Secondary | ICD-10-CM | POA: Diagnosis not present

## 2020-03-12 DIAGNOSIS — F17210 Nicotine dependence, cigarettes, uncomplicated: Secondary | ICD-10-CM | POA: Diagnosis not present

## 2020-03-12 DIAGNOSIS — K219 Gastro-esophageal reflux disease without esophagitis: Secondary | ICD-10-CM | POA: Diagnosis not present

## 2020-03-12 DIAGNOSIS — G43909 Migraine, unspecified, not intractable, without status migrainosus: Secondary | ICD-10-CM | POA: Diagnosis not present

## 2020-03-12 DIAGNOSIS — M4302 Spondylolysis, cervical region: Secondary | ICD-10-CM | POA: Diagnosis not present

## 2020-03-12 DIAGNOSIS — Z85038 Personal history of other malignant neoplasm of large intestine: Secondary | ICD-10-CM | POA: Diagnosis not present

## 2020-03-12 DIAGNOSIS — G510 Bell's palsy: Secondary | ICD-10-CM | POA: Diagnosis not present

## 2020-03-12 DIAGNOSIS — M19012 Primary osteoarthritis, left shoulder: Secondary | ICD-10-CM | POA: Diagnosis not present

## 2020-03-12 DIAGNOSIS — I081 Rheumatic disorders of both mitral and tricuspid valves: Secondary | ICD-10-CM | POA: Diagnosis not present

## 2020-03-12 DIAGNOSIS — H547 Unspecified visual loss: Secondary | ICD-10-CM | POA: Diagnosis not present

## 2020-03-13 ENCOUNTER — Ambulatory Visit: Admitting: Podiatry

## 2020-03-17 ENCOUNTER — Ambulatory Visit (INDEPENDENT_AMBULATORY_CARE_PROVIDER_SITE_OTHER): Payer: Medicare Other | Admitting: Podiatry

## 2020-03-17 ENCOUNTER — Other Ambulatory Visit: Payer: Self-pay

## 2020-03-17 ENCOUNTER — Encounter: Payer: Self-pay | Admitting: Podiatry

## 2020-03-17 DIAGNOSIS — E1151 Type 2 diabetes mellitus with diabetic peripheral angiopathy without gangrene: Secondary | ICD-10-CM

## 2020-03-17 DIAGNOSIS — B351 Tinea unguium: Secondary | ICD-10-CM

## 2020-03-17 DIAGNOSIS — E1169 Type 2 diabetes mellitus with other specified complication: Secondary | ICD-10-CM

## 2020-03-17 NOTE — Progress Notes (Signed)
  Subjective:  Patient ID: Heather Mccoy, female    DOB: 1946/01/14,  MRN: [id number]  Chief Complaint  Patient presents with  . Nail Problem    trim nails     74 y.o. female presents with the above complaint. History confirmed with patient.   Objective:  Physical Exam: warm, good capillary refill, nail exam onychomycosis of the toenails, no trophic changes or ulcerative lesions. DP pulses palpable and protective sensation intact, PT pulses non-palpable Left Foot: normal exam, no swelling, tenderness, instability; ligaments intact, full range of motion of all ankle/foot joints  Right Foot: normal exam, no swelling, tenderness, instability; ligaments intact, full range of motion of all ankle/foot joints   No images are attached to the encounter.  Assessment:   1. 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  Onychomycosis, Diabetes and PAD -Patient is diabetic with a qualifying condition for at risk foot care.   Procedure: Nail Debridement Rationale: Patient meets criteria for routine foot care due to PAD Type of Debridement: manual, sharp debridement. Instrumentation: Nail nipper, rotary burr. Number of Nails: 10    No follow-ups on file.

## 2020-03-18 DIAGNOSIS — F17210 Nicotine dependence, cigarettes, uncomplicated: Secondary | ICD-10-CM | POA: Diagnosis not present

## 2020-03-18 DIAGNOSIS — M4302 Spondylolysis, cervical region: Secondary | ICD-10-CM | POA: Diagnosis not present

## 2020-03-18 DIAGNOSIS — H547 Unspecified visual loss: Secondary | ICD-10-CM | POA: Diagnosis not present

## 2020-03-18 DIAGNOSIS — G510 Bell's palsy: Secondary | ICD-10-CM | POA: Diagnosis not present

## 2020-03-18 DIAGNOSIS — M5030 Other cervical disc degeneration, unspecified cervical region: Secondary | ICD-10-CM | POA: Diagnosis not present

## 2020-03-18 DIAGNOSIS — G43909 Migraine, unspecified, not intractable, without status migrainosus: Secondary | ICD-10-CM | POA: Diagnosis not present

## 2020-03-18 DIAGNOSIS — E119 Type 2 diabetes mellitus without complications: Secondary | ICD-10-CM | POA: Diagnosis not present

## 2020-03-18 DIAGNOSIS — K219 Gastro-esophageal reflux disease without esophagitis: Secondary | ICD-10-CM | POA: Diagnosis not present

## 2020-03-18 DIAGNOSIS — I110 Hypertensive heart disease with heart failure: Secondary | ICD-10-CM | POA: Diagnosis not present

## 2020-03-18 DIAGNOSIS — Z87440 Personal history of urinary (tract) infections: Secondary | ICD-10-CM | POA: Diagnosis not present

## 2020-03-18 DIAGNOSIS — M19012 Primary osteoarthritis, left shoulder: Secondary | ICD-10-CM | POA: Diagnosis not present

## 2020-03-18 DIAGNOSIS — Z7982 Long term (current) use of aspirin: Secondary | ICD-10-CM | POA: Diagnosis not present

## 2020-03-18 DIAGNOSIS — J449 Chronic obstructive pulmonary disease, unspecified: Secondary | ICD-10-CM | POA: Diagnosis not present

## 2020-03-18 DIAGNOSIS — M179 Osteoarthritis of knee, unspecified: Secondary | ICD-10-CM | POA: Diagnosis not present

## 2020-03-18 DIAGNOSIS — I081 Rheumatic disorders of both mitral and tricuspid valves: Secondary | ICD-10-CM | POA: Diagnosis not present

## 2020-03-18 DIAGNOSIS — K580 Irritable bowel syndrome with diarrhea: Secondary | ICD-10-CM | POA: Diagnosis not present

## 2020-03-18 DIAGNOSIS — Z85038 Personal history of other malignant neoplasm of large intestine: Secondary | ICD-10-CM | POA: Diagnosis not present

## 2020-03-18 DIAGNOSIS — Z9181 History of falling: Secondary | ICD-10-CM | POA: Diagnosis not present

## 2020-03-18 DIAGNOSIS — I700 Atherosclerosis of aorta: Secondary | ICD-10-CM | POA: Diagnosis not present

## 2020-03-18 DIAGNOSIS — D649 Anemia, unspecified: Secondary | ICD-10-CM | POA: Diagnosis not present

## 2020-03-18 DIAGNOSIS — E785 Hyperlipidemia, unspecified: Secondary | ICD-10-CM | POA: Diagnosis not present

## 2020-03-18 DIAGNOSIS — I5032 Chronic diastolic (congestive) heart failure: Secondary | ICD-10-CM | POA: Diagnosis not present

## 2020-03-18 DIAGNOSIS — E278 Other specified disorders of adrenal gland: Secondary | ICD-10-CM | POA: Diagnosis not present

## 2020-03-18 NOTE — Telephone Encounter (Signed)
Left message for patient to return call.

## 2020-03-24 DIAGNOSIS — F17210 Nicotine dependence, cigarettes, uncomplicated: Secondary | ICD-10-CM | POA: Diagnosis not present

## 2020-03-24 DIAGNOSIS — M5030 Other cervical disc degeneration, unspecified cervical region: Secondary | ICD-10-CM | POA: Diagnosis not present

## 2020-03-24 DIAGNOSIS — E278 Other specified disorders of adrenal gland: Secondary | ICD-10-CM | POA: Diagnosis not present

## 2020-03-24 DIAGNOSIS — J449 Chronic obstructive pulmonary disease, unspecified: Secondary | ICD-10-CM | POA: Diagnosis not present

## 2020-03-24 DIAGNOSIS — K219 Gastro-esophageal reflux disease without esophagitis: Secondary | ICD-10-CM | POA: Diagnosis not present

## 2020-03-24 DIAGNOSIS — Z7982 Long term (current) use of aspirin: Secondary | ICD-10-CM | POA: Diagnosis not present

## 2020-03-24 DIAGNOSIS — I5032 Chronic diastolic (congestive) heart failure: Secondary | ICD-10-CM | POA: Diagnosis not present

## 2020-03-24 DIAGNOSIS — I700 Atherosclerosis of aorta: Secondary | ICD-10-CM | POA: Diagnosis not present

## 2020-03-24 DIAGNOSIS — K580 Irritable bowel syndrome with diarrhea: Secondary | ICD-10-CM | POA: Diagnosis not present

## 2020-03-24 DIAGNOSIS — G510 Bell's palsy: Secondary | ICD-10-CM | POA: Diagnosis not present

## 2020-03-24 DIAGNOSIS — M179 Osteoarthritis of knee, unspecified: Secondary | ICD-10-CM | POA: Diagnosis not present

## 2020-03-24 DIAGNOSIS — Z85038 Personal history of other malignant neoplasm of large intestine: Secondary | ICD-10-CM | POA: Diagnosis not present

## 2020-03-24 DIAGNOSIS — Z87440 Personal history of urinary (tract) infections: Secondary | ICD-10-CM | POA: Diagnosis not present

## 2020-03-24 DIAGNOSIS — G43909 Migraine, unspecified, not intractable, without status migrainosus: Secondary | ICD-10-CM | POA: Diagnosis not present

## 2020-03-24 DIAGNOSIS — H547 Unspecified visual loss: Secondary | ICD-10-CM | POA: Diagnosis not present

## 2020-03-24 DIAGNOSIS — M4302 Spondylolysis, cervical region: Secondary | ICD-10-CM | POA: Diagnosis not present

## 2020-03-24 DIAGNOSIS — M19012 Primary osteoarthritis, left shoulder: Secondary | ICD-10-CM | POA: Diagnosis not present

## 2020-03-24 DIAGNOSIS — D649 Anemia, unspecified: Secondary | ICD-10-CM | POA: Diagnosis not present

## 2020-03-24 DIAGNOSIS — Z9181 History of falling: Secondary | ICD-10-CM | POA: Diagnosis not present

## 2020-03-24 DIAGNOSIS — I110 Hypertensive heart disease with heart failure: Secondary | ICD-10-CM | POA: Diagnosis not present

## 2020-03-24 DIAGNOSIS — I081 Rheumatic disorders of both mitral and tricuspid valves: Secondary | ICD-10-CM | POA: Diagnosis not present

## 2020-03-24 DIAGNOSIS — E119 Type 2 diabetes mellitus without complications: Secondary | ICD-10-CM | POA: Diagnosis not present

## 2020-03-24 DIAGNOSIS — E785 Hyperlipidemia, unspecified: Secondary | ICD-10-CM | POA: Diagnosis not present

## 2020-03-28 DIAGNOSIS — I110 Hypertensive heart disease with heart failure: Secondary | ICD-10-CM | POA: Diagnosis not present

## 2020-03-28 DIAGNOSIS — E785 Hyperlipidemia, unspecified: Secondary | ICD-10-CM | POA: Diagnosis not present

## 2020-03-28 DIAGNOSIS — J449 Chronic obstructive pulmonary disease, unspecified: Secondary | ICD-10-CM | POA: Diagnosis not present

## 2020-03-28 DIAGNOSIS — M179 Osteoarthritis of knee, unspecified: Secondary | ICD-10-CM | POA: Diagnosis not present

## 2020-03-28 DIAGNOSIS — Z87440 Personal history of urinary (tract) infections: Secondary | ICD-10-CM | POA: Diagnosis not present

## 2020-03-28 DIAGNOSIS — I5032 Chronic diastolic (congestive) heart failure: Secondary | ICD-10-CM | POA: Diagnosis not present

## 2020-03-28 DIAGNOSIS — Z7982 Long term (current) use of aspirin: Secondary | ICD-10-CM | POA: Diagnosis not present

## 2020-03-28 DIAGNOSIS — K219 Gastro-esophageal reflux disease without esophagitis: Secondary | ICD-10-CM | POA: Diagnosis not present

## 2020-03-28 DIAGNOSIS — E278 Other specified disorders of adrenal gland: Secondary | ICD-10-CM | POA: Diagnosis not present

## 2020-03-28 DIAGNOSIS — F17210 Nicotine dependence, cigarettes, uncomplicated: Secondary | ICD-10-CM | POA: Diagnosis not present

## 2020-03-28 DIAGNOSIS — M19012 Primary osteoarthritis, left shoulder: Secondary | ICD-10-CM | POA: Diagnosis not present

## 2020-03-28 DIAGNOSIS — G43909 Migraine, unspecified, not intractable, without status migrainosus: Secondary | ICD-10-CM | POA: Diagnosis not present

## 2020-03-28 DIAGNOSIS — G510 Bell's palsy: Secondary | ICD-10-CM | POA: Diagnosis not present

## 2020-03-28 DIAGNOSIS — H547 Unspecified visual loss: Secondary | ICD-10-CM | POA: Diagnosis not present

## 2020-03-28 DIAGNOSIS — D649 Anemia, unspecified: Secondary | ICD-10-CM | POA: Diagnosis not present

## 2020-03-28 DIAGNOSIS — I700 Atherosclerosis of aorta: Secondary | ICD-10-CM | POA: Diagnosis not present

## 2020-03-28 DIAGNOSIS — K580 Irritable bowel syndrome with diarrhea: Secondary | ICD-10-CM | POA: Diagnosis not present

## 2020-03-28 DIAGNOSIS — M5030 Other cervical disc degeneration, unspecified cervical region: Secondary | ICD-10-CM | POA: Diagnosis not present

## 2020-03-28 DIAGNOSIS — Z85038 Personal history of other malignant neoplasm of large intestine: Secondary | ICD-10-CM | POA: Diagnosis not present

## 2020-03-28 DIAGNOSIS — E119 Type 2 diabetes mellitus without complications: Secondary | ICD-10-CM | POA: Diagnosis not present

## 2020-03-28 DIAGNOSIS — Z9181 History of falling: Secondary | ICD-10-CM | POA: Diagnosis not present

## 2020-03-28 DIAGNOSIS — I081 Rheumatic disorders of both mitral and tricuspid valves: Secondary | ICD-10-CM | POA: Diagnosis not present

## 2020-03-28 DIAGNOSIS — M4302 Spondylolysis, cervical region: Secondary | ICD-10-CM | POA: Diagnosis not present

## 2020-03-31 ENCOUNTER — Other Ambulatory Visit: Payer: Self-pay

## 2020-03-31 ENCOUNTER — Encounter: Payer: Self-pay | Admitting: Cardiology

## 2020-03-31 ENCOUNTER — Ambulatory Visit (INDEPENDENT_AMBULATORY_CARE_PROVIDER_SITE_OTHER): Payer: Medicare Other | Admitting: Cardiology

## 2020-03-31 VITALS — Ht 62.0 in | Wt 138.4 lb

## 2020-03-31 DIAGNOSIS — R42 Dizziness and giddiness: Secondary | ICD-10-CM

## 2020-03-31 DIAGNOSIS — Z5181 Encounter for therapeutic drug level monitoring: Secondary | ICD-10-CM

## 2020-03-31 DIAGNOSIS — I25118 Atherosclerotic heart disease of native coronary artery with other forms of angina pectoris: Secondary | ICD-10-CM | POA: Diagnosis not present

## 2020-03-31 DIAGNOSIS — I471 Supraventricular tachycardia: Secondary | ICD-10-CM

## 2020-03-31 DIAGNOSIS — Z79899 Other long term (current) drug therapy: Secondary | ICD-10-CM

## 2020-03-31 DIAGNOSIS — I951 Orthostatic hypotension: Secondary | ICD-10-CM

## 2020-03-31 NOTE — Patient Instructions (Addendum)
Medication Instructions:  No medication changes. *If you need a refill on your cardiac medications before your next appointment, please call your pharmacy*   Lab Work: Your physician recommends that you had a BMET, Magnesium and flecainide level.  If you have labs (blood work) drawn today and your tests are completely normal, you will receive your results only by: Marland Kitchen MyChart Message (if you have MyChart) OR . A paper copy in the mail If you have any lab test that is abnormal or we need to change your treatment, we will call you to review the results.   Testing/Procedures: None ordered   Follow-Up: At [facility], you and your health needs are our priority.  As part of our continuing mission to provide you with exceptional heart care, we have created designated Provider Care Teams.  These Care Teams include your primary Cardiologist (physician) and Advanced Practice Providers (APPs -  Physician Assistants and Nurse Practitioners) who all work together to provide you with the care you need, when you need it.  We recommend signing up for the patient portal called "MyChart".  Sign up information is provided on this After Visit Summary.  MyChart is used to connect with patients for Virtual Visits (Telemedicine).  Patients are able to view lab/test results, encounter notes, upcoming appointments, etc.  Non-urgent messages can be sent to your provider as well.   To learn more about what you can do with MyChart, go to [email or web address].    Your next appointment:   3 month(s)  The format for your next appointment:   In Person  Provider:   Berniece Salines, DO   Other Instructions NA

## 2020-03-31 NOTE — Progress Notes (Signed)
Cardiology Office Note:    Date:  03/31/2020   ID:  Heather Mccoy, DOB May 27, 1945, MRN [id number]  PCP:  Lind Guest, NP  Cardiologist:  Berniece Salines, DO  Electrophysiologist:  None   Referring MD: Lind Guest, NP    History of Present Illness:    Heather Mccoy is a 74 y.o. female with a hx of diabetes mellitus, colon cancer status post surgery, chronic diarrhea, anxiety, COPD not on oxygen, tobacco use, hypertension, CAD seen on Coronary CTAand hyperlipidemiapresents for follow-up visit.  I initially saw the patient onSeptember 4, 2020 at which time she was posthospitalization. She was hospitalized at [facility] in August where she was treated for syncope due to orthostatic hypotension. Her medications were optimized which included stopping her diuretics and she was started on midodrine and Florinef.On completion of our visit given her elevated blood pressure I did advise patient to only take her midodrine 5 mg 3 times daily if her systolic blood pressure was less than 120. Since her visit she has not needed to take the midodrine. She was also given a ZIO patch which she is still wearing to complete a 14-day monitoring. She was continued on the fludrocortisone 0.1 mg daily  The patient was seenon 02/05/2019.During that visit she was hypertensive and was still onfludrocortisone 0.1 mg daily. Started to titrate off the fludrocortisone. In addition the patient at the time did not want to start any antihypertensive medication. She was still wearing her Zio patch during the time of her visit.At her last visit October 14,2020 patient did have evidence of paroxysmal atrial tachycardiashe was started on carvedilol 6.25 mg twice daily.  He was seen on April 02, 2019 at which time she reported intermittent chest pain she described as left-sided burning sensation which radiated to her left arm and also caused left arm numbness. During her visit we discussed  getting a CTA coronaries.However at the conclusion of her visit the patient was being wanted checked out she did have an episode where she felt significantly lightheaded and was guided to the floor without falling or passing out.The patient was taken to [facility] at that time thought to be vasovagal syncope.   On April 16, 2019 I saw the patient in follow-up visit and she is still having some chest pain but tells me it did respond to her Imdur, and herCCTA was scheduled.  I did see the patient on 07/13/19 to discuss her CCTA results which showed evidence of coronay artery disease. She was started on renaxa 500mg  BID. She was also hypertensive, thereforeI increased her carvedilol to 12.5 mg twice daily.  In March 2021, [facility] interimdue to left facial droop and weakness. She was diagnosed with bells palsy. She has been experiencing worsening diarrhea - she does have chronic diarrhea but she notes that this has gotten worse.  At her visit on July 31, 2019 at that time increase her Ranexa to 1000 mg twice a day. In the interim she was admitted to the [facility] for pneumonia and was placed on antibiotic for a while.  On Sep 18, 2019 at that time she was experiencing some dizziness which sounded like vertigo referred the patient to ENT.  She also was hypotensive therefore I decreased hydralazine.  At her October 17, 2019 visit which was her most recent visit her dizziness has improved significantly since her blood pressure medication was also changed.  She offers no complaints at that  time she was continue on that regimen.  In June 2021 her dizziness had improved so I therefore continued her medication regimen.    On January 17, 2020 the patient presented with chest pain for which I placed the patient back on Ranexa.  At that time there was no dizziness.    In the interim the patient presented for a vascular ultrasound and was  noted to have be dizzy I manually did her blood pressure was positive orthostatic.  We therefore stopped her Lasix and started patient on midodrine 5 mg every 8 hours.  The patient was admitted at the [facility] on March 02, 2020 and discharged on October 21 during her hospitalization she presented due to unsteady gait with possible ataxia she was seen by neurology imaging for CT head and MRI were both unremarkable.  She did have positive orthostatic vitals.  She was seen by me colleagues in the [facility] at which time ranolazine, carvedilol, amlodipine and hydralazine were discontinued.  She had not started her midodrine therefore at that time midodrine 5 mg every 8 hours was started and she was also started on flecainide twice daily.  The patient was given compression stockings and while in the hospital her QTC were followed.  She is here today for follow-up visit.  Past Medical History:  Diagnosis Date   Anxiety    Aortic valve sclerosis    Arthritis    Bell's palsy 07/31/2019   Cancer (HCC)    colon   Chronic diarrhea of unknown origin 01/12/2012   COPD (chronic obstructive pulmonary disease) (HCC)    Coronary artery disease of native artery of native heart with stable angina pectoris ([place]) 06/11/2019   Diabetes mellitus    Dizziness 09/18/2019   Dysrhythmia    Epigastric pain    Essential hypertension 06/11/2019   Hyperlipemia 01/19/2019   Hyperlipidemia    Hypertension    Mixed hyperlipidemia 06/11/2019   Mood disorder (HCC)    Nausea and vomiting    Normocytic anemia 09/12/2014   Orthostatic dizziness 03/03/2020   Orthostatic hypotension    Paroxysmal atrial tachycardia (HCC) 06/11/2019   Polypharmacy    Shortness of breath    Type II diabetes mellitus ([place]) 01/19/2019   Unsteady gait    Vitamin B12 deficiency 09/13/2014   Weight loss 09/12/2014    Past Surgical History:  Procedure Laterality Date   APPENDECTOMY     BACK SURGERY      CHOLECYSTECTOMY     COLON SURGERY     EUS  01/12/2012   Procedure: UPPER ENDOSCOPIC ULTRASOUND (EUS) RADIAL;  Surgeon: Arta Silence, MD;  Location: [facility];  Service: Endoscopy;  Laterality: N/A;  to be admitted after   HAND SURGERY     REPLACEMENT TOTAL KNEE      Current Medications: Current Meds  Medication Sig   alendronate (FOSAMAX) 70 MG tablet Take 70 mg by mouth every Sunday. Take with a full glass of water on an empty stomach.    aspirin EC 81 MG tablet Take 81 mg by mouth daily.   atorvastatin (LIPITOR) 40 MG tablet Take 40 mg by mouth daily.   citalopram (CELEXA) 10 MG tablet Take 10 mg by mouth daily.   dicyclomine (BENTYL) 20 MG tablet Take 20 mg by mouth 3 (three) times daily.   diphenoxylate-atropine (LOMOTIL) 2.5-0.025 MG tablet Take 2 tablets by mouth 4 (four) times daily.   famotidine (PEPCID) 40 MG tablet Take 40 mg by mouth daily.  FEROSUL 325 (65 Fe) MG tablet Take 325 mg by mouth daily.   flecainide (TAMBOCOR) 50 MG tablet Take 1 tablet (50 mg total) by mouth every 12 (twelve) hours.   lidocaine (LIDODERM) 5 % Place 1 patch onto the skin daily. Remove & Discard patch within 12 hours or as directed by MD   loperamide (IMODIUM) 2 MG capsule Take 2 mg by mouth every 6 (six) hours as needed for diarrhea or loose stools.   metoprolol succinate (TOPROL-XL) 25 MG 24 hr tablet Take 0.5 tablets (12.5 mg total) by mouth daily.   midodrine (PROAMATINE) 5 MG tablet Take 1 tablet (5 mg total) by mouth 3 (three) times daily with meals.   montelukast (SINGULAIR) 10 MG tablet TAKE 1 TABLET BY MOUTH AT BEDTIME   ondansetron (ZOFRAN-ODT) 4 MG disintegrating tablet Take 4 mg by mouth every 4 (four) hours as needed for nausea.    pantoprazole (PROTONIX) 40 MG tablet Take 40 mg by mouth daily.   potassium chloride SA (KLOR-CON) 20 MEQ tablet Take 20 mEq by mouth daily.   traMADol (ULTRAM) 50 MG tablet Take 50 mg by mouth every 6 (six) hours as needed for  moderate pain.      Allergies:   Codeine, Morphine, Nitroglycerin, Tylenol [acetaminophen], and Oxycodone   Social History   Socioeconomic History   Marital status: Widowed    Spouse name: Not on file   Number of children: Not on file   Years of education: Not on file   Highest education level: Not on file  Occupational History   Not on file  Tobacco Use   Smoking status: Current Every Day Smoker    Packs/day: 0.50    Types: Cigarettes   Smokeless tobacco: Never Used  Vaping Use   Vaping Use: Never used  Substance and Sexual Activity   Alcohol use: No   Drug use: No   Sexual activity: Never  Other Topics Concern   Not on file  Social History Narrative   Not on file   Social Determinants of Health   Financial Resource Strain:    Difficulty of Paying Living Expenses: Not on file  Food Insecurity:    Worried About [place] in the Last Year: Not on file   Ran Out of Food in the Last Year: Not on file  Transportation Needs:    Lack of Transportation (Medical): Not on file   Lack of Transportation (Non-Medical): Not on file  Physical Activity:    Days of Exercise per Week: Not on file   Minutes of Exercise per Session: Not on file  Stress:    Feeling of Stress : Not on file  Social Connections:    Frequency of Communication with Friends and Family: Not on file   Frequency of Social Gatherings with Friends and Family: Not on file   Attends Religious Services: Not on file   Active Member of Clubs or Organizations: Not on file   Attends Archivist Meetings: Not on file   Marital Status: Not on file     Family History: The patient's family history includes CVA in her father; Cerebral aneurysm in her mother; Heart attack in her father and sister.  ROS:   Review of Systems  Constitution: Negative for decreased appetite, fever and weight gain.  HENT: Negative for congestion, ear discharge, hoarse voice and sore throat.     Eyes: Negative for discharge, redness, vision loss in right eye and visual halos.  Cardiovascular: Negative  for chest pain, dyspnea on exertion, leg swelling, orthopnea and palpitations.  Respiratory: Negative for cough, hemoptysis, shortness of breath and snoring.   Endocrine: Negative for heat intolerance and polyphagia.  Hematologic/Lymphatic: Negative for bleeding problem. Does not bruise/bleed easily.  Skin: Negative for flushing, nail changes, rash and suspicious lesions.  Musculoskeletal: Negative for arthritis, joint pain, muscle cramps, myalgias, neck pain and stiffness.  Gastrointestinal: Negative for abdominal pain, bowel incontinence, diarrhea and excessive appetite.  Genitourinary: Negative for decreased libido, genital sores and incomplete emptying.  Neurological: Negative for brief paralysis, focal weakness, headaches and loss of balance.  Psychiatric/Behavioral: Negative for altered mental status, depression and suicidal ideas.  Allergic/Immunologic: Negative for HIV exposure and persistent infections.    EKGs/Labs/Other Studies Reviewed:    The following studies were reviewed today:   EKG:  The ekg ordered today demonstrates sinus bradycardia, heart rate 52 bpm QTC 461.  Recent Labs: 03/02/2020: B Natriuretic Peptide 109.0 03/03/2020: Magnesium 1.6 03/05/2020: ALT 17 03/06/2020: BUN 13; Creatinine, Ser 0.93; Hemoglobin 11.3; Platelets 162; Potassium 4.1; Sodium 139  Recent Lipid Panel    Component Value Date/Time   CHOL 70 03/03/2020 0436   TRIG 68 03/03/2020 0436   HDL 36 (L) 03/03/2020 0436   CHOLHDL 1.9 03/03/2020 0436   VLDL 14 03/03/2020 0436   LDLCALC 20 03/03/2020 0436    Physical Exam:    VS:  Ht 5\' 2"  (1.575 m)    Wt 138 lb 6.4 oz (62.8 kg)    SpO2 95%    BMI 25.31 kg/m     Wt Readings from Last 3 Encounters:  03/31/20 138 lb 6.4 oz (62.8 kg)  03/02/20 135 lb 12.9 oz (61.6 kg)  01/17/20 135 lb 12.8 oz (61.6 kg)     GEN: Well nourished, well  developed in no acute distress HEENT: Normal NECK: No JVD; No carotid bruits LYMPHATICS: No lymphadenopathy CARDIAC: S1S2 noted,RRR, no murmurs, rubs, gallops RESPIRATORY:  Clear to auscultation without rales, wheezing or rhonchi  ABDOMEN: Soft, non-tender, non-distended, +bowel sounds, no guarding. EXTREMITIES: No edema, No cyanosis, no clubbing MUSCULOSKELETAL:  No deformity  SKIN: Warm and dry NEUROLOGIC:  Alert and oriented x 3, non-focal PSYCHIATRIC:  Normal affect, good insight  ASSESSMENT:    1. Orthostatic hypotension   2. Orthostatic dizziness   3. Paroxysmal atrial tachycardia ([place])   4. Coronary artery disease of native artery of native heart with stable angina pectoris ([place])   5. Dizziness    PLAN:    Her dizziness is improving some not completely gone.  She does have symptomatic orthostatic hypotension however she knows that this is occasional.  Unfortunately she had not been able to try the lidocaine patch given that this medication is very pricey for the patient.  I have encouraged her to try getting this at [facility] which she is going to switch pharmacy.  She also has not used her compression stockings I did encourage the patient that it would be in her best interest to use her compression stockings and explained to her how this will be helpful.  No changes will be made to her medication regimen.  We will get flecainide level BMP and mag today.   The patient is in agreement with the above plan. The patient left the office in stable condition.  The patient will follow up in 3 months or sooner if needed.   Medication Adjustments/Labs and Tests Ordered: Current medicines are reviewed at length with the patient today.  Concerns regarding medicines are  outlined above.  No orders of the defined types were placed in this encounter.  No orders of the defined types were placed in this encounter.   Patient Instructions  s    Adopting a Healthy Lifestyle.  Know what a  healthy weight is for you (roughly BMI <25) and aim to maintain this   Aim for 7+ servings of fruits and vegetables daily   65-80+ fluid ounces of water or unsweet tea for healthy kidneys   Limit to max 1 drink of alcohol per day; avoid smoking/tobacco   Limit animal fats in diet for cholesterol and heart health - choose grass fed whenever available   Avoid highly processed foods, and foods high in saturated/trans fats   Aim for low stress - take time to unwind and care for your mental health   Aim for 150 min of moderate intensity exercise weekly for heart health, and weights twice weekly for bone health   Aim for 7-9 hours of sleep daily   When it comes to diets, agreement about the perfect plan isnt easy to find, even among the experts. Experts at the [place] developed an idea known as the Healthy Eating Plate. Just imagine a plate divided into logical, healthy portions.   The emphasis is on diet quality:   Load up on vegetables and fruits - one-half of your plate: Aim for color and variety, and remember that potatoes dont count.   Go for whole grains - one-quarter of your plate: Whole wheat, barley, wheat berries, quinoa, oats, brown rice, and foods made with them. If you want pasta, go with whole wheat pasta.   Protein power - one-quarter of your plate: Fish, chicken, beans, and nuts are all healthy, versatile protein sources. Limit red meat.   The diet, however, does go beyond the plate, offering a few other suggestions.   Use healthy plant oils, such as olive, canola, soy, corn, sunflower and peanut. Check the labels, and avoid partially hydrogenated oil, which have unhealthy trans fats.   If youre thirsty, drink water. Coffee and tea are good in moderation, but skip sugary drinks and limit milk and dairy products to one or two daily servings.   The type of carbohydrate in the diet is more important than the amount. Some sources of carbohydrates,  such as vegetables, fruits, whole grains, and beans-are healthier than others.   Finally, stay active  Signed, Berniece Salines, DO  03/31/2020 10:30 AM    [place]

## 2020-04-01 DIAGNOSIS — I5032 Chronic diastolic (congestive) heart failure: Secondary | ICD-10-CM | POA: Diagnosis not present

## 2020-04-01 DIAGNOSIS — G43909 Migraine, unspecified, not intractable, without status migrainosus: Secondary | ICD-10-CM | POA: Diagnosis not present

## 2020-04-01 DIAGNOSIS — G510 Bell's palsy: Secondary | ICD-10-CM | POA: Diagnosis not present

## 2020-04-01 DIAGNOSIS — H547 Unspecified visual loss: Secondary | ICD-10-CM | POA: Diagnosis not present

## 2020-04-01 DIAGNOSIS — M4302 Spondylolysis, cervical region: Secondary | ICD-10-CM | POA: Diagnosis not present

## 2020-04-01 DIAGNOSIS — I081 Rheumatic disorders of both mitral and tricuspid valves: Secondary | ICD-10-CM | POA: Diagnosis not present

## 2020-04-01 DIAGNOSIS — Z85038 Personal history of other malignant neoplasm of large intestine: Secondary | ICD-10-CM | POA: Diagnosis not present

## 2020-04-01 DIAGNOSIS — E119 Type 2 diabetes mellitus without complications: Secondary | ICD-10-CM | POA: Diagnosis not present

## 2020-04-01 DIAGNOSIS — E278 Other specified disorders of adrenal gland: Secondary | ICD-10-CM | POA: Diagnosis not present

## 2020-04-01 DIAGNOSIS — J449 Chronic obstructive pulmonary disease, unspecified: Secondary | ICD-10-CM | POA: Diagnosis not present

## 2020-04-01 DIAGNOSIS — Z7982 Long term (current) use of aspirin: Secondary | ICD-10-CM | POA: Diagnosis not present

## 2020-04-01 DIAGNOSIS — F17210 Nicotine dependence, cigarettes, uncomplicated: Secondary | ICD-10-CM | POA: Diagnosis not present

## 2020-04-01 DIAGNOSIS — Z87440 Personal history of urinary (tract) infections: Secondary | ICD-10-CM | POA: Diagnosis not present

## 2020-04-01 DIAGNOSIS — K219 Gastro-esophageal reflux disease without esophagitis: Secondary | ICD-10-CM | POA: Diagnosis not present

## 2020-04-01 DIAGNOSIS — I110 Hypertensive heart disease with heart failure: Secondary | ICD-10-CM | POA: Diagnosis not present

## 2020-04-01 DIAGNOSIS — M5030 Other cervical disc degeneration, unspecified cervical region: Secondary | ICD-10-CM | POA: Diagnosis not present

## 2020-04-01 DIAGNOSIS — M19012 Primary osteoarthritis, left shoulder: Secondary | ICD-10-CM | POA: Diagnosis not present

## 2020-04-01 DIAGNOSIS — M179 Osteoarthritis of knee, unspecified: Secondary | ICD-10-CM | POA: Diagnosis not present

## 2020-04-01 DIAGNOSIS — Z9181 History of falling: Secondary | ICD-10-CM | POA: Diagnosis not present

## 2020-04-01 DIAGNOSIS — E785 Hyperlipidemia, unspecified: Secondary | ICD-10-CM | POA: Diagnosis not present

## 2020-04-01 DIAGNOSIS — D649 Anemia, unspecified: Secondary | ICD-10-CM | POA: Diagnosis not present

## 2020-04-01 DIAGNOSIS — I700 Atherosclerosis of aorta: Secondary | ICD-10-CM | POA: Diagnosis not present

## 2020-04-01 DIAGNOSIS — K580 Irritable bowel syndrome with diarrhea: Secondary | ICD-10-CM | POA: Diagnosis not present

## 2020-04-02 ENCOUNTER — Telehealth: Payer: Self-pay

## 2020-04-02 DIAGNOSIS — F17210 Nicotine dependence, cigarettes, uncomplicated: Secondary | ICD-10-CM | POA: Diagnosis not present

## 2020-04-02 DIAGNOSIS — M5030 Other cervical disc degeneration, unspecified cervical region: Secondary | ICD-10-CM | POA: Diagnosis not present

## 2020-04-02 DIAGNOSIS — D649 Anemia, unspecified: Secondary | ICD-10-CM | POA: Diagnosis not present

## 2020-04-02 DIAGNOSIS — H547 Unspecified visual loss: Secondary | ICD-10-CM | POA: Diagnosis not present

## 2020-04-02 DIAGNOSIS — G43909 Migraine, unspecified, not intractable, without status migrainosus: Secondary | ICD-10-CM | POA: Diagnosis not present

## 2020-04-02 DIAGNOSIS — Z9181 History of falling: Secondary | ICD-10-CM | POA: Diagnosis not present

## 2020-04-02 DIAGNOSIS — M19012 Primary osteoarthritis, left shoulder: Secondary | ICD-10-CM | POA: Diagnosis not present

## 2020-04-02 DIAGNOSIS — E785 Hyperlipidemia, unspecified: Secondary | ICD-10-CM | POA: Diagnosis not present

## 2020-04-02 DIAGNOSIS — M179 Osteoarthritis of knee, unspecified: Secondary | ICD-10-CM | POA: Diagnosis not present

## 2020-04-02 DIAGNOSIS — Z7982 Long term (current) use of aspirin: Secondary | ICD-10-CM | POA: Diagnosis not present

## 2020-04-02 DIAGNOSIS — K219 Gastro-esophageal reflux disease without esophagitis: Secondary | ICD-10-CM | POA: Diagnosis not present

## 2020-04-02 DIAGNOSIS — I700 Atherosclerosis of aorta: Secondary | ICD-10-CM | POA: Diagnosis not present

## 2020-04-02 DIAGNOSIS — E278 Other specified disorders of adrenal gland: Secondary | ICD-10-CM | POA: Diagnosis not present

## 2020-04-02 DIAGNOSIS — K580 Irritable bowel syndrome with diarrhea: Secondary | ICD-10-CM | POA: Diagnosis not present

## 2020-04-02 DIAGNOSIS — Z85038 Personal history of other malignant neoplasm of large intestine: Secondary | ICD-10-CM | POA: Diagnosis not present

## 2020-04-02 DIAGNOSIS — M4302 Spondylolysis, cervical region: Secondary | ICD-10-CM | POA: Diagnosis not present

## 2020-04-02 DIAGNOSIS — I110 Hypertensive heart disease with heart failure: Secondary | ICD-10-CM | POA: Diagnosis not present

## 2020-04-02 DIAGNOSIS — I5032 Chronic diastolic (congestive) heart failure: Secondary | ICD-10-CM | POA: Diagnosis not present

## 2020-04-02 DIAGNOSIS — Z87440 Personal history of urinary (tract) infections: Secondary | ICD-10-CM | POA: Diagnosis not present

## 2020-04-02 DIAGNOSIS — I081 Rheumatic disorders of both mitral and tricuspid valves: Secondary | ICD-10-CM | POA: Diagnosis not present

## 2020-04-02 DIAGNOSIS — J449 Chronic obstructive pulmonary disease, unspecified: Secondary | ICD-10-CM | POA: Diagnosis not present

## 2020-04-02 DIAGNOSIS — E119 Type 2 diabetes mellitus without complications: Secondary | ICD-10-CM | POA: Diagnosis not present

## 2020-04-02 DIAGNOSIS — G510 Bell's palsy: Secondary | ICD-10-CM | POA: Diagnosis not present

## 2020-04-02 LAB — BASIC METABOLIC PANEL
BUN/Creatinine Ratio: 19 (ref 12–28)
BUN: 15 mg/dL (ref 8–27)
CO2: 24 mmol/L (ref 20–29)
Calcium: 9.3 mg/dL (ref 8.7–10.3)
Chloride: 107 mmol/L — ABNORMAL HIGH (ref 96–106)
Creatinine, Ser: 0.77 mg/dL (ref 0.57–1.00)
GFR calc Af Amer: 89 mL/min/{1.73_m2} (ref >59)
GFR calc non Af Amer: 77 mL/min/{1.73_m2} (ref >59)
Glucose: 96 mg/dL (ref 65–99)
Potassium: 4.7 mmol/L (ref 3.5–5.2)
Sodium: 142 mmol/L (ref 134–144)

## 2020-04-02 LAB — MAGNESIUM: Magnesium: 1.8 mg/dL (ref 1.6–2.3)

## 2020-04-02 LAB — FLECAINIDE LEVEL: Flecainide: 0.35 ug/mL (ref 0.20–1.00)

## 2020-04-02 NOTE — Telephone Encounter (Signed)
-----   Message from Berniece Salines, DO sent at 04/02/2020  9:04 AM EST ----- Stable labs.

## 2020-04-02 NOTE — Telephone Encounter (Signed)
Left message on patients voicemail to please return our call.   

## 2020-04-03 ENCOUNTER — Ambulatory Visit (INDEPENDENT_AMBULATORY_CARE_PROVIDER_SITE_OTHER): Payer: Medicare Other

## 2020-04-03 ENCOUNTER — Telehealth: Payer: Self-pay

## 2020-04-03 ENCOUNTER — Encounter: Payer: Self-pay | Admitting: Podiatry

## 2020-04-03 ENCOUNTER — Other Ambulatory Visit: Payer: Self-pay

## 2020-04-03 ENCOUNTER — Ambulatory Visit (INDEPENDENT_AMBULATORY_CARE_PROVIDER_SITE_OTHER): Payer: Medicare Other | Admitting: Podiatry

## 2020-04-03 DIAGNOSIS — S90852A Superficial foreign body, left foot, initial encounter: Secondary | ICD-10-CM

## 2020-04-03 DIAGNOSIS — M79672 Pain in left foot: Secondary | ICD-10-CM | POA: Diagnosis not present

## 2020-04-03 NOTE — Telephone Encounter (Signed)
-----   Message from Berniece Salines, DO sent at 04/02/2020  9:04 AM EST ----- Stable labs.

## 2020-04-03 NOTE — Telephone Encounter (Signed)
Left message on patients voicemail to please return our call.   

## 2020-04-04 ENCOUNTER — Telehealth: Payer: Self-pay

## 2020-04-04 NOTE — Telephone Encounter (Signed)
Left message on patients voicemail to please return our call.   I will mail the patient a letter at this time as we have tried to reach her x3 with no success.  

## 2020-04-04 NOTE — Telephone Encounter (Signed)
-----   Message from Berniece Salines, DO sent at 04/02/2020  9:04 AM EST ----- Stable labs.

## 2020-04-08 ENCOUNTER — Encounter: Payer: Self-pay | Admitting: Emergency Medicine

## 2020-04-08 DIAGNOSIS — M4302 Spondylolysis, cervical region: Secondary | ICD-10-CM | POA: Diagnosis not present

## 2020-04-08 DIAGNOSIS — E119 Type 2 diabetes mellitus without complications: Secondary | ICD-10-CM | POA: Diagnosis not present

## 2020-04-08 DIAGNOSIS — M179 Osteoarthritis of knee, unspecified: Secondary | ICD-10-CM | POA: Diagnosis not present

## 2020-04-08 DIAGNOSIS — I081 Rheumatic disorders of both mitral and tricuspid valves: Secondary | ICD-10-CM | POA: Diagnosis not present

## 2020-04-08 DIAGNOSIS — E278 Other specified disorders of adrenal gland: Secondary | ICD-10-CM | POA: Diagnosis not present

## 2020-04-08 DIAGNOSIS — K580 Irritable bowel syndrome with diarrhea: Secondary | ICD-10-CM | POA: Diagnosis not present

## 2020-04-08 DIAGNOSIS — Z85038 Personal history of other malignant neoplasm of large intestine: Secondary | ICD-10-CM | POA: Diagnosis not present

## 2020-04-08 DIAGNOSIS — D649 Anemia, unspecified: Secondary | ICD-10-CM | POA: Diagnosis not present

## 2020-04-08 DIAGNOSIS — Z9181 History of falling: Secondary | ICD-10-CM | POA: Diagnosis not present

## 2020-04-08 DIAGNOSIS — H547 Unspecified visual loss: Secondary | ICD-10-CM | POA: Diagnosis not present

## 2020-04-08 DIAGNOSIS — J449 Chronic obstructive pulmonary disease, unspecified: Secondary | ICD-10-CM | POA: Diagnosis not present

## 2020-04-08 DIAGNOSIS — M5030 Other cervical disc degeneration, unspecified cervical region: Secondary | ICD-10-CM | POA: Diagnosis not present

## 2020-04-08 DIAGNOSIS — Z87440 Personal history of urinary (tract) infections: Secondary | ICD-10-CM | POA: Diagnosis not present

## 2020-04-08 DIAGNOSIS — G510 Bell's palsy: Secondary | ICD-10-CM | POA: Diagnosis not present

## 2020-04-08 DIAGNOSIS — I110 Hypertensive heart disease with heart failure: Secondary | ICD-10-CM | POA: Diagnosis not present

## 2020-04-08 DIAGNOSIS — I700 Atherosclerosis of aorta: Secondary | ICD-10-CM | POA: Diagnosis not present

## 2020-04-08 DIAGNOSIS — M19012 Primary osteoarthritis, left shoulder: Secondary | ICD-10-CM | POA: Diagnosis not present

## 2020-04-08 DIAGNOSIS — I5032 Chronic diastolic (congestive) heart failure: Secondary | ICD-10-CM | POA: Diagnosis not present

## 2020-04-08 DIAGNOSIS — Z7982 Long term (current) use of aspirin: Secondary | ICD-10-CM | POA: Diagnosis not present

## 2020-04-08 DIAGNOSIS — E785 Hyperlipidemia, unspecified: Secondary | ICD-10-CM | POA: Diagnosis not present

## 2020-04-08 DIAGNOSIS — K219 Gastro-esophageal reflux disease without esophagitis: Secondary | ICD-10-CM | POA: Diagnosis not present

## 2020-04-08 DIAGNOSIS — F17210 Nicotine dependence, cigarettes, uncomplicated: Secondary | ICD-10-CM | POA: Diagnosis not present

## 2020-04-08 DIAGNOSIS — G43909 Migraine, unspecified, not intractable, without status migrainosus: Secondary | ICD-10-CM | POA: Diagnosis not present

## 2020-04-08 NOTE — Telephone Encounter (Signed)
Attempted to call patient again no answer. Due to failed attempts will send letter for patient to call office.

## 2020-04-16 NOTE — Progress Notes (Signed)
  Subjective:  Patient ID: Heather Mccoy, female    DOB: 1946/01/16,  MRN: [id number]  Chief Complaint  Patient presents with  . Foot Problem    i have a piece of glass in my left foot and has been there 3 weeks   . Nail Problem    two big toenails are hurting     74 y.o. female presents with the above complaint. History confirmed with patient.   Objective:  Physical Exam: warm, good capillary refill, no trophic changes or ulcerative lesions, normal DP and PT pulses and normal sensory exam. Left Foot: POP left plantar foot with small lesion but no evident foreign body upon debridement.    No images are attached to the encounter.  Radiographs: X-ray of the left foot: no fracture, dislocation, swelling or degenerative changes noted Assessment:   1. Foreign body in left foot, initial encounter   2. Pain in left foot    Plan:  Patient was evaluated and treated and all questions answered.  Foreign body left foot -Attempted debridement to patient tolerance no foreign body evident. -Will order [place] as patient still has pain. -Likely will need OR removal if FB present. -Ointment and band-aid to lesion daily. -F/u in 2 weeks  Return in about 2 weeks (around 04/17/2020) for ultrasound f/u.

## 2020-04-17 ENCOUNTER — Ambulatory Visit (INDEPENDENT_AMBULATORY_CARE_PROVIDER_SITE_OTHER): Payer: Medicare Other | Admitting: Podiatry

## 2020-04-17 ENCOUNTER — Encounter: Payer: Self-pay | Admitting: Podiatry

## 2020-04-17 ENCOUNTER — Other Ambulatory Visit: Payer: Self-pay

## 2020-04-17 DIAGNOSIS — F17210 Nicotine dependence, cigarettes, uncomplicated: Secondary | ICD-10-CM | POA: Diagnosis not present

## 2020-04-17 DIAGNOSIS — M79672 Pain in left foot: Secondary | ICD-10-CM | POA: Diagnosis not present

## 2020-04-17 DIAGNOSIS — I5032 Chronic diastolic (congestive) heart failure: Secondary | ICD-10-CM | POA: Diagnosis not present

## 2020-04-17 DIAGNOSIS — G43909 Migraine, unspecified, not intractable, without status migrainosus: Secondary | ICD-10-CM | POA: Diagnosis not present

## 2020-04-17 DIAGNOSIS — I110 Hypertensive heart disease with heart failure: Secondary | ICD-10-CM | POA: Diagnosis not present

## 2020-04-17 DIAGNOSIS — E785 Hyperlipidemia, unspecified: Secondary | ICD-10-CM | POA: Diagnosis not present

## 2020-04-17 DIAGNOSIS — K580 Irritable bowel syndrome with diarrhea: Secondary | ICD-10-CM | POA: Diagnosis not present

## 2020-04-17 DIAGNOSIS — I081 Rheumatic disorders of both mitral and tricuspid valves: Secondary | ICD-10-CM | POA: Diagnosis not present

## 2020-04-17 DIAGNOSIS — H547 Unspecified visual loss: Secondary | ICD-10-CM | POA: Diagnosis not present

## 2020-04-17 DIAGNOSIS — E278 Other specified disorders of adrenal gland: Secondary | ICD-10-CM | POA: Diagnosis not present

## 2020-04-17 DIAGNOSIS — M4302 Spondylolysis, cervical region: Secondary | ICD-10-CM | POA: Diagnosis not present

## 2020-04-17 DIAGNOSIS — E119 Type 2 diabetes mellitus without complications: Secondary | ICD-10-CM | POA: Diagnosis not present

## 2020-04-17 DIAGNOSIS — M5030 Other cervical disc degeneration, unspecified cervical region: Secondary | ICD-10-CM | POA: Diagnosis not present

## 2020-04-17 DIAGNOSIS — M179 Osteoarthritis of knee, unspecified: Secondary | ICD-10-CM | POA: Diagnosis not present

## 2020-04-17 DIAGNOSIS — J449 Chronic obstructive pulmonary disease, unspecified: Secondary | ICD-10-CM | POA: Diagnosis not present

## 2020-04-17 DIAGNOSIS — G510 Bell's palsy: Secondary | ICD-10-CM | POA: Diagnosis not present

## 2020-04-17 DIAGNOSIS — Z85038 Personal history of other malignant neoplasm of large intestine: Secondary | ICD-10-CM | POA: Diagnosis not present

## 2020-04-17 DIAGNOSIS — K219 Gastro-esophageal reflux disease without esophagitis: Secondary | ICD-10-CM | POA: Diagnosis not present

## 2020-04-17 DIAGNOSIS — Z7982 Long term (current) use of aspirin: Secondary | ICD-10-CM | POA: Diagnosis not present

## 2020-04-17 DIAGNOSIS — Z87440 Personal history of urinary (tract) infections: Secondary | ICD-10-CM | POA: Diagnosis not present

## 2020-04-17 DIAGNOSIS — S90852D Superficial foreign body, left foot, subsequent encounter: Secondary | ICD-10-CM | POA: Diagnosis not present

## 2020-04-17 DIAGNOSIS — Z9181 History of falling: Secondary | ICD-10-CM | POA: Diagnosis not present

## 2020-04-17 DIAGNOSIS — M19012 Primary osteoarthritis, left shoulder: Secondary | ICD-10-CM | POA: Diagnosis not present

## 2020-04-17 DIAGNOSIS — D649 Anemia, unspecified: Secondary | ICD-10-CM | POA: Diagnosis not present

## 2020-04-17 DIAGNOSIS — I700 Atherosclerosis of aorta: Secondary | ICD-10-CM | POA: Diagnosis not present

## 2020-04-17 NOTE — Telephone Encounter (Signed)
    Pt received the letter and gave her correct number. She said call her anytime for her results

## 2020-04-17 NOTE — Progress Notes (Signed)
  Subjective:  Patient ID: Heather Mccoy, female    DOB: February 21, 1946,  MRN: [id number]  Chief Complaint  Patient presents with  . Foot Problem    i am here to get the ultra sound done and the pain has moved     74 y.o. female presents with the above complaint. History confirmed with patient.   Objective:  Physical Exam: warm, good capillary refill, no trophic changes or ulcerative lesions, normal DP and PT pulses and normal sensory exam. Left Foot: POP left plantar foot with small lesion but no evident foreign body upon debridement. Palpable prominence. Assessment:   1. Foreign body in left foot, subsequent encounter   2. Pain in left foot    Plan:  Patient was evaluated and treated and all questions answered.  Foreign body left foot -Pending ultrasound -There does appear to be a prominent area that may contain foreign body but it is healed over. Likely will need OR removal if FB present. -Ointment and band-aid to lesion daily. -F/u in 2 weeks  No follow-ups on file.

## 2020-04-21 DIAGNOSIS — S90852A Superficial foreign body, left foot, initial encounter: Secondary | ICD-10-CM | POA: Diagnosis not present

## 2020-04-21 DIAGNOSIS — Z0389 Encounter for observation for other suspected diseases and conditions ruled out: Secondary | ICD-10-CM | POA: Diagnosis not present

## 2020-04-23 DIAGNOSIS — G43909 Migraine, unspecified, not intractable, without status migrainosus: Secondary | ICD-10-CM | POA: Diagnosis not present

## 2020-04-23 DIAGNOSIS — Z85038 Personal history of other malignant neoplasm of large intestine: Secondary | ICD-10-CM | POA: Diagnosis not present

## 2020-04-23 DIAGNOSIS — I700 Atherosclerosis of aorta: Secondary | ICD-10-CM | POA: Diagnosis not present

## 2020-04-23 DIAGNOSIS — Z7982 Long term (current) use of aspirin: Secondary | ICD-10-CM | POA: Diagnosis not present

## 2020-04-23 DIAGNOSIS — K219 Gastro-esophageal reflux disease without esophagitis: Secondary | ICD-10-CM | POA: Diagnosis not present

## 2020-04-23 DIAGNOSIS — Z9181 History of falling: Secondary | ICD-10-CM | POA: Diagnosis not present

## 2020-04-23 DIAGNOSIS — F17210 Nicotine dependence, cigarettes, uncomplicated: Secondary | ICD-10-CM | POA: Diagnosis not present

## 2020-04-23 DIAGNOSIS — E785 Hyperlipidemia, unspecified: Secondary | ICD-10-CM | POA: Diagnosis not present

## 2020-04-23 DIAGNOSIS — D649 Anemia, unspecified: Secondary | ICD-10-CM | POA: Diagnosis not present

## 2020-04-23 DIAGNOSIS — M179 Osteoarthritis of knee, unspecified: Secondary | ICD-10-CM | POA: Diagnosis not present

## 2020-04-23 DIAGNOSIS — J449 Chronic obstructive pulmonary disease, unspecified: Secondary | ICD-10-CM | POA: Diagnosis not present

## 2020-04-23 DIAGNOSIS — E278 Other specified disorders of adrenal gland: Secondary | ICD-10-CM | POA: Diagnosis not present

## 2020-04-23 DIAGNOSIS — I5032 Chronic diastolic (congestive) heart failure: Secondary | ICD-10-CM | POA: Diagnosis not present

## 2020-04-23 DIAGNOSIS — I110 Hypertensive heart disease with heart failure: Secondary | ICD-10-CM | POA: Diagnosis not present

## 2020-04-23 DIAGNOSIS — M5030 Other cervical disc degeneration, unspecified cervical region: Secondary | ICD-10-CM | POA: Diagnosis not present

## 2020-04-23 DIAGNOSIS — G510 Bell's palsy: Secondary | ICD-10-CM | POA: Diagnosis not present

## 2020-04-23 DIAGNOSIS — K580 Irritable bowel syndrome with diarrhea: Secondary | ICD-10-CM | POA: Diagnosis not present

## 2020-04-23 DIAGNOSIS — M4302 Spondylolysis, cervical region: Secondary | ICD-10-CM | POA: Diagnosis not present

## 2020-04-23 DIAGNOSIS — H547 Unspecified visual loss: Secondary | ICD-10-CM | POA: Diagnosis not present

## 2020-04-23 DIAGNOSIS — E119 Type 2 diabetes mellitus without complications: Secondary | ICD-10-CM | POA: Diagnosis not present

## 2020-04-23 DIAGNOSIS — Z87440 Personal history of urinary (tract) infections: Secondary | ICD-10-CM | POA: Diagnosis not present

## 2020-04-23 DIAGNOSIS — I081 Rheumatic disorders of both mitral and tricuspid valves: Secondary | ICD-10-CM | POA: Diagnosis not present

## 2020-04-23 DIAGNOSIS — M19012 Primary osteoarthritis, left shoulder: Secondary | ICD-10-CM | POA: Diagnosis not present

## 2020-04-28 ENCOUNTER — Other Ambulatory Visit: Payer: Self-pay

## 2020-04-28 ENCOUNTER — Encounter: Payer: Self-pay | Admitting: Podiatry

## 2020-04-28 ENCOUNTER — Ambulatory Visit (INDEPENDENT_AMBULATORY_CARE_PROVIDER_SITE_OTHER): Payer: Medicare Other | Admitting: Podiatry

## 2020-04-28 DIAGNOSIS — S90852D Superficial foreign body, left foot, subsequent encounter: Secondary | ICD-10-CM

## 2020-04-28 DIAGNOSIS — M79672 Pain in left foot: Secondary | ICD-10-CM

## 2020-04-28 NOTE — Progress Notes (Signed)
  Subjective:  Patient ID: Heather Mccoy, female    DOB: 16-Jul-1945,  MRN: [id number]  Chief Complaint  Patient presents with  . Foot Problem    I went to have the ultra sound done and they stated that there was some glass in my left foot     74 y.o. female presents with the above complaint. History confirmed with patient. States the tech told her there were 2 pieces of glass in her foot.  Objective:  Physical Exam: warm, good capillary refill, no trophic changes or ulcerative lesions, normal DP and PT pulses and normal sensory exam. Left Foot: POP left plantar foot with small lesion but no evident foreign body upon debridement. No longer palpable promience Assessment:   1. Foreign body in left foot, subsequent encounter   2. Pain in left foot    Plan:  Patient was evaluated and treated and all questions answered.  Foreign body left foot -[place] reviewed, no evidence of FB. Personally reviewed and on my viewing no FB noted. -Will order MRI for definitive r/o of FB -F/u in 2 weeks after MRI  Return in about 2 weeks (around 05/12/2020) for MRI f/u.

## 2020-05-01 DIAGNOSIS — I081 Rheumatic disorders of both mitral and tricuspid valves: Secondary | ICD-10-CM | POA: Diagnosis not present

## 2020-05-01 DIAGNOSIS — D649 Anemia, unspecified: Secondary | ICD-10-CM | POA: Diagnosis not present

## 2020-05-01 DIAGNOSIS — Z87440 Personal history of urinary (tract) infections: Secondary | ICD-10-CM | POA: Diagnosis not present

## 2020-05-01 DIAGNOSIS — M179 Osteoarthritis of knee, unspecified: Secondary | ICD-10-CM | POA: Diagnosis not present

## 2020-05-01 DIAGNOSIS — I110 Hypertensive heart disease with heart failure: Secondary | ICD-10-CM | POA: Diagnosis not present

## 2020-05-01 DIAGNOSIS — M19012 Primary osteoarthritis, left shoulder: Secondary | ICD-10-CM | POA: Diagnosis not present

## 2020-05-01 DIAGNOSIS — M5030 Other cervical disc degeneration, unspecified cervical region: Secondary | ICD-10-CM | POA: Diagnosis not present

## 2020-05-01 DIAGNOSIS — F17210 Nicotine dependence, cigarettes, uncomplicated: Secondary | ICD-10-CM | POA: Diagnosis not present

## 2020-05-01 DIAGNOSIS — G43909 Migraine, unspecified, not intractable, without status migrainosus: Secondary | ICD-10-CM | POA: Diagnosis not present

## 2020-05-01 DIAGNOSIS — K580 Irritable bowel syndrome with diarrhea: Secondary | ICD-10-CM | POA: Diagnosis not present

## 2020-05-01 DIAGNOSIS — E785 Hyperlipidemia, unspecified: Secondary | ICD-10-CM | POA: Diagnosis not present

## 2020-05-01 DIAGNOSIS — K219 Gastro-esophageal reflux disease without esophagitis: Secondary | ICD-10-CM | POA: Diagnosis not present

## 2020-05-01 DIAGNOSIS — E119 Type 2 diabetes mellitus without complications: Secondary | ICD-10-CM | POA: Diagnosis not present

## 2020-05-01 DIAGNOSIS — E278 Other specified disorders of adrenal gland: Secondary | ICD-10-CM | POA: Diagnosis not present

## 2020-05-01 DIAGNOSIS — H547 Unspecified visual loss: Secondary | ICD-10-CM | POA: Diagnosis not present

## 2020-05-01 DIAGNOSIS — Z9181 History of falling: Secondary | ICD-10-CM | POA: Diagnosis not present

## 2020-05-01 DIAGNOSIS — Z7982 Long term (current) use of aspirin: Secondary | ICD-10-CM | POA: Diagnosis not present

## 2020-05-01 DIAGNOSIS — G510 Bell's palsy: Secondary | ICD-10-CM | POA: Diagnosis not present

## 2020-05-01 DIAGNOSIS — J449 Chronic obstructive pulmonary disease, unspecified: Secondary | ICD-10-CM | POA: Diagnosis not present

## 2020-05-01 DIAGNOSIS — I700 Atherosclerosis of aorta: Secondary | ICD-10-CM | POA: Diagnosis not present

## 2020-05-01 DIAGNOSIS — M4302 Spondylolysis, cervical region: Secondary | ICD-10-CM | POA: Diagnosis not present

## 2020-05-01 DIAGNOSIS — Z85038 Personal history of other malignant neoplasm of large intestine: Secondary | ICD-10-CM | POA: Diagnosis not present

## 2020-05-01 DIAGNOSIS — I5032 Chronic diastolic (congestive) heart failure: Secondary | ICD-10-CM | POA: Diagnosis not present

## 2020-05-02 ENCOUNTER — Telehealth: Payer: Self-pay | Admitting: *Deleted

## 2020-05-02 NOTE — Telephone Encounter (Signed)
Called and tried to leave a message stating that I was calling to let the patient know that she had an appointment for an MRI on 05/07/2020 at 9:30 am at [facility] and the voice mail is full and can not leave a message. Lattie Haw

## 2020-05-02 NOTE — Telephone Encounter (Signed)
Called and spoke with Seward Speck from [place] and stated that I was calling to see if [phone] procedure code needed a prior authorization and the representative stated that the patient did not need one and the reference number is K. Glennon Mac 05/02/2020 12:42 pm eastern standard time and I called [facility] to get the patient scheduled today. Lattie Haw

## 2020-05-05 DIAGNOSIS — K580 Irritable bowel syndrome with diarrhea: Secondary | ICD-10-CM | POA: Diagnosis not present

## 2020-05-05 DIAGNOSIS — J449 Chronic obstructive pulmonary disease, unspecified: Secondary | ICD-10-CM | POA: Diagnosis not present

## 2020-05-05 DIAGNOSIS — Z9181 History of falling: Secondary | ICD-10-CM | POA: Diagnosis not present

## 2020-05-05 DIAGNOSIS — Z7982 Long term (current) use of aspirin: Secondary | ICD-10-CM | POA: Diagnosis not present

## 2020-05-05 DIAGNOSIS — E785 Hyperlipidemia, unspecified: Secondary | ICD-10-CM | POA: Diagnosis not present

## 2020-05-05 DIAGNOSIS — G43909 Migraine, unspecified, not intractable, without status migrainosus: Secondary | ICD-10-CM | POA: Diagnosis not present

## 2020-05-05 DIAGNOSIS — G510 Bell's palsy: Secondary | ICD-10-CM | POA: Diagnosis not present

## 2020-05-05 DIAGNOSIS — H547 Unspecified visual loss: Secondary | ICD-10-CM | POA: Diagnosis not present

## 2020-05-05 DIAGNOSIS — M4302 Spondylolysis, cervical region: Secondary | ICD-10-CM | POA: Diagnosis not present

## 2020-05-05 DIAGNOSIS — F17210 Nicotine dependence, cigarettes, uncomplicated: Secondary | ICD-10-CM | POA: Diagnosis not present

## 2020-05-05 DIAGNOSIS — Z85038 Personal history of other malignant neoplasm of large intestine: Secondary | ICD-10-CM | POA: Diagnosis not present

## 2020-05-05 DIAGNOSIS — I110 Hypertensive heart disease with heart failure: Secondary | ICD-10-CM | POA: Diagnosis not present

## 2020-05-05 DIAGNOSIS — M19012 Primary osteoarthritis, left shoulder: Secondary | ICD-10-CM | POA: Diagnosis not present

## 2020-05-05 DIAGNOSIS — D649 Anemia, unspecified: Secondary | ICD-10-CM | POA: Diagnosis not present

## 2020-05-05 DIAGNOSIS — E119 Type 2 diabetes mellitus without complications: Secondary | ICD-10-CM | POA: Diagnosis not present

## 2020-05-05 DIAGNOSIS — I081 Rheumatic disorders of both mitral and tricuspid valves: Secondary | ICD-10-CM | POA: Diagnosis not present

## 2020-05-05 DIAGNOSIS — M5030 Other cervical disc degeneration, unspecified cervical region: Secondary | ICD-10-CM | POA: Diagnosis not present

## 2020-05-05 DIAGNOSIS — E278 Other specified disorders of adrenal gland: Secondary | ICD-10-CM | POA: Diagnosis not present

## 2020-05-05 DIAGNOSIS — M179 Osteoarthritis of knee, unspecified: Secondary | ICD-10-CM | POA: Diagnosis not present

## 2020-05-05 DIAGNOSIS — Z87440 Personal history of urinary (tract) infections: Secondary | ICD-10-CM | POA: Diagnosis not present

## 2020-05-05 DIAGNOSIS — K219 Gastro-esophageal reflux disease without esophagitis: Secondary | ICD-10-CM | POA: Diagnosis not present

## 2020-05-05 DIAGNOSIS — I5032 Chronic diastolic (congestive) heart failure: Secondary | ICD-10-CM | POA: Diagnosis not present

## 2020-05-05 DIAGNOSIS — I700 Atherosclerosis of aorta: Secondary | ICD-10-CM | POA: Diagnosis not present

## 2020-05-06 DIAGNOSIS — R110 Nausea: Secondary | ICD-10-CM | POA: Diagnosis not present

## 2020-05-06 DIAGNOSIS — R601 Generalized edema: Secondary | ICD-10-CM | POA: Diagnosis not present

## 2020-05-06 DIAGNOSIS — R300 Dysuria: Secondary | ICD-10-CM | POA: Diagnosis not present

## 2020-05-06 DIAGNOSIS — K529 Noninfective gastroenteritis and colitis, unspecified: Secondary | ICD-10-CM | POA: Diagnosis not present

## 2020-05-14 DIAGNOSIS — H44113 Panuveitis, bilateral: Secondary | ICD-10-CM | POA: Diagnosis not present

## 2020-05-19 ENCOUNTER — Ambulatory Visit: Payer: Medicare Other | Admitting: Podiatry

## 2020-06-05 ENCOUNTER — Ambulatory Visit (INDEPENDENT_AMBULATORY_CARE_PROVIDER_SITE_OTHER): Payer: Medicare Other | Admitting: Podiatry

## 2020-06-05 ENCOUNTER — Other Ambulatory Visit: Payer: Self-pay

## 2020-06-05 ENCOUNTER — Encounter: Payer: Self-pay | Admitting: Podiatry

## 2020-06-05 DIAGNOSIS — H44113 Panuveitis, bilateral: Secondary | ICD-10-CM | POA: Diagnosis not present

## 2020-06-05 DIAGNOSIS — E1151 Type 2 diabetes mellitus with diabetic peripheral angiopathy without gangrene: Secondary | ICD-10-CM

## 2020-06-05 DIAGNOSIS — B351 Tinea unguium: Secondary | ICD-10-CM | POA: Diagnosis not present

## 2020-06-05 DIAGNOSIS — E1169 Type 2 diabetes mellitus with other specified complication: Secondary | ICD-10-CM | POA: Diagnosis not present

## 2020-06-05 NOTE — Progress Notes (Signed)
  Subjective:  Patient ID: Heather Mccoy, female    DOB: 10-29-1945,  MRN: [id number]  Chief Complaint  Patient presents with  . Nail Problem    Trim nails   . Foot Problem    I have some pain in the arch area on the left foot     75 y.o. female presents with the above complaint. History confirmed with patient. Did not get MRI, did not answer when called to schedule.  Objective:  Physical Exam: warm, good capillary refill, no trophic changes or ulcerative lesions, normal DP and reduced PT pulses and normal sensory exam. Left Foot: POP left plantar foot with small lesion but no evident foreign body upon debridement. No longer palpable promience. Nails dystrophic and thickened.  Assessment:   1. Onychomycosis of multiple toenails with type 2 diabetes mellitus and peripheral angiopathy ([place])    Plan:  Patient was evaluated and treated and all questions answered.  Foreign body left foot -Pending MRI  Onyhcomycosis, DM, PAD -Nails debrided x10  Return if symptoms worsen or fail to improve.

## 2020-06-09 ENCOUNTER — Ambulatory Visit: Payer: Medicare Other | Admitting: Gastroenterology

## 2020-06-13 DIAGNOSIS — M19072 Primary osteoarthritis, left ankle and foot: Secondary | ICD-10-CM | POA: Diagnosis not present

## 2020-06-13 DIAGNOSIS — R600 Localized edema: Secondary | ICD-10-CM | POA: Diagnosis not present

## 2020-06-13 DIAGNOSIS — S90852D Superficial foreign body, left foot, subsequent encounter: Secondary | ICD-10-CM | POA: Diagnosis not present

## 2020-06-15 DIAGNOSIS — M545 Low back pain, unspecified: Secondary | ICD-10-CM | POA: Diagnosis not present

## 2020-06-15 DIAGNOSIS — R52 Pain, unspecified: Secondary | ICD-10-CM | POA: Diagnosis not present

## 2020-06-15 DIAGNOSIS — Z743 Need for continuous supervision: Secondary | ICD-10-CM | POA: Diagnosis not present

## 2020-06-15 DIAGNOSIS — G8929 Other chronic pain: Secondary | ICD-10-CM | POA: Diagnosis not present

## 2020-06-15 DIAGNOSIS — W19XXXA Unspecified fall, initial encounter: Secondary | ICD-10-CM | POA: Diagnosis not present

## 2020-06-15 DIAGNOSIS — M25562 Pain in left knee: Secondary | ICD-10-CM | POA: Diagnosis not present

## 2020-06-15 DIAGNOSIS — R0902 Hypoxemia: Secondary | ICD-10-CM | POA: Diagnosis not present

## 2020-06-18 ENCOUNTER — Telehealth: Payer: Self-pay | Admitting: Podiatry

## 2020-06-18 NOTE — Telephone Encounter (Signed)
Pt req MRI results from 06-13-20

## 2020-06-19 DIAGNOSIS — H44113 Panuveitis, bilateral: Secondary | ICD-10-CM | POA: Diagnosis not present

## 2020-06-26 DIAGNOSIS — M79605 Pain in left leg: Secondary | ICD-10-CM | POA: Diagnosis not present

## 2020-06-26 DIAGNOSIS — M7122 Synovial cyst of popliteal space [Baker], left knee: Secondary | ICD-10-CM | POA: Diagnosis not present

## 2020-06-30 ENCOUNTER — Other Ambulatory Visit: Payer: Self-pay

## 2020-07-01 ENCOUNTER — Ambulatory Visit: Payer: Medicare Other | Admitting: Cardiology

## 2020-07-01 ENCOUNTER — Other Ambulatory Visit: Payer: Self-pay

## 2020-07-01 ENCOUNTER — Encounter: Payer: Self-pay | Admitting: Cardiology

## 2020-07-01 ENCOUNTER — Ambulatory Visit (INDEPENDENT_AMBULATORY_CARE_PROVIDER_SITE_OTHER): Payer: Medicare Other | Admitting: Cardiology

## 2020-07-01 VITALS — BP 148/76 | HR 58 | Ht 62.0 in | Wt 135.0 lb

## 2020-07-01 DIAGNOSIS — I10 Essential (primary) hypertension: Secondary | ICD-10-CM | POA: Diagnosis not present

## 2020-07-01 DIAGNOSIS — E782 Mixed hyperlipidemia: Secondary | ICD-10-CM | POA: Diagnosis not present

## 2020-07-01 DIAGNOSIS — I25118 Atherosclerotic heart disease of native coronary artery with other forms of angina pectoris: Secondary | ICD-10-CM | POA: Diagnosis not present

## 2020-07-01 DIAGNOSIS — I471 Supraventricular tachycardia: Secondary | ICD-10-CM | POA: Diagnosis not present

## 2020-07-01 MED ORDER — MIDODRINE HCL 5 MG PO TABS: 5.0000 mg | ORAL_TABLET | Freq: Three times a day (TID) | ORAL | 5 refills | Status: DC

## 2020-07-01 NOTE — Telephone Encounter (Signed)
Attempted to call patient, no answer 

## 2020-07-01 NOTE — Patient Instructions (Signed)

## 2020-07-01 NOTE — Progress Notes (Signed)
Cardiology Office Note:    Date:  07/01/2020   ID:  Heather Mccoy, DOB [birth year], MRN [id number]  PCP:  Lind Guest, NP  Cardiologist:  Berniece Salines, DO  Electrophysiologist:  None   Referring MD: Lind Guest, NP   Chief Complaint  Patient presents with  . Follow-up   History of Present Illness:    Heather Mccoy is a 75 y.o. female with a hx of diabetes mellitus, colon cancer status post surgery, chronic diarrhea, anxiety, COPD not on oxygen, tobacco use, hypertension, CAD seen on Coronary CTAand hyperlipidemiapresents for follow-up visit.  I initially saw the patient onSeptember 4, 2020 at which time she was posthospitalization. She was hospitalized at [facility] in August where she was treated for syncope due to orthostatic hypotension. Her medications were optimized which included stopping her diuretics and she was started on midodrine and Florinef.On completion of our visit given her elevated blood pressure I did advise patient to only take her midodrine 5 mg 3 times daily if her systolic blood pressure was less than 120. Since her visit she has not needed to take the midodrine. She was also given a ZIO patch which she is still wearing to complete a 14-day monitoring. She was continued on the fludrocortisone 0.1 mg daily  The patient was seenon 02/05/2019.During that visit she was hypertensive and was still onfludrocortisone 0.1 mg daily. Started to titrate off the fludrocortisone. In addition the patient at the time did not want to start any antihypertensive medication. She was still wearing her Zio patch during the time of her visit.At her last visit October 14,2020 patient did have evidence of paroxysmal atrial tachycardiashe was started on carvedilol 6.25 mg twice daily.  He was seen on April 02, 2019 at which time she reported intermittent chest pain she described as left-sided burning sensation which radiated to her left arm and also  caused left arm numbness. During her visit we discussed getting a CTA coronaries.However at the conclusion of her visit the patient was being wanted checked out she did have an episode where she felt significantly lightheaded and was guided to the floor without falling or passing out.The patient was taken to [facility] at that time thought to be vasovagal syncope.   On April 16, 2019 I saw the patient in follow-up visit and she is still having some chest pain but tells me it did respond to her Imdur, and herCCTA was scheduled.  I did see the patient on 07/13/19 to discuss her CCTA results which showed evidence of coronay artery disease. She was started on renaxa 500mg  BID. She was also hypertensive, thereforeI increased her carvedilol to 12.5 mg twice daily.  In March 2021, the patient [facility] in the interimdue to left facial droop and weakness. She was diagnosed with bells palsy. She has been experiencing worsening diarrhea - she does have chronic diarrhea but she notes that this has gotten worse.  At her visit on July 31, 2019 at that time increase her Ranexa to 1000 mg twice a day. In the interim she was admitted to the [facility] for pneumonia and was placed on antibiotic for a while.  OnMay 4, 2021 at that time she was experiencing some dizziness which sounded like vertigo referred the patient to ENT. She also was hypotensive therefore I decreased hydralazine. At her October 17, 2019 visit which was her most recent visit her dizziness has improved significantly since her blood pressure medication was also changed.  She offers no complaints at that time she was continue on that regimen.  In June 2021 her dizziness had improved so I therefore continued her medication regimen.    On January 17, 2020 the patient presented with chest pain for which I placed the patient back on Ranexa.  At that time there was no dizziness.    In the interim  the patient presented for a vascular ultrasound and was noted to have be dizzy I manually did her blood pressure was positive orthostatic.  We therefore stopped her Lasix and started patient on midodrine 5 mg every 8 hours.  The patient was admitted at the [facility] on March 02, 2020 and discharged on October 21 during her hospitalization she presented due to unsteady gait with possible ataxia she was seen by neurology imaging for CT head and MRI were both unremarkable.  She did have positive orthostatic vitals.  She was seen by me colleagues in the [facility] at which time ranolazine, carvedilol, amlodipine and hydralazine were discontinued.  She had not started her midodrine therefore at that time midodrine 5 mg every 8 hours was started and she was also started on flecainide twice daily.  The patient was given compression stockings and while in the hospital her QTC were followed.  I saw the patient on March 31, 2020 at that time her dizziness had improved and she was on her midodrine.  No medication changes were made.  Since that time she tells me that she has been seen at the emergency department at [facility] because she presented due to back and leg pain.  She had testing done we did not show any evidence of venous thrombosis but 4.6 cm Baker's cyst was noted in her left lower extremity.  She is here today for follow-up visit.  She has been experiencing significant pain in her left knee and has a schedule appointment with orthopedics.  She stable for more cardiovascular standpoint she tells me.  She is here today with her friend.  Past Medical History:  Diagnosis Date  . Anxiety   . Aortic valve sclerosis   . Arthritis   . Bell's palsy 07/31/2019  . Cancer ([place])    colon  . Chronic diarrhea of unknown origin 01/12/2012  . COPD (chronic obstructive pulmonary disease) ([place])   . Coronary artery disease of native artery of native heart with stable angina pectoris  ([place]) 06/11/2019  . Diabetes mellitus   . Dizziness 09/18/2019  . Dysrhythmia   . Epigastric pain   . Essential hypertension 06/11/2019  . Hyperlipemia 01/19/2019  . Hyperlipidemia   . Hypertension   . Mixed hyperlipidemia 06/11/2019  . Mood disorder ([place])   . Nausea and vomiting   . Normocytic anemia 09/12/2014  . Orthostatic dizziness 03/03/2020  . Orthostatic hypotension   . Paroxysmal atrial tachycardia ([place]) 06/11/2019  . Polypharmacy   . Shortness of breath   . Type II diabetes mellitus ([place]) 01/19/2019  . Unsteady gait   . Vitamin B12 deficiency 09/13/2014  . Weight loss 09/12/2014    Past Surgical History:  Procedure Laterality Date  . APPENDECTOMY    . BACK SURGERY    . CHOLECYSTECTOMY    . COLON SURGERY    . EUS  01/12/2012   Procedure: UPPER ENDOSCOPIC ULTRASOUND (EUS) RADIAL;  Surgeon: Arta Silence, MD;  Location: [facility] ENDOSCOPY;  Service: Endoscopy;  Laterality: N/A;  to be admitted after  . HAND SURGERY    . REPLACEMENT TOTAL  KNEE      Current Medications: Current Meds  Medication Sig  . alendronate (FOSAMAX) 70 MG tablet Take 70 mg by mouth every Sunday. Take with a full glass of water on an empty stomach.  Marland Kitchen aspirin EC 81 MG tablet Take 81 mg by mouth daily.  Marland Kitchen atorvastatin (LIPITOR) 40 MG tablet Take 40 mg by mouth daily.  . carvedilol (COREG) 12.5 MG tablet Take 12.5 mg by mouth 2 (two) times daily with a meal.  . citalopram (CELEXA) 10 MG tablet Take 10 mg by mouth daily.  . diazepam (VALIUM) 5 MG tablet Take 5 mg by mouth 2 (two) times daily.  Marland Kitchen dicyclomine (BENTYL) 20 MG tablet Take 20 mg by mouth 3 (three) times daily.  . diphenoxylate-atropine (LOMOTIL) 2.5-0.025 MG tablet Take 2 tablets by mouth 4 (four) times daily.  . famotidine (PEPCID) 40 MG tablet Take 40 mg by mouth 2 (two) times daily.  . FEROSUL 325 (65 Fe) MG tablet Take 325 mg by mouth daily.  . flecainide (TAMBOCOR) 50 MG tablet Take 1 tablet (50 mg total) by mouth every 12 (twelve) hours.  .  furosemide (LASIX) 20 MG tablet Take 20 mg by mouth daily as needed for edema.  . hydrALAZINE (APRESOLINE) 25 MG tablet Take 25 mg by mouth 2 (two) times daily.  Marland Kitchen lidocaine (LIDODERM) 5 % Place 1 patch onto the skin daily. Remove & Discard patch within 12 hours or as directed by MD  . loperamide (IMODIUM) 2 MG capsule Take 2 mg by mouth every 6 (six) hours as needed for diarrhea or loose stools.  . meloxicam (MOBIC) 7.5 MG tablet Take 7.5 mg by mouth 2 (two) times daily.  . methocarbamol (ROBAXIN) 750 MG tablet Take 750 mg by mouth 2 (two) times daily.  . metoprolol succinate (TOPROL-XL) 25 MG 24 hr tablet Take 0.5 tablets (12.5 mg total) by mouth daily.  . midodrine (PROAMATINE) 5 MG tablet Take 1 tablet (5 mg total) by mouth 3 (three) times daily with meals.  . montelukast (SINGULAIR) 10 MG tablet TAKE 1 TABLET BY MOUTH AT BEDTIME  . ondansetron (ZOFRAN) 4 MG tablet Take 4 mg by mouth 2 (two) times daily as needed for nausea.  . pantoprazole (PROTONIX) 40 MG tablet Take 40 mg by mouth daily.  . potassium chloride SA (KLOR-CON) 20 MEQ tablet Take 20 mEq by mouth daily.  . ranolazine (RANEXA) 500 MG 12 hr tablet Take 1,000 mg by mouth 2 (two) times daily.     Allergies:   Codeine, Morphine, Nitroglycerin, Tylenol [acetaminophen], and Oxycodone   Social History   Socioeconomic History  . Marital status: Widowed    Spouse name: Not on file  . Number of children: Not on file  . Years of education: Not on file  . Highest education level: Not on file  Occupational History  . Not on file  Tobacco Use  . Smoking status: Former Smoker    Packs/day: 0.50    Types: Cigarettes    Quit date: 04/30/2020    Years since quitting: 0.1  . Smokeless tobacco: Never Used  Vaping Use  . Vaping Use: Never used  Substance and Sexual Activity  . Alcohol use: No  . Drug use: No  . Sexual activity: Never  Other Topics Concern  . Not on file  Social History Narrative  . Not on file   Social  Determinants of Health   Financial Resource Strain: Not on file  Food Insecurity: Not on file  Transportation Needs: Not on file  Physical Activity: Not on file  Stress: Not on file  Social Connections: Not on file     Family History: The patient's family history includes CVA in her father; Cerebral aneurysm in her mother; Heart attack in her father and sister.  ROS:   Review of Systems  Constitution: Negative for decreased appetite, fever and weight gain.  HENT: Negative for congestion, ear discharge, hoarse voice and sore throat.   Eyes: Negative for discharge, redness, vision loss in right eye and visual halos.  Cardiovascular: Negative for chest pain, dyspnea on exertion, leg swelling, orthopnea and palpitations.  Respiratory: Negative for cough, hemoptysis, shortness of breath and snoring.   Endocrine: Negative for heat intolerance and polyphagia.  Hematologic/Lymphatic: Negative for bleeding problem. Does not bruise/bleed easily.  Skin: Negative for flushing, nail changes, rash and suspicious lesions.  Musculoskeletal: Negative for arthritis, joint pain, muscle cramps, myalgias, neck pain and stiffness.  Gastrointestinal: Negative for abdominal pain, bowel incontinence, diarrhea and excessive appetite.  Genitourinary: Negative for decreased libido, genital sores and incomplete emptying.  Neurological: Negative for brief paralysis, focal weakness, headaches and loss of balance.  Psychiatric/Behavioral: Negative for altered mental status, depression and suicidal ideas.  Allergic/Immunologic: Negative for HIV exposure and persistent infections.    EKGs/Labs/Other Studies Reviewed:    The following studies were reviewed today:   EKG: None today.  Recent Labs: 03/02/2020: B Natriuretic Peptide 109.0 03/05/2020: ALT 17 03/06/2020: Hemoglobin 11.3; Platelets 162 03/31/2020: BUN 15; Creatinine, Ser 0.77; Magnesium 1.8; Potassium 4.7; Sodium 142  Recent Lipid Panel     Component Value Date/Time   CHOL 70 03/03/2020 0436   TRIG 68 03/03/2020 0436   HDL 36 (L) 03/03/2020 0436   CHOLHDL 1.9 03/03/2020 0436   VLDL 14 03/03/2020 0436   LDLCALC 20 03/03/2020 0436    Physical Exam:    VS:  BP (!) 148/76 (BP Location: Right Arm, Patient Position: Sitting, Cuff Size: Normal)   Pulse (!) 58   Ht 5\' 2"  (1.575 m)   Wt 135 lb (61.2 kg)   SpO2 97%   BMI 24.69 kg/m     Wt Readings from Last 3 Encounters:  07/01/20 135 lb (61.2 kg)  03/31/20 138 lb 6.4 oz (62.8 kg)  03/02/20 135 lb 12.9 oz (61.6 kg)     GEN: Well nourished, well developed in no acute distress HEENT: Normal NECK: No JVD; No carotid bruits LYMPHATICS: No lymphadenopathy CARDIAC: S1S2 noted,RRR, no murmurs, rubs, gallops RESPIRATORY:  Clear to auscultation without rales, wheezing or rhonchi  ABDOMEN: Soft, non-tender, non-distended, +bowel sounds, no guarding. EXTREMITIES: No edema, No cyanosis, no clubbing MUSCULOSKELETAL:  No deformity  SKIN: Warm and dry NEUROLOGIC:  Alert and oriented x 3, non-focal PSYCHIATRIC:  Normal affect, good insight  ASSESSMENT:    1. Essential hypertension   2. Paroxysmal atrial tachycardia ([place])   3. Mixed hyperlipidemia   4. Coronary artery disease of native artery of native heart with stable angina pectoris ([place])    PLAN:     From a cardiovascular standpoint she is doing well.  We will keep her on her regimen that she is currently on no changes will be made.  No angina symptoms at this time continue patient her current regimen for this plan Her blood pressure is acceptable she also is taking midodrine.  She has had no dizziness or syncope episode. Continue patient on current statin medication.  She is pending a consultation with orthopedic surgery for her Baker's  cyst.  The patient is in agreement with the above plan. The patient left the office in stable condition.  The patient will follow up in 3 months or sooner if needed.   Medication  Adjustments/Labs and Tests Ordered: Current medicines are reviewed at length with the patient today.  Concerns regarding medicines are outlined above.  No orders of the defined types were placed in this encounter.  No orders of the defined types were placed in this encounter.   Patient Instructions  Medication Instructions:  Your physician recommends that you continue on your current medications as directed. Please refer to the Current Medication list given to you today.  *If you need a refill on your cardiac medications before your next appointment, please call your pharmacy*   Lab Work: None If you have labs (blood work) drawn today and your tests are completely normal, you will receive your results only by: Marland Kitchen MyChart Message (if you have MyChart) OR . A paper copy in the mail If you have any lab test that is abnormal or we need to change your treatment, we will call you to review the results.   Testing/Procedures: None   Follow-Up: At [facility], you and your health needs are our priority.  As part of our continuing mission to provide you with exceptional heart care, we have created designated Provider Care Teams.  These Care Teams include your primary Cardiologist (physician) and Advanced Practice Providers (APPs -  Physician Assistants and Nurse Practitioners) who all work together to provide you with the care you need, when you need it.  We recommend signing up for the patient portal called "MyChart".  Sign up information is provided on this After Visit Summary.  MyChart is used to connect with patients for Virtual Visits (Telemedicine).  Patients are able to view lab/test results, encounter notes, upcoming appointments, etc.  Non-urgent messages can be sent to your provider as well.   To learn more about what you can do with MyChart, go to [email or web address].    Your next appointment:   3 month(s)  The format for your next appointment:   In Person  Provider:    Berniece Salines, DO   Other Instructions      Adopting a Healthy Lifestyle.  Know what a healthy weight is for you (roughly BMI <25) and aim to maintain this   Aim for 7+ servings of fruits and vegetables daily   65-80+ fluid ounces of water or unsweet tea for healthy kidneys   Limit to max 1 drink of alcohol per day; avoid smoking/tobacco   Limit animal fats in diet for cholesterol and heart health - choose grass fed whenever available   Avoid highly processed foods, and foods high in saturated/trans fats   Aim for low stress - take time to unwind and care for your mental health   Aim for 150 min of moderate intensity exercise weekly for heart health, and weights twice weekly for bone health   Aim for 7-9 hours of sleep daily   When it comes to diets, agreement about the perfect plan isnt easy to find, even among the experts. Experts at the [employer] developed an idea known as the Healthy Eating Plate. Just imagine a plate divided into logical, healthy portions.   The emphasis is on diet quality:   Load up on vegetables and fruits - one-half of your plate: Aim for color and variety, and remember that potatoes dont count.   Go for whole  grains - one-quarter of your plate: Whole wheat, barley, wheat berries, quinoa, oats, brown rice, and foods made with them. If you want pasta, go with whole wheat pasta.   Protein power - one-quarter of your plate: Fish, chicken, beans, and nuts are all healthy, versatile protein sources. Limit red meat.   The diet, however, does go beyond the plate, offering a few other suggestions.   Use healthy plant oils, such as olive, canola, soy, corn, sunflower and peanut. Check the labels, and avoid partially hydrogenated oil, which have unhealthy trans fats.   If youre thirsty, drink water. Coffee and tea are good in moderation, but skip sugary drinks and limit milk and dairy products to one or two daily servings.   The  type of carbohydrate in the diet is more important than the amount. Some sources of carbohydrates, such as vegetables, fruits, whole grains, and beans-are healthier than others.   Finally, stay active  Signed, Berniece Salines, DO  07/01/2020 10:43 AM    [place]

## 2020-07-02 NOTE — Telephone Encounter (Signed)
07-02-20 Lmtcb to sched/reb

## 2020-07-04 ENCOUNTER — Encounter: Payer: Self-pay | Admitting: Podiatry

## 2020-07-07 DIAGNOSIS — M1712 Unilateral primary osteoarthritis, left knee: Secondary | ICD-10-CM | POA: Diagnosis not present

## 2020-07-07 DIAGNOSIS — M25462 Effusion, left knee: Secondary | ICD-10-CM | POA: Diagnosis not present

## 2020-07-17 ENCOUNTER — Telehealth: Payer: Self-pay | Admitting: Cardiology

## 2020-07-17 DIAGNOSIS — R3915 Urgency of urination: Secondary | ICD-10-CM | POA: Diagnosis not present

## 2020-07-17 DIAGNOSIS — R112 Nausea with vomiting, unspecified: Secondary | ICD-10-CM | POA: Diagnosis not present

## 2020-07-17 DIAGNOSIS — I110 Hypertensive heart disease with heart failure: Secondary | ICD-10-CM | POA: Diagnosis not present

## 2020-07-17 NOTE — Telephone Encounter (Signed)
Attempted to call patient. No answer, mailbox is full, could not leave a message.

## 2020-07-17 NOTE — Telephone Encounter (Signed)
RN returned patients phone call. NO answer, left voicemail to call back.

## 2020-07-17 NOTE — Telephone Encounter (Signed)
Patient wants to talk with Dr. Harriet Masson or nurse regarding medication. Please call back

## 2020-07-18 ENCOUNTER — Telehealth: Payer: Self-pay

## 2020-07-18 ENCOUNTER — Other Ambulatory Visit: Payer: Self-pay

## 2020-07-18 MED ORDER — MIDODRINE HCL 5 MG PO TABS: 5.0000 mg | ORAL_TABLET | Freq: Three times a day (TID) | ORAL | 5 refills | Status: DC

## 2020-07-18 NOTE — Telephone Encounter (Signed)
Spoke with patient, spoke with patient she states she was unable to pick up Midodrine the day the refill was sent in. The refill was resent today per patient's request. I called pharmacy to verify they received the order.

## 2020-07-18 NOTE — Telephone Encounter (Signed)
Patient state she was unable to pick up refill of Midodrine the day it was sent. Refill resent for patient per her request.

## 2020-07-21 ENCOUNTER — Ambulatory Visit (INDEPENDENT_AMBULATORY_CARE_PROVIDER_SITE_OTHER): Payer: Medicare Other | Admitting: Podiatry

## 2020-07-21 ENCOUNTER — Other Ambulatory Visit: Payer: Self-pay

## 2020-07-21 DIAGNOSIS — E1151 Type 2 diabetes mellitus with diabetic peripheral angiopathy without gangrene: Secondary | ICD-10-CM

## 2020-07-21 DIAGNOSIS — Q828 Other specified congenital malformations of skin: Secondary | ICD-10-CM | POA: Diagnosis not present

## 2020-07-21 DIAGNOSIS — L600 Ingrowing nail: Secondary | ICD-10-CM | POA: Diagnosis not present

## 2020-07-21 DIAGNOSIS — B351 Tinea unguium: Secondary | ICD-10-CM | POA: Diagnosis not present

## 2020-07-21 DIAGNOSIS — E1169 Type 2 diabetes mellitus with other specified complication: Secondary | ICD-10-CM

## 2020-07-21 NOTE — Progress Notes (Signed)
  Subjective:  Patient ID: Heather Mccoy, female    DOB: 1946/03/11,  MRN: [id number]  Chief Complaint  Patient presents with  . Results    Review MRI results -per pt foot is stil hurting very bad; 24/10    75 y.o. female presents with the above complaint. History confirmed with patient. Had MRI and here for review.  Objective:  Physical Exam: warm, good capillary refill, no trophic changes or ulcerative lesions, normal DP and reduced PT pulses and normal sensory exam. Left Foot: POP left plantar foot with small lesion but no evident foreign body upon debridement. Bilateral hallux nail thickened and ingrown Assessment:   1. Onychomycosis of multiple toenails with type 2 diabetes mellitus and peripheral angiopathy ([place])   2. Porokeratosis   3. Ingrown nail    Plan:  Patient was evaluated and treated and all questions answered.  ?Foreign body left foot; Likely punctate keratosis and fat pad atrophy -MRI reviewed, no FB -Lesions pared and destroyed with salinocaine  Onyhcomycosis, DM, PAD -Nails debrided x10  No follow-ups on file.

## 2020-07-28 DIAGNOSIS — M1712 Unilateral primary osteoarthritis, left knee: Secondary | ICD-10-CM | POA: Diagnosis not present

## 2020-07-31 DIAGNOSIS — I700 Atherosclerosis of aorta: Secondary | ICD-10-CM | POA: Diagnosis not present

## 2020-07-31 DIAGNOSIS — Z1231 Encounter for screening mammogram for malignant neoplasm of breast: Secondary | ICD-10-CM | POA: Diagnosis not present

## 2020-07-31 DIAGNOSIS — I110 Hypertensive heart disease with heart failure: Secondary | ICD-10-CM | POA: Diagnosis not present

## 2020-07-31 DIAGNOSIS — Z7189 Other specified counseling: Secondary | ICD-10-CM | POA: Diagnosis not present

## 2020-07-31 DIAGNOSIS — J449 Chronic obstructive pulmonary disease, unspecified: Secondary | ICD-10-CM | POA: Diagnosis not present

## 2020-07-31 DIAGNOSIS — E1169 Type 2 diabetes mellitus with other specified complication: Secondary | ICD-10-CM | POA: Diagnosis not present

## 2020-07-31 DIAGNOSIS — R350 Frequency of micturition: Secondary | ICD-10-CM | POA: Diagnosis not present

## 2020-07-31 DIAGNOSIS — I5032 Chronic diastolic (congestive) heart failure: Secondary | ICD-10-CM | POA: Diagnosis not present

## 2020-07-31 DIAGNOSIS — Z0001 Encounter for general adult medical examination with abnormal findings: Secondary | ICD-10-CM | POA: Diagnosis not present

## 2020-07-31 DIAGNOSIS — Z1159 Encounter for screening for other viral diseases: Secondary | ICD-10-CM | POA: Diagnosis not present

## 2020-08-01 DIAGNOSIS — M25562 Pain in left knee: Secondary | ICD-10-CM | POA: Diagnosis not present

## 2020-08-04 DIAGNOSIS — Z79899 Other long term (current) drug therapy: Secondary | ICD-10-CM | POA: Diagnosis not present

## 2020-08-04 DIAGNOSIS — Z1159 Encounter for screening for other viral diseases: Secondary | ICD-10-CM | POA: Diagnosis not present

## 2020-08-04 DIAGNOSIS — Z0001 Encounter for general adult medical examination with abnormal findings: Secondary | ICD-10-CM | POA: Diagnosis not present

## 2020-08-04 DIAGNOSIS — E1169 Type 2 diabetes mellitus with other specified complication: Secondary | ICD-10-CM | POA: Diagnosis not present

## 2020-08-04 DIAGNOSIS — M23322 Other meniscus derangements, posterior horn of medial meniscus, left knee: Secondary | ICD-10-CM | POA: Diagnosis not present

## 2020-08-04 DIAGNOSIS — M1712 Unilateral primary osteoarthritis, left knee: Secondary | ICD-10-CM | POA: Diagnosis not present

## 2020-08-14 DIAGNOSIS — I110 Hypertensive heart disease with heart failure: Secondary | ICD-10-CM | POA: Diagnosis not present

## 2020-08-14 DIAGNOSIS — I5032 Chronic diastolic (congestive) heart failure: Secondary | ICD-10-CM | POA: Diagnosis not present

## 2020-08-14 DIAGNOSIS — E1169 Type 2 diabetes mellitus with other specified complication: Secondary | ICD-10-CM | POA: Diagnosis not present

## 2020-08-18 ENCOUNTER — Other Ambulatory Visit: Payer: Self-pay

## 2020-08-18 ENCOUNTER — Ambulatory Visit (INDEPENDENT_AMBULATORY_CARE_PROVIDER_SITE_OTHER): Payer: Medicare HMO | Admitting: Podiatry

## 2020-08-18 ENCOUNTER — Encounter: Payer: Self-pay | Admitting: Podiatry

## 2020-08-18 ENCOUNTER — Other Ambulatory Visit: Payer: Self-pay | Admitting: *Deleted

## 2020-08-18 DIAGNOSIS — Q828 Other specified congenital malformations of skin: Secondary | ICD-10-CM

## 2020-08-18 DIAGNOSIS — L600 Ingrowing nail: Secondary | ICD-10-CM | POA: Diagnosis not present

## 2020-08-18 NOTE — Progress Notes (Signed)
  Subjective:  Patient ID: MARCEIL WELP, female    DOB: December 09, 1945,  MRN: [id number]  Chief Complaint  Patient presents with  . Nail Problem    I have some toenails that are hurting and I wish I could get the toenails taken off    75 y.o. female presents with the above complaint. History confirmed with patient.    Objective:  Physical Exam: warm, good capillary refill, no trophic changes or ulcerative lesions, normal DP and reduced PT pulses and normal sensory exam. Left Foot: POP left plantar foot porokeratosis. Bilateral hallux nail thickened and ingrown Assessment:   1. Porokeratosis   2. Ingrown nail    Plan:  Patient was evaluated and treated and all questions answered.  Punctate keratosis and fat pad atrophy -Offload with met pad  Onyhcomycosis, DM, PAD -Nails x2 debrided in slant back fashion  No follow-ups on file.

## 2020-09-13 DIAGNOSIS — I110 Hypertensive heart disease with heart failure: Secondary | ICD-10-CM | POA: Diagnosis not present

## 2020-09-13 DIAGNOSIS — E1169 Type 2 diabetes mellitus with other specified complication: Secondary | ICD-10-CM | POA: Diagnosis not present

## 2020-09-13 DIAGNOSIS — I5032 Chronic diastolic (congestive) heart failure: Secondary | ICD-10-CM | POA: Diagnosis not present

## 2020-09-24 DIAGNOSIS — H44113 Panuveitis, bilateral: Secondary | ICD-10-CM | POA: Diagnosis not present

## 2020-09-24 DIAGNOSIS — H547 Unspecified visual loss: Secondary | ICD-10-CM | POA: Diagnosis not present

## 2020-09-30 ENCOUNTER — Ambulatory Visit (INDEPENDENT_AMBULATORY_CARE_PROVIDER_SITE_OTHER): Payer: Medicare HMO | Admitting: Cardiology

## 2020-09-30 ENCOUNTER — Other Ambulatory Visit: Payer: Self-pay

## 2020-09-30 ENCOUNTER — Encounter: Payer: Self-pay | Admitting: Cardiology

## 2020-09-30 VITALS — BP 136/70 | HR 68 | Ht 62.0 in | Wt 142.0 lb

## 2020-09-30 DIAGNOSIS — E782 Mixed hyperlipidemia: Secondary | ICD-10-CM

## 2020-09-30 DIAGNOSIS — I25118 Atherosclerotic heart disease of native coronary artery with other forms of angina pectoris: Secondary | ICD-10-CM | POA: Diagnosis not present

## 2020-09-30 DIAGNOSIS — E1100 Type 2 diabetes mellitus with hyperosmolarity without nonketotic hyperglycemic-hyperosmolar coma (NKHHC): Secondary | ICD-10-CM | POA: Diagnosis not present

## 2020-09-30 DIAGNOSIS — I10 Essential (primary) hypertension: Secondary | ICD-10-CM

## 2020-09-30 DIAGNOSIS — I471 Supraventricular tachycardia: Secondary | ICD-10-CM | POA: Diagnosis not present

## 2020-09-30 NOTE — Progress Notes (Signed)
Cardiology Office Note:    Date:  09/30/2020   ID:  Heather Mccoy, DOB [birth year], MRN [id number]  PCP:  Haydee Salter, NP  Cardiologist:  Berniece Salines, DO  Electrophysiologist:  None   Referring MD: Haydee Salter, NP   My heart is ok but I was told I may have cancer in my eye  History of Present Illness:    Heather Mccoy is a 75 y.o. female with a hx of diabetes mellitus, colon cancer status post surgery, chronic diarrhea, anxiety, COPD not on oxygen, tobacco use, hypertension, CAD seen on Coronary CTAand hyperlipidemiapresents for follow-up visit.  I initially saw the patient onSeptember 4, 2020 at which time she was posthospitalization. She was hospitalized at [facility] in August where she was treated for syncope due to orthostatic hypotension. Her medications were optimized which included stopping her diuretics and she was started on midodrine and Florinef.On completion of our visit given her elevated blood pressure I did advise patient to only take her midodrine 5 mg 3 times daily if her systolic blood pressure was less than 120. Since her visit she has not needed to take the midodrine. She was also given a ZIO patch which she is still wearing to complete a 14-day monitoring. She was continued on the fludrocortisone 0.1 mg daily  The patient was seenon 02/05/2019.During that visit she was hypertensive and was still onfludrocortisone 0.1 mg daily. Started to titrate off the fludrocortisone. In addition the patient at the time did not want to start any antihypertensive medication. She was still wearing her Zio patch during the time of her visit.At her last visit October 14,2020 patient did have evidence of paroxysmal atrial tachycardiashe was started on carvedilol 6.25 mg twice daily.  He was seen on April 02, 2019 at which time she reported intermittent chest pain she described as left-sided burning sensation which radiated to her left arm and also  caused left arm numbness. During her visit we discussed getting a CTA coronaries.However at the conclusion of her visit the patient was being wanted checked out she did have an episode where she felt significantly lightheaded and was guided to the floor without falling or passing out.The patient was taken to [facility] at that time thought to be vasovagal syncope.   On April 16, 2019 I saw the patient in follow-up visit and she is still having some chest pain but tells me it did respond to her Imdur, and herCCTA was scheduled.  I did see the patient on 07/13/19 to discuss her CCTA results which showed evidence of coronay artery disease. She was started on renaxa 500mg  BID. She was also hypertensive, thereforeI increased her carvedilol to 12.5 mg twice daily.  In March 2021, the [facility] interimdue to left facial droop and weakness. She was diagnosed with bells palsy. She has been experiencing worsening diarrhea - she does have chronic diarrhea but she notes that this has gotten worse.  At her visit on July 31, 2019 at that time increase her Ranexa to 1000 mg twice a day. In the interim she was admitted to the [facility] for pneumonia and was placed on antibiotic for a while.  OnMay 4, 2021 at that time she was experiencing some dizziness which sounded like vertigo referred the patient to ENT. She also was hypotensive therefore I decreased hydralazine. At her October 17, 2019 visit which was her most recent visit her dizziness has improved significantly since her blood pressure medication  was also changed. She offers no complaints at that time she was continue on that regimen.  In June 2021 her dizziness had improved so I therefore continued her medication regimen.   On January 17, 2020 the patient presented with chest pain for which I placed the patient back on Ranexa. At that time there was no dizziness.   In the interim  the patient presented for a vascular ultrasound and was noted to have be dizzy I manually did her blood pressure was positive orthostatic. We therefore stopped her Lasix and started patient on midodrine 5 mg every 8 hours.  The patient was admitted at the [facility] on March 02, 2020 and discharged on October 21 during her hospitalization she presented due to unsteady gait with possible ataxia she was seen by neurology imaging for CT head and MRI were both unremarkable. She did have positive orthostatic vitals. She was seen by me colleagues in the [facility] at which time ranolazine, carvedilol, amlodipine and hydralazine were discontinued. She had not started her midodrine therefore at that time midodrine 5 mg every 8 hours was started and she was also started on flecainide twice daily. The patient was given compression stockings and while in the hospital her QTC were followed.  I saw the patient on March 31, 2020 at that time her dizziness had improved and she was on her midodrine.  No medication changes were made.  I saw the patient on 07/01/2020 at that time she appeared to be doing well from a CV standpoint.  Today she tells me that she was told that she may have cancer in her eyes and is pending an evaluation at [facility].   Past Medical History:  Diagnosis Date  . Anxiety   . Aortic valve sclerosis   . Arthritis   . Bell's palsy 07/31/2019  . Cancer ([place])    colon  . Chronic diarrhea of unknown origin 01/12/2012  . COPD (chronic obstructive pulmonary disease) ([place])   . Coronary artery disease of native artery of native heart with stable angina pectoris ([place]) 06/11/2019  . Diabetes mellitus   . Dizziness 09/18/2019  . Dysrhythmia   . Epigastric pain   . Essential hypertension 06/11/2019  . Hyperlipemia 01/19/2019  . Hyperlipidemia   . Hypertension   . Mixed hyperlipidemia 06/11/2019  . Mood disorder ([place])   . Nausea and vomiting   . Normocytic anemia 09/12/2014  .  Orthostatic dizziness 03/03/2020  . Orthostatic hypotension   . Paroxysmal atrial tachycardia ([place]) 06/11/2019  . Polypharmacy   . Shortness of breath   . Type II diabetes mellitus ([place]) 01/19/2019  . Unsteady gait   . Vitamin B12 deficiency 09/13/2014  . Weight loss 09/12/2014    Past Surgical History:  Procedure Laterality Date  . APPENDECTOMY    . BACK SURGERY    . CHOLECYSTECTOMY    . COLON SURGERY    . EUS  01/12/2012   Procedure: UPPER ENDOSCOPIC ULTRASOUND (EUS) RADIAL;  Surgeon: Arta Silence, MD;  Location: [facility];  Service: Endoscopy;  Laterality: N/A;  to be admitted after  . HAND SURGERY    . REPLACEMENT TOTAL KNEE      Current Medications: Current Meds  Medication Sig  . alendronate (FOSAMAX) 70 MG tablet Take 70 mg by mouth every Sunday. Take with a full glass of water on an empty stomach.  Marland Kitchen aspirin EC 81 MG tablet Take 81 mg by mouth daily.  Marland Kitchen atorvastatin (LIPITOR) 40 MG tablet  Take 40 mg by mouth daily.  . carvedilol (COREG) 12.5 MG tablet Take 12.5 mg by mouth 2 (two) times daily with a meal.  . citalopram (CELEXA) 10 MG tablet Take 10 mg by mouth daily.  . diazepam (VALIUM) 5 MG tablet Take 5 mg by mouth 2 (two) times daily.  Marland Kitchen dicyclomine (BENTYL) 20 MG tablet Take 20 mg by mouth 3 (three) times daily.  . diphenoxylate-atropine (LOMOTIL) 2.5-0.025 MG tablet Take 2 tablets by mouth 4 (four) times daily.  . famotidine (PEPCID) 40 MG tablet Take 40 mg by mouth 2 (two) times daily.  . FEROSUL 325 (65 Fe) MG tablet Take 325 mg by mouth daily.  . flecainide (TAMBOCOR) 50 MG tablet Take 1 tablet (50 mg total) by mouth every 12 (twelve) hours.  . furosemide (LASIX) 20 MG tablet Take 20 mg by mouth daily as needed for edema.  . hydrALAZINE (APRESOLINE) 25 MG tablet Take 25 mg by mouth 2 (two) times daily.  Marland Kitchen lidocaine (LIDODERM) 5 % Place 1 patch onto the skin daily. Remove & Discard patch within 12 hours or as directed by MD  . loperamide (IMODIUM) 2 MG capsule  Take 2 mg by mouth every 6 (six) hours as needed for diarrhea or loose stools.  . meloxicam (MOBIC) 7.5 MG tablet Take 7.5 mg by mouth 2 (two) times daily.  . methocarbamol (ROBAXIN) 750 MG tablet Take 750 mg by mouth 2 (two) times daily.  . midodrine (PROAMATINE) 5 MG tablet Take 5 mg by mouth 3 (three) times daily with meals.  . montelukast (SINGULAIR) 10 MG tablet TAKE 1 TABLET BY MOUTH AT BEDTIME  . ondansetron (ZOFRAN) 4 MG tablet Take 4 mg by mouth 2 (two) times daily as needed for nausea.  . pantoprazole (PROTONIX) 40 MG tablet Take 40 mg by mouth daily.  . potassium chloride SA (KLOR-CON) 20 MEQ tablet Take 20 mEq by mouth daily.  . ranolazine (RANEXA) 500 MG 12 hr tablet Take 1,000 mg by mouth 2 (two) times daily.  . [DISCONTINUED] metoprolol succinate (TOPROL-XL) 25 MG 24 hr tablet Take 0.5 tablets (12.5 mg total) by mouth daily.     Allergies:   Codeine, Morphine, Nitroglycerin, Tylenol [acetaminophen], and Oxycodone   Social History   Socioeconomic History  . Marital status: Widowed    Spouse name: Not on file  . Number of children: Not on file  . Years of education: Not on file  . Highest education level: Not on file  Occupational History  . Not on file  Tobacco Use  . Smoking status: Former Smoker    Packs/day: 0.50    Types: Cigarettes    Quit date: 04/30/2020    Years since quitting: 0.4  . Smokeless tobacco: Never Used  Vaping Use  . Vaping Use: Never used  Substance and Sexual Activity  . Alcohol use: No  . Drug use: No  . Sexual activity: Never  Other Topics Concern  . Not on file  Social History Narrative  . Not on file   Social Determinants of Health   Financial Resource Strain: Not on file  Food Insecurity: Not on file  Transportation Needs: Not on file  Physical Activity: Not on file  Stress: Not on file  Social Connections: Not on file     Family History: The patient's family history includes CVA in her father; Cerebral aneurysm in her  mother; Heart attack in her father and sister.  ROS:   Review of Systems  Constitution: Negative  for decreased appetite, fever and weight gain.  HENT: Negative for congestion, ear discharge, hoarse voice and sore throat.   Eyes: Negative for discharge, redness, vision loss in right eye and visual halos.  Cardiovascular: Negative for chest pain, dyspnea on exertion, leg swelling, orthopnea and palpitations.  Respiratory: Negative for cough, hemoptysis, shortness of breath and snoring.   Endocrine: Negative for heat intolerance and polyphagia.  Hematologic/Lymphatic: Negative for bleeding problem. Does not bruise/bleed easily.  Skin: Negative for flushing, nail changes, rash and suspicious lesions.  Musculoskeletal: Negative for arthritis, joint pain, muscle cramps, myalgias, neck pain and stiffness.  Gastrointestinal: Negative for abdominal pain, bowel incontinence, diarrhea and excessive appetite.  Genitourinary: Negative for decreased libido, genital sores and incomplete emptying.  Neurological: Negative for brief paralysis, focal weakness, headaches and loss of balance.  Psychiatric/Behavioral: Negative for altered mental status, depression and suicidal ideas.  Allergic/Immunologic: Negative for HIV exposure and persistent infections.    EKGs/Labs/Other Studies Reviewed:    The following studies were reviewed today:   EKG:  None today  TTE 02/2020 IMPRESSIONS    1. Left ventricular ejection fraction, by estimation, is 60 to 65%. The  left ventricle has normal function. The left ventricle has no regional  wall motion abnormalities. There is mild left ventricular hypertrophy.  Left ventricular diastolic parameters  are consistent with Grade I diastolic dysfunction (impaired relaxation).  2. Right ventricular systolic function is normal. The right ventricular  size is normal.  3. Left atrial size was mildly dilated.  4. The mitral valve is normal in structure. Trivial mitral  valve  regurgitation. No evidence of mitral stenosis.  5. The aortic valve is tricuspid. Aortic valve regurgitation is not  visualized. Mild aortic valve sclerosis is present, with no evidence of  aortic valve stenosis.  6. The inferior vena cava is normal in size with greater than 50%  respiratory variability, suggesting right atrial pressure of 3 mmHg.   FINDINGS  Left Ventricle: Left ventricular ejection fraction, by estimation, is 60  to 65%. The left ventricle has normal function. The left ventricle has no  regional wall motion abnormalities. The left ventricular internal cavity  size was normal in size. There is  mild left ventricular hypertrophy. Left ventricular diastolic parameters  are consistent with Grade I diastolic dysfunction (impaired relaxation).   Right Ventricle: The right ventricular size is normal.Right ventricular  systolic function is normal.   Left Atrium: Left atrial size was mildly dilated.   Right Atrium: Right atrial size was normal in size.   Pericardium: Trivial pericardial effusion is present.   Mitral Valve: The mitral valve is normal in structure. Trivial mitral  valve regurgitation. No evidence of mitral valve stenosis.   Tricuspid Valve: The tricuspid valve is normal in structure. Tricuspid  valve regurgitation is trivial. No evidence of tricuspid stenosis.   Aortic Valve: The aortic valve is tricuspid. Aortic valve regurgitation is  not visualized. Mild aortic valve sclerosis is present, with no evidence  of aortic valve stenosis.   Pulmonic Valve: The pulmonic valve was normal in structure. Pulmonic valve  regurgitation is trivial. No evidence of pulmonic stenosis.   Aorta: The aortic root is normal in size and structure.   Venous: The inferior vena cava is normal in size with greater than 50%  respiratory variability, suggesting right atrial pressure of 3 mmHg.   IAS/Shunts: No atrial level shunt detected by color flow Doppler.       Recent Labs: 03/02/2020: B Natriuretic Peptide 109.0 03/05/2020:  ALT 17 03/06/2020: Hemoglobin 11.3; Platelets 162 03/31/2020: BUN 15; Creatinine, Ser 0.77; Magnesium 1.8; Potassium 4.7; Sodium 142  Recent Lipid Panel    Component Value Date/Time   CHOL 70 03/03/2020 0436   TRIG 68 03/03/2020 0436   HDL 36 (L) 03/03/2020 0436   CHOLHDL 1.9 03/03/2020 0436   VLDL 14 03/03/2020 0436   LDLCALC 20 03/03/2020 0436    Physical Exam:    VS:  BP 136/70   Pulse 68   Ht 5\' 2"  (1.575 m)   Wt 142 lb (64.4 kg)   SpO2 97%   BMI 25.97 kg/m     Wt Readings from Last 3 Encounters:  09/30/20 142 lb (64.4 kg)  07/01/20 135 lb (61.2 kg)  03/31/20 138 lb 6.4 oz (62.8 kg)     GEN: Well nourished, well developed in no acute distress HEENT: Normal NECK: No JVD; No carotid bruits LYMPHATICS: No lymphadenopathy CARDIAC: S1S2 noted,RRR, no murmurs, rubs, gallops RESPIRATORY:  Clear to auscultation without rales, wheezing or rhonchi  ABDOMEN: Soft, non-tender, non-distended, +bowel sounds, no guarding. EXTREMITIES: No edema, No cyanosis, no clubbing MUSCULOSKELETAL:  No deformity  SKIN: Warm and dry NEUROLOGIC:  Alert and oriented x 3, non-focal PSYCHIATRIC:  Normal affect, good insight  ASSESSMENT:    1. Essential hypertension   2. Coronary artery disease of native artery of native heart with stable angina pectoris (HCC)   3. Paroxysmal atrial tachycardia ([place])   4. Type 2 diabetes mellitus with hyperosmolarity without coma, without long-term current use of insulin ([place])   5. Mixed hyperlipidemia    PLAN:     She is doing well from a CV standpoint.  However during our visit while reconciling her medication with her I did see that the patient was on carvedilol as well as Toprol-XL.  We will stop the Toprol-XL and continue patient on carvedilol.  No angina symptoms continue patient on her current regimen  Hyperlipidemia - continue with current statin medication. The patient  is in agreement with the above plan. The patient left the office in stable condition.  The patient will follow up in 6 months or sooner if needed.   Medication Adjustments/Labs and Tests Ordered: Current medicines are reviewed at length with the patient today.  Concerns regarding medicines are outlined above.  No orders of the defined types were placed in this encounter.  No orders of the defined types were placed in this encounter.   Patient Instructions  Medication Instructions:  Your physician has recommended you make the following change in your medication: STOP: Toprol-XL *If you need a refill on your cardiac medications before your next appointment, please call your pharmacy*   Lab Work: None If you have labs (blood work) drawn today and your tests are completely normal, you will receive your results only by: Marland Kitchen MyChart Message (if you have MyChart) OR . A paper copy in the mail If you have any lab test that is abnormal or we need to change your treatment, we will call you to review the results.   Testing/Procedures: None   Follow-Up: At [facility], you and your health needs are our priority.  As part of our continuing mission to provide you with exceptional heart care, we have created designated Provider Care Teams.  These Care Teams include your primary Cardiologist (physician) and Advanced Practice Providers (APPs -  Physician Assistants and Nurse Practitioners) who all work together to provide you with the care you need, when you need it.  We  recommend signing up for the patient portal called "MyChart".  Sign up information is provided on this After Visit Summary.  MyChart is used to connect with patients for Virtual Visits (Telemedicine).  Patients are able to view lab/test results, encounter notes, upcoming appointments, etc.  Non-urgent messages can be sent to your provider as well.   To learn more about what you can do with MyChart, go to [email or web address].     Your next appointment:   6 month(s)  The format for your next appointment:   In Person  Provider:   Berniece Salines, DO   Other Instructions      Adopting a Healthy Lifestyle.  Know what a healthy weight is for you (roughly BMI <25) and aim to maintain this   Aim for 7+ servings of fruits and vegetables daily   65-80+ fluid ounces of water or unsweet tea for healthy kidneys   Limit to max 1 drink of alcohol per day; avoid smoking/tobacco   Limit animal fats in diet for cholesterol and heart health - choose grass fed whenever available   Avoid highly processed foods, and foods high in saturated/trans fats   Aim for low stress - take time to unwind and care for your mental health   Aim for 150 min of moderate intensity exercise weekly for heart health, and weights twice weekly for bone health   Aim for 7-9 hours of sleep daily   When it comes to diets, agreement about the perfect plan isnt easy to find, even among the experts. Experts at the [employer] developed an idea known as the Healthy Eating Plate. Just imagine a plate divided into logical, healthy portions.   The emphasis is on diet quality:   Load up on vegetables and fruits - one-half of your plate: Aim for color and variety, and remember that potatoes dont count.   Go for whole grains - one-quarter of your plate: Whole wheat, barley, wheat berries, quinoa, oats, brown rice, and foods made with them. If you want pasta, go with whole wheat pasta.   Protein power - one-quarter of your plate: Fish, chicken, beans, and nuts are all healthy, versatile protein sources. Limit red meat.   The diet, however, does go beyond the plate, offering a few other suggestions.   Use healthy plant oils, such as olive, canola, soy, corn, sunflower and peanut. Check the labels, and avoid partially hydrogenated oil, which have unhealthy trans fats.   If youre thirsty, drink water. Coffee and tea are good in  moderation, but skip sugary drinks and limit milk and dairy products to one or two daily servings.   The type of carbohydrate in the diet is more important than the amount. Some sources of carbohydrates, such as vegetables, fruits, whole grains, and beans-are healthier than others.   Finally, stay active  Signed, Berniece Salines, DO  09/30/2020 11:51 AM    [place]

## 2020-09-30 NOTE — Patient Instructions (Signed)
Medication Instructions:  Your physician has recommended you make the following change in your medication: STOP: Toprol-XL *If you need a refill on your cardiac medications before your next appointment, please call your pharmacy*   Lab Work: None If you have labs (blood work) drawn today and your tests are completely normal, you will receive your results only by: Marland Kitchen MyChart Message (if you have MyChart) OR . A paper copy in the mail If you have any lab test that is abnormal or we need to change your treatment, we will call you to review the results.   Testing/Procedures: None   Follow-Up: At [facility], you and your health needs are our priority.  As part of our continuing mission to provide you with exceptional heart care, we have created designated Provider Care Teams.  These Care Teams include your primary Cardiologist (physician) and Advanced Practice Providers (APPs -  Physician Assistants and Nurse Practitioners) who all work together to provide you with the care you need, when you need it.  We recommend signing up for the patient portal called "MyChart".  Sign up information is provided on this After Visit Summary.  MyChart is used to connect with patients for Virtual Visits (Telemedicine).  Patients are able to view lab/test results, encounter notes, upcoming appointments, etc.  Non-urgent messages can be sent to your provider as well.   To learn more about what you can do with MyChart, go to [email or web address].    Your next appointment:   6 month(s)  The format for your next appointment:   In Person  Provider:   Berniece Salines, DO   Other Instructions

## 2020-10-02 DIAGNOSIS — R42 Dizziness and giddiness: Secondary | ICD-10-CM | POA: Diagnosis not present

## 2020-10-02 DIAGNOSIS — Z6826 Body mass index (BMI) 26.0-26.9, adult: Secondary | ICD-10-CM | POA: Diagnosis not present

## 2020-10-02 DIAGNOSIS — K580 Irritable bowel syndrome with diarrhea: Secondary | ICD-10-CM | POA: Diagnosis not present

## 2020-10-02 DIAGNOSIS — R61 Generalized hyperhidrosis: Secondary | ICD-10-CM | POA: Diagnosis not present

## 2020-10-02 DIAGNOSIS — I110 Hypertensive heart disease with heart failure: Secondary | ICD-10-CM | POA: Diagnosis not present

## 2020-10-02 DIAGNOSIS — R300 Dysuria: Secondary | ICD-10-CM | POA: Diagnosis not present

## 2020-10-08 DIAGNOSIS — M23322 Other meniscus derangements, posterior horn of medial meniscus, left knee: Secondary | ICD-10-CM | POA: Diagnosis not present

## 2020-10-08 DIAGNOSIS — R519 Headache, unspecified: Secondary | ICD-10-CM | POA: Diagnosis not present

## 2020-10-08 DIAGNOSIS — R42 Dizziness and giddiness: Secondary | ICD-10-CM | POA: Diagnosis not present

## 2020-10-08 DIAGNOSIS — M1712 Unilateral primary osteoarthritis, left knee: Secondary | ICD-10-CM | POA: Diagnosis not present

## 2020-10-08 DIAGNOSIS — R61 Generalized hyperhidrosis: Secondary | ICD-10-CM | POA: Diagnosis not present

## 2020-10-27 DIAGNOSIS — R42 Dizziness and giddiness: Secondary | ICD-10-CM | POA: Diagnosis not present

## 2020-10-27 DIAGNOSIS — R296 Repeated falls: Secondary | ICD-10-CM | POA: Diagnosis not present

## 2020-10-30 DIAGNOSIS — I110 Hypertensive heart disease with heart failure: Secondary | ICD-10-CM | POA: Diagnosis not present

## 2020-10-30 DIAGNOSIS — I081 Rheumatic disorders of both mitral and tricuspid valves: Secondary | ICD-10-CM | POA: Diagnosis not present

## 2020-10-30 DIAGNOSIS — H548 Legal blindness, as defined in USA: Secondary | ICD-10-CM | POA: Diagnosis not present

## 2020-10-30 DIAGNOSIS — I10 Essential (primary) hypertension: Secondary | ICD-10-CM | POA: Diagnosis not present

## 2020-10-30 DIAGNOSIS — I2510 Atherosclerotic heart disease of native coronary artery without angina pectoris: Secondary | ICD-10-CM | POA: Diagnosis not present

## 2020-10-30 DIAGNOSIS — N390 Urinary tract infection, site not specified: Secondary | ICD-10-CM | POA: Diagnosis not present

## 2020-10-30 DIAGNOSIS — I5032 Chronic diastolic (congestive) heart failure: Secondary | ICD-10-CM | POA: Diagnosis not present

## 2020-10-30 DIAGNOSIS — E7800 Pure hypercholesterolemia, unspecified: Secondary | ICD-10-CM | POA: Diagnosis not present

## 2020-10-30 DIAGNOSIS — K219 Gastro-esophageal reflux disease without esophagitis: Secondary | ICD-10-CM | POA: Diagnosis not present

## 2020-10-30 DIAGNOSIS — I700 Atherosclerosis of aorta: Secondary | ICD-10-CM | POA: Diagnosis not present

## 2020-10-30 DIAGNOSIS — J449 Chronic obstructive pulmonary disease, unspecified: Secondary | ICD-10-CM | POA: Diagnosis not present

## 2020-11-03 DIAGNOSIS — I081 Rheumatic disorders of both mitral and tricuspid valves: Secondary | ICD-10-CM | POA: Diagnosis not present

## 2020-11-03 DIAGNOSIS — I700 Atherosclerosis of aorta: Secondary | ICD-10-CM | POA: Diagnosis not present

## 2020-11-03 DIAGNOSIS — K219 Gastro-esophageal reflux disease without esophagitis: Secondary | ICD-10-CM | POA: Diagnosis not present

## 2020-11-03 DIAGNOSIS — I2510 Atherosclerotic heart disease of native coronary artery without angina pectoris: Secondary | ICD-10-CM | POA: Diagnosis not present

## 2020-11-03 DIAGNOSIS — I110 Hypertensive heart disease with heart failure: Secondary | ICD-10-CM | POA: Diagnosis not present

## 2020-11-03 DIAGNOSIS — I5032 Chronic diastolic (congestive) heart failure: Secondary | ICD-10-CM | POA: Diagnosis not present

## 2020-11-03 DIAGNOSIS — H548 Legal blindness, as defined in USA: Secondary | ICD-10-CM | POA: Diagnosis not present

## 2020-11-03 DIAGNOSIS — E7800 Pure hypercholesterolemia, unspecified: Secondary | ICD-10-CM | POA: Diagnosis not present

## 2020-11-03 DIAGNOSIS — J449 Chronic obstructive pulmonary disease, unspecified: Secondary | ICD-10-CM | POA: Diagnosis not present

## 2020-11-04 DIAGNOSIS — H548 Legal blindness, as defined in USA: Secondary | ICD-10-CM | POA: Diagnosis not present

## 2020-11-04 DIAGNOSIS — I081 Rheumatic disorders of both mitral and tricuspid valves: Secondary | ICD-10-CM | POA: Diagnosis not present

## 2020-11-04 DIAGNOSIS — E7800 Pure hypercholesterolemia, unspecified: Secondary | ICD-10-CM | POA: Diagnosis not present

## 2020-11-04 DIAGNOSIS — I5032 Chronic diastolic (congestive) heart failure: Secondary | ICD-10-CM | POA: Diagnosis not present

## 2020-11-04 DIAGNOSIS — J449 Chronic obstructive pulmonary disease, unspecified: Secondary | ICD-10-CM | POA: Diagnosis not present

## 2020-11-04 DIAGNOSIS — I700 Atherosclerosis of aorta: Secondary | ICD-10-CM | POA: Diagnosis not present

## 2020-11-04 DIAGNOSIS — I110 Hypertensive heart disease with heart failure: Secondary | ICD-10-CM | POA: Diagnosis not present

## 2020-11-04 DIAGNOSIS — K219 Gastro-esophageal reflux disease without esophagitis: Secondary | ICD-10-CM | POA: Diagnosis not present

## 2020-11-04 DIAGNOSIS — I2510 Atherosclerotic heart disease of native coronary artery without angina pectoris: Secondary | ICD-10-CM | POA: Diagnosis not present

## 2020-11-05 DIAGNOSIS — H548 Legal blindness, as defined in USA: Secondary | ICD-10-CM | POA: Diagnosis not present

## 2020-11-05 DIAGNOSIS — K219 Gastro-esophageal reflux disease without esophagitis: Secondary | ICD-10-CM | POA: Diagnosis not present

## 2020-11-05 DIAGNOSIS — J449 Chronic obstructive pulmonary disease, unspecified: Secondary | ICD-10-CM | POA: Diagnosis not present

## 2020-11-05 DIAGNOSIS — E7800 Pure hypercholesterolemia, unspecified: Secondary | ICD-10-CM | POA: Diagnosis not present

## 2020-11-05 DIAGNOSIS — I110 Hypertensive heart disease with heart failure: Secondary | ICD-10-CM | POA: Diagnosis not present

## 2020-11-05 DIAGNOSIS — I700 Atherosclerosis of aorta: Secondary | ICD-10-CM | POA: Diagnosis not present

## 2020-11-05 DIAGNOSIS — I2510 Atherosclerotic heart disease of native coronary artery without angina pectoris: Secondary | ICD-10-CM | POA: Diagnosis not present

## 2020-11-05 DIAGNOSIS — I081 Rheumatic disorders of both mitral and tricuspid valves: Secondary | ICD-10-CM | POA: Diagnosis not present

## 2020-11-05 DIAGNOSIS — I5032 Chronic diastolic (congestive) heart failure: Secondary | ICD-10-CM | POA: Diagnosis not present

## 2020-11-06 ENCOUNTER — Encounter: Payer: Self-pay | Admitting: Neurology

## 2020-11-10 DIAGNOSIS — I110 Hypertensive heart disease with heart failure: Secondary | ICD-10-CM | POA: Diagnosis not present

## 2020-11-10 DIAGNOSIS — I081 Rheumatic disorders of both mitral and tricuspid valves: Secondary | ICD-10-CM | POA: Diagnosis not present

## 2020-11-10 DIAGNOSIS — H548 Legal blindness, as defined in USA: Secondary | ICD-10-CM | POA: Diagnosis not present

## 2020-11-10 DIAGNOSIS — I2510 Atherosclerotic heart disease of native coronary artery without angina pectoris: Secondary | ICD-10-CM | POA: Diagnosis not present

## 2020-11-10 DIAGNOSIS — J449 Chronic obstructive pulmonary disease, unspecified: Secondary | ICD-10-CM | POA: Diagnosis not present

## 2020-11-10 DIAGNOSIS — I700 Atherosclerosis of aorta: Secondary | ICD-10-CM | POA: Diagnosis not present

## 2020-11-10 DIAGNOSIS — I5032 Chronic diastolic (congestive) heart failure: Secondary | ICD-10-CM | POA: Diagnosis not present

## 2020-11-10 DIAGNOSIS — E7800 Pure hypercholesterolemia, unspecified: Secondary | ICD-10-CM | POA: Diagnosis not present

## 2020-11-10 DIAGNOSIS — K219 Gastro-esophageal reflux disease without esophagitis: Secondary | ICD-10-CM | POA: Diagnosis not present

## 2020-11-12 ENCOUNTER — Encounter: Payer: Self-pay | Admitting: Sports Medicine

## 2020-11-12 ENCOUNTER — Ambulatory Visit (INDEPENDENT_AMBULATORY_CARE_PROVIDER_SITE_OTHER): Payer: Medicare HMO | Admitting: Sports Medicine

## 2020-11-12 ENCOUNTER — Other Ambulatory Visit: Payer: Self-pay

## 2020-11-12 DIAGNOSIS — M79675 Pain in left toe(s): Secondary | ICD-10-CM | POA: Diagnosis not present

## 2020-11-12 DIAGNOSIS — L608 Other nail disorders: Secondary | ICD-10-CM | POA: Diagnosis not present

## 2020-11-12 DIAGNOSIS — M79674 Pain in right toe(s): Secondary | ICD-10-CM | POA: Diagnosis not present

## 2020-11-12 DIAGNOSIS — L600 Ingrowing nail: Secondary | ICD-10-CM

## 2020-11-12 MED ORDER — NEOMYCIN-POLYMYXIN-HC 3.5-10000-1 OT SOLN: OTIC | 0 refills | Status: DC

## 2020-11-12 NOTE — Progress Notes (Signed)
Subjective: Heather Mccoy is a 75 y.o. female patient presents to office today complaining of a moderately painful incurvated, swollen and red left great toenail states that she also has some pain on the right in the past has previously had her ingrown corners trimmed out but still remains painful.  Patient continues to try Epson salt without improvement.  Patient wants to discuss further treatment options.  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  Patient Active Problem List   Diagnosis Date Noted   Polypharmacy    Epigastric pain    Nausea and vomiting    Orthostatic dizziness 03/03/2020   Aortic valve sclerosis    Unsteady gait    Hypotension    Anxiety    Arthritis    Cancer (HCC)    COPD (chronic obstructive pulmonary disease) (HCC)    Dysrhythmia    Hyperlipidemia    Mood disorder (HCC)    Orthostatic hypotension    Shortness of breath    Dizziness 09/18/2019   Bell's palsy 07/31/2019   Mixed hyperlipidemia 06/11/2019   Essential hypertension 06/11/2019   Coronary artery disease of native artery of native heart with stable angina pectoris ([place]) 06/11/2019   Paroxysmal atrial tachycardia ([place]) 06/11/2019   Hypertension 01/19/2019   Hyperlipemia 01/19/2019   Type II diabetes mellitus ([place]) 01/19/2019   Vitamin B12 deficiency 09/13/2014   Normocytic anemia 09/12/2014   Weight loss 09/12/2014   Chronic diarrhea of unknown origin 01/12/2012    Current Outpatient Medications on File Prior to Visit  Medication Sig Dispense Refill   alendronate (FOSAMAX) 70 MG tablet Take 70 mg by mouth every Sunday. Take with a full glass of water on an empty stomach.     aspirin EC 81 MG tablet Take 81 mg by mouth daily.     atorvastatin (LIPITOR) 40 MG tablet Take 40 mg by mouth daily.     carvedilol (COREG) 12.5 MG tablet Take 12.5 mg by mouth 2 (two) times daily with a meal.      citalopram (CELEXA) 10 MG tablet Take 10 mg by mouth daily.     diazepam (VALIUM) 5 MG tablet Take 5 mg by mouth 2 (two) times daily.     dicyclomine (BENTYL) 20 MG tablet Take 20 mg by mouth 3 (three) times daily.     diphenoxylate-atropine (LOMOTIL) 2.5-0.025 MG tablet Take 2 tablets by mouth 4 (four) times daily.     famotidine (PEPCID) 40 MG tablet Take 40 mg by mouth 2 (two) times daily.     FEROSUL 325 (65 Fe) MG tablet Take 325 mg by mouth daily.     flecainide (TAMBOCOR) 50 MG tablet Take 1 tablet (50 mg total) by mouth every 12 (twelve) hours. 30 tablet 0   furosemide (LASIX) 20 MG tablet Take 20 mg by mouth daily as needed for edema.     hydrALAZINE (APRESOLINE) 25 MG tablet Take 25 mg by mouth 2 (two) times daily.     lidocaine (LIDODERM) 5 % Place 1 patch onto the skin daily. Remove & Discard patch within 12 hours or as directed by MD 30 patch 0   loperamide (IMODIUM) 2 MG capsule Take 2 mg by mouth every 6 (six) hours as needed for diarrhea or loose stools.     meloxicam (MOBIC) 7.5 MG tablet Take 7.5 mg by mouth 2 (two) times daily.     methocarbamol (ROBAXIN) 750  MG tablet Take 750 mg by mouth 2 (two) times daily.     midodrine (PROAMATINE) 5 MG tablet Take 5 mg by mouth 3 (three) times daily with meals.     montelukast (SINGULAIR) 10 MG tablet TAKE 1 TABLET BY MOUTH AT BEDTIME 90 tablet 3   ondansetron (ZOFRAN) 4 MG tablet Take 4 mg by mouth 2 (two) times daily as needed for nausea.     pantoprazole (PROTONIX) 40 MG tablet Take 40 mg by mouth daily.     potassium chloride SA (KLOR-CON) 20 MEQ tablet Take 20 mEq by mouth daily.     ranolazine (RANEXA) 500 MG 12 hr tablet Take 1,000 mg by mouth 2 (two) times daily.     No current facility-administered medications on file prior to visit.    Allergies  Allergen Reactions   Codeine Hives   Morphine Dermatitis   Nitroglycerin Nausea And Vomiting   Tylenol [Acetaminophen]    Oxycodone Rash    Can take with benadryl.     Objective:  There were no vitals filed for this visit.  General: Well developed, nourished, in no acute distress, alert and oriented x3   Dermatology: Skin is warm, dry and supple bilateral.  Left greater than right hallux nail appears to be  severely incurvated with hyperkeratosis formation at the distal aspects of  the medial and lateral nail border with significant pincer deformity. (+) Erythema. (+) Edema. (-) serosanguous drainage present. The right hallux nail is not acute and has significant subungual debris and onychomycosis.  Remaining nails appear unremarkable at this time. There are no open sores, lesions or other signs of infection  present.  Vascular: Dorsalis Pedis artery 2/4 and Posterior Tibial artery pedal pulses are 1/4 bilateral with capillary fill time 3 seconds.  Scant pedal hair growth present.  Varicosities bilateral.  No lower extremity edema.   Neruologic: Grossly intact via light touch bilateral.  Musculoskeletal: Tenderness to palpation of the left greater than right hallux nail and nail folds.  Muscular strength within normal limits in all groups bilateral.   Assesement and Plan: Problem List Items Addressed This Visit   None Visit Diagnoses     Ingrown nail    -  Primary   Pincer nail deformity       Toe pain, bilateral            -Discussed treatment alternatives and plan of care; Explained permanent/temporary nail avulsion and post procedure course to patient. Patient elects for permanent removal of left hallux nail completely. - After a verbal and written consent, injected 3 ml of a 50:50 mixture of 2% plain  lidocaine and 0.5% plain marcaine in a normal hallux block fashion. Next, a  betadine prep was performed. Anesthesia was tested and found to be appropriate.  The offending left hallux nail completely was then incised from the hyponychium to the epinychium. The offending nail border was removed and cleared from the field. The area was  curretted for any remaining nail or spicules. Phenol application performed and the area was then flushed with alcohol and dressed with antibiotic cream and a dry sterile dressing. -Patient was instructed to leave the dressing intact for today and begin soaking  in a weak solution of betadine or Epsom salt and water tomorrow. Patient was instructed to  soak for 15-20 minutes each day and apply neosporin/corticosporin and a gauze or bandaid dressing each day. -Patient was instructed to monitor the toe for signs of infection and return to office  if toe becomes red, hot or swollen. -Advised ice, elevation, and Aleve if needed for pain.  -Patient is to return in 3-4 weeks for follow up care/nail check or sooner if problems arise.  Advised patient if her left toenail is well-healed at the next visit then we can proceed with trying to do a removal of her right great toenail.  Advised patient due to her age and risk factors of infection or poor healing it is best that we only do 1 toe at this time.  Patient is understanding the fact that there are risk associated with this procedure and still gave verbal consent as above and written consent to proceed.  Landis Martins, DPM

## 2020-11-12 NOTE — Patient Instructions (Signed)

## 2020-11-13 DIAGNOSIS — I700 Atherosclerosis of aorta: Secondary | ICD-10-CM | POA: Diagnosis not present

## 2020-11-13 DIAGNOSIS — H548 Legal blindness, as defined in USA: Secondary | ICD-10-CM | POA: Diagnosis not present

## 2020-11-13 DIAGNOSIS — I110 Hypertensive heart disease with heart failure: Secondary | ICD-10-CM | POA: Diagnosis not present

## 2020-11-13 DIAGNOSIS — I5032 Chronic diastolic (congestive) heart failure: Secondary | ICD-10-CM | POA: Diagnosis not present

## 2020-11-13 DIAGNOSIS — K219 Gastro-esophageal reflux disease without esophagitis: Secondary | ICD-10-CM | POA: Diagnosis not present

## 2020-11-13 DIAGNOSIS — E7800 Pure hypercholesterolemia, unspecified: Secondary | ICD-10-CM | POA: Diagnosis not present

## 2020-11-13 DIAGNOSIS — I2510 Atherosclerotic heart disease of native coronary artery without angina pectoris: Secondary | ICD-10-CM | POA: Diagnosis not present

## 2020-11-13 DIAGNOSIS — I081 Rheumatic disorders of both mitral and tricuspid valves: Secondary | ICD-10-CM | POA: Diagnosis not present

## 2020-11-13 DIAGNOSIS — J449 Chronic obstructive pulmonary disease, unspecified: Secondary | ICD-10-CM | POA: Diagnosis not present

## 2020-11-18 ENCOUNTER — Telehealth: Payer: Self-pay | Admitting: Cardiology

## 2020-11-18 ENCOUNTER — Telehealth: Payer: Self-pay

## 2020-11-18 DIAGNOSIS — K219 Gastro-esophageal reflux disease without esophagitis: Secondary | ICD-10-CM | POA: Diagnosis not present

## 2020-11-18 DIAGNOSIS — I700 Atherosclerosis of aorta: Secondary | ICD-10-CM | POA: Diagnosis not present

## 2020-11-18 DIAGNOSIS — J449 Chronic obstructive pulmonary disease, unspecified: Secondary | ICD-10-CM | POA: Diagnosis not present

## 2020-11-18 DIAGNOSIS — I081 Rheumatic disorders of both mitral and tricuspid valves: Secondary | ICD-10-CM | POA: Diagnosis not present

## 2020-11-18 DIAGNOSIS — E7800 Pure hypercholesterolemia, unspecified: Secondary | ICD-10-CM | POA: Diagnosis not present

## 2020-11-18 DIAGNOSIS — H548 Legal blindness, as defined in USA: Secondary | ICD-10-CM | POA: Diagnosis not present

## 2020-11-18 DIAGNOSIS — K5900 Constipation, unspecified: Secondary | ICD-10-CM | POA: Diagnosis not present

## 2020-11-18 DIAGNOSIS — I5032 Chronic diastolic (congestive) heart failure: Secondary | ICD-10-CM | POA: Diagnosis not present

## 2020-11-18 DIAGNOSIS — I110 Hypertensive heart disease with heart failure: Secondary | ICD-10-CM | POA: Diagnosis not present

## 2020-11-18 DIAGNOSIS — I2510 Atherosclerotic heart disease of native coronary artery without angina pectoris: Secondary | ICD-10-CM | POA: Diagnosis not present

## 2020-11-18 NOTE — Telephone Encounter (Signed)
Spoke with patient, see chart.    

## 2020-11-18 NOTE — Telephone Encounter (Signed)
Spoke with patient, she states "the home health nurse said my heart was running away". When asked how fast her heart was going she was unable to tell me. She is going to reach out to the nurse and call me back wit the information., Patient was taken off of Toprol-XL at her last visit with Dr. Harriet Masson.

## 2020-11-18 NOTE — Telephone Encounter (Signed)
Alley is calling stating she has misplaced one of her medications prescribed by Dr. Harriet Masson and does not know the name of it or what letter it starts with. Was unable to assist pt with refill request due to being prescribed multiple medications by her. Please advise.

## 2020-11-19 DIAGNOSIS — E7800 Pure hypercholesterolemia, unspecified: Secondary | ICD-10-CM | POA: Diagnosis not present

## 2020-11-19 DIAGNOSIS — I700 Atherosclerosis of aorta: Secondary | ICD-10-CM | POA: Diagnosis not present

## 2020-11-19 DIAGNOSIS — I110 Hypertensive heart disease with heart failure: Secondary | ICD-10-CM | POA: Diagnosis not present

## 2020-11-19 DIAGNOSIS — K219 Gastro-esophageal reflux disease without esophagitis: Secondary | ICD-10-CM | POA: Diagnosis not present

## 2020-11-19 DIAGNOSIS — J449 Chronic obstructive pulmonary disease, unspecified: Secondary | ICD-10-CM | POA: Diagnosis not present

## 2020-11-19 DIAGNOSIS — I2510 Atherosclerotic heart disease of native coronary artery without angina pectoris: Secondary | ICD-10-CM | POA: Diagnosis not present

## 2020-11-19 DIAGNOSIS — I5032 Chronic diastolic (congestive) heart failure: Secondary | ICD-10-CM | POA: Diagnosis not present

## 2020-11-19 DIAGNOSIS — H548 Legal blindness, as defined in USA: Secondary | ICD-10-CM | POA: Diagnosis not present

## 2020-11-19 DIAGNOSIS — I081 Rheumatic disorders of both mitral and tricuspid valves: Secondary | ICD-10-CM | POA: Diagnosis not present

## 2020-11-20 DIAGNOSIS — H44113 Panuveitis, bilateral: Secondary | ICD-10-CM | POA: Diagnosis not present

## 2020-11-20 DIAGNOSIS — H547 Unspecified visual loss: Secondary | ICD-10-CM | POA: Diagnosis not present

## 2020-11-20 DIAGNOSIS — M23322 Other meniscus derangements, posterior horn of medial meniscus, left knee: Secondary | ICD-10-CM | POA: Diagnosis not present

## 2020-11-20 DIAGNOSIS — M1712 Unilateral primary osteoarthritis, left knee: Secondary | ICD-10-CM | POA: Diagnosis not present

## 2020-11-22 DIAGNOSIS — I2510 Atherosclerotic heart disease of native coronary artery without angina pectoris: Secondary | ICD-10-CM | POA: Diagnosis not present

## 2020-11-22 DIAGNOSIS — J449 Chronic obstructive pulmonary disease, unspecified: Secondary | ICD-10-CM | POA: Diagnosis not present

## 2020-11-22 DIAGNOSIS — I081 Rheumatic disorders of both mitral and tricuspid valves: Secondary | ICD-10-CM | POA: Diagnosis not present

## 2020-11-22 DIAGNOSIS — H548 Legal blindness, as defined in USA: Secondary | ICD-10-CM | POA: Diagnosis not present

## 2020-11-22 DIAGNOSIS — I5032 Chronic diastolic (congestive) heart failure: Secondary | ICD-10-CM | POA: Diagnosis not present

## 2020-11-22 DIAGNOSIS — K219 Gastro-esophageal reflux disease without esophagitis: Secondary | ICD-10-CM | POA: Diagnosis not present

## 2020-11-22 DIAGNOSIS — I700 Atherosclerosis of aorta: Secondary | ICD-10-CM | POA: Diagnosis not present

## 2020-11-22 DIAGNOSIS — E7800 Pure hypercholesterolemia, unspecified: Secondary | ICD-10-CM | POA: Diagnosis not present

## 2020-11-22 DIAGNOSIS — I110 Hypertensive heart disease with heart failure: Secondary | ICD-10-CM | POA: Diagnosis not present

## 2020-11-24 DIAGNOSIS — I5032 Chronic diastolic (congestive) heart failure: Secondary | ICD-10-CM | POA: Diagnosis not present

## 2020-11-24 DIAGNOSIS — J449 Chronic obstructive pulmonary disease, unspecified: Secondary | ICD-10-CM | POA: Diagnosis not present

## 2020-11-24 DIAGNOSIS — H548 Legal blindness, as defined in USA: Secondary | ICD-10-CM | POA: Diagnosis not present

## 2020-11-24 DIAGNOSIS — E7800 Pure hypercholesterolemia, unspecified: Secondary | ICD-10-CM | POA: Diagnosis not present

## 2020-11-24 DIAGNOSIS — I110 Hypertensive heart disease with heart failure: Secondary | ICD-10-CM | POA: Diagnosis not present

## 2020-11-24 DIAGNOSIS — I2510 Atherosclerotic heart disease of native coronary artery without angina pectoris: Secondary | ICD-10-CM | POA: Diagnosis not present

## 2020-11-24 DIAGNOSIS — I081 Rheumatic disorders of both mitral and tricuspid valves: Secondary | ICD-10-CM | POA: Diagnosis not present

## 2020-11-24 DIAGNOSIS — K219 Gastro-esophageal reflux disease without esophagitis: Secondary | ICD-10-CM | POA: Diagnosis not present

## 2020-11-24 DIAGNOSIS — I700 Atherosclerosis of aorta: Secondary | ICD-10-CM | POA: Diagnosis not present

## 2020-11-26 DIAGNOSIS — I5032 Chronic diastolic (congestive) heart failure: Secondary | ICD-10-CM | POA: Diagnosis not present

## 2020-11-26 DIAGNOSIS — I081 Rheumatic disorders of both mitral and tricuspid valves: Secondary | ICD-10-CM | POA: Diagnosis not present

## 2020-11-26 DIAGNOSIS — E7800 Pure hypercholesterolemia, unspecified: Secondary | ICD-10-CM | POA: Diagnosis not present

## 2020-11-26 DIAGNOSIS — I700 Atherosclerosis of aorta: Secondary | ICD-10-CM | POA: Diagnosis not present

## 2020-11-26 DIAGNOSIS — K219 Gastro-esophageal reflux disease without esophagitis: Secondary | ICD-10-CM | POA: Diagnosis not present

## 2020-11-26 DIAGNOSIS — I110 Hypertensive heart disease with heart failure: Secondary | ICD-10-CM | POA: Diagnosis not present

## 2020-11-26 DIAGNOSIS — I2510 Atherosclerotic heart disease of native coronary artery without angina pectoris: Secondary | ICD-10-CM | POA: Diagnosis not present

## 2020-11-26 DIAGNOSIS — H548 Legal blindness, as defined in USA: Secondary | ICD-10-CM | POA: Diagnosis not present

## 2020-11-26 DIAGNOSIS — J449 Chronic obstructive pulmonary disease, unspecified: Secondary | ICD-10-CM | POA: Diagnosis not present

## 2020-11-29 DIAGNOSIS — E7800 Pure hypercholesterolemia, unspecified: Secondary | ICD-10-CM | POA: Diagnosis not present

## 2020-11-29 DIAGNOSIS — H548 Legal blindness, as defined in USA: Secondary | ICD-10-CM | POA: Diagnosis not present

## 2020-11-29 DIAGNOSIS — K219 Gastro-esophageal reflux disease without esophagitis: Secondary | ICD-10-CM | POA: Diagnosis not present

## 2020-11-29 DIAGNOSIS — I081 Rheumatic disorders of both mitral and tricuspid valves: Secondary | ICD-10-CM | POA: Diagnosis not present

## 2020-11-29 DIAGNOSIS — I5032 Chronic diastolic (congestive) heart failure: Secondary | ICD-10-CM | POA: Diagnosis not present

## 2020-11-29 DIAGNOSIS — I110 Hypertensive heart disease with heart failure: Secondary | ICD-10-CM | POA: Diagnosis not present

## 2020-11-29 DIAGNOSIS — I2510 Atherosclerotic heart disease of native coronary artery without angina pectoris: Secondary | ICD-10-CM | POA: Diagnosis not present

## 2020-11-29 DIAGNOSIS — I700 Atherosclerosis of aorta: Secondary | ICD-10-CM | POA: Diagnosis not present

## 2020-11-29 DIAGNOSIS — J449 Chronic obstructive pulmonary disease, unspecified: Secondary | ICD-10-CM | POA: Diagnosis not present

## 2020-12-01 DIAGNOSIS — H548 Legal blindness, as defined in USA: Secondary | ICD-10-CM | POA: Diagnosis not present

## 2020-12-01 DIAGNOSIS — I110 Hypertensive heart disease with heart failure: Secondary | ICD-10-CM | POA: Diagnosis not present

## 2020-12-01 DIAGNOSIS — I2510 Atherosclerotic heart disease of native coronary artery without angina pectoris: Secondary | ICD-10-CM | POA: Diagnosis not present

## 2020-12-01 DIAGNOSIS — J449 Chronic obstructive pulmonary disease, unspecified: Secondary | ICD-10-CM | POA: Diagnosis not present

## 2020-12-01 DIAGNOSIS — E7800 Pure hypercholesterolemia, unspecified: Secondary | ICD-10-CM | POA: Diagnosis not present

## 2020-12-01 DIAGNOSIS — I5032 Chronic diastolic (congestive) heart failure: Secondary | ICD-10-CM | POA: Diagnosis not present

## 2020-12-01 DIAGNOSIS — I700 Atherosclerosis of aorta: Secondary | ICD-10-CM | POA: Diagnosis not present

## 2020-12-01 DIAGNOSIS — I081 Rheumatic disorders of both mitral and tricuspid valves: Secondary | ICD-10-CM | POA: Diagnosis not present

## 2020-12-01 DIAGNOSIS — K219 Gastro-esophageal reflux disease without esophagitis: Secondary | ICD-10-CM | POA: Diagnosis not present

## 2020-12-08 DIAGNOSIS — J449 Chronic obstructive pulmonary disease, unspecified: Secondary | ICD-10-CM | POA: Diagnosis not present

## 2020-12-08 DIAGNOSIS — K219 Gastro-esophageal reflux disease without esophagitis: Secondary | ICD-10-CM | POA: Diagnosis not present

## 2020-12-08 DIAGNOSIS — I700 Atherosclerosis of aorta: Secondary | ICD-10-CM | POA: Diagnosis not present

## 2020-12-08 DIAGNOSIS — E7800 Pure hypercholesterolemia, unspecified: Secondary | ICD-10-CM | POA: Diagnosis not present

## 2020-12-08 DIAGNOSIS — I2510 Atherosclerotic heart disease of native coronary artery without angina pectoris: Secondary | ICD-10-CM | POA: Diagnosis not present

## 2020-12-08 DIAGNOSIS — I5032 Chronic diastolic (congestive) heart failure: Secondary | ICD-10-CM | POA: Diagnosis not present

## 2020-12-08 DIAGNOSIS — H548 Legal blindness, as defined in USA: Secondary | ICD-10-CM | POA: Diagnosis not present

## 2020-12-08 DIAGNOSIS — I110 Hypertensive heart disease with heart failure: Secondary | ICD-10-CM | POA: Diagnosis not present

## 2020-12-08 DIAGNOSIS — I081 Rheumatic disorders of both mitral and tricuspid valves: Secondary | ICD-10-CM | POA: Diagnosis not present

## 2020-12-10 ENCOUNTER — Ambulatory Visit: Payer: Medicare HMO | Admitting: Sports Medicine

## 2020-12-10 DIAGNOSIS — I700 Atherosclerosis of aorta: Secondary | ICD-10-CM | POA: Diagnosis not present

## 2020-12-10 DIAGNOSIS — I110 Hypertensive heart disease with heart failure: Secondary | ICD-10-CM | POA: Diagnosis not present

## 2020-12-10 DIAGNOSIS — H548 Legal blindness, as defined in USA: Secondary | ICD-10-CM | POA: Diagnosis not present

## 2020-12-10 DIAGNOSIS — K219 Gastro-esophageal reflux disease without esophagitis: Secondary | ICD-10-CM | POA: Diagnosis not present

## 2020-12-10 DIAGNOSIS — I2510 Atherosclerotic heart disease of native coronary artery without angina pectoris: Secondary | ICD-10-CM | POA: Diagnosis not present

## 2020-12-10 DIAGNOSIS — E7800 Pure hypercholesterolemia, unspecified: Secondary | ICD-10-CM | POA: Diagnosis not present

## 2020-12-10 DIAGNOSIS — I081 Rheumatic disorders of both mitral and tricuspid valves: Secondary | ICD-10-CM | POA: Diagnosis not present

## 2020-12-10 DIAGNOSIS — J449 Chronic obstructive pulmonary disease, unspecified: Secondary | ICD-10-CM | POA: Diagnosis not present

## 2020-12-10 DIAGNOSIS — I5032 Chronic diastolic (congestive) heart failure: Secondary | ICD-10-CM | POA: Diagnosis not present

## 2020-12-12 ENCOUNTER — Other Ambulatory Visit: Payer: Self-pay

## 2020-12-12 ENCOUNTER — Encounter: Payer: Self-pay | Admitting: Sports Medicine

## 2020-12-12 ENCOUNTER — Ambulatory Visit (INDEPENDENT_AMBULATORY_CARE_PROVIDER_SITE_OTHER): Payer: Medicare HMO | Admitting: Sports Medicine

## 2020-12-12 DIAGNOSIS — M79674 Pain in right toe(s): Secondary | ICD-10-CM

## 2020-12-12 DIAGNOSIS — Z9889 Other specified postprocedural states: Secondary | ICD-10-CM

## 2020-12-12 DIAGNOSIS — M79675 Pain in left toe(s): Secondary | ICD-10-CM

## 2020-12-12 DIAGNOSIS — L608 Other nail disorders: Secondary | ICD-10-CM

## 2020-12-12 DIAGNOSIS — L600 Ingrowing nail: Secondary | ICD-10-CM

## 2020-12-12 NOTE — Progress Notes (Signed)
Subjective: Heather Mccoy is a 75 y.o. female patient returns to office today for follow up evaluation after having Left Hallux total permanent nail avulsion performed on (11/12/20). Patient has been soaking using epsom salt and applying topical antibiotic covered with bandaid daily. States that it is sore at the base of the toe and to the tip can't wear a normal shoe right now. Patient deniesfever/chills/nausea/vomitting/any other related constitutional symptoms at this time.  Patient Active Problem List   Diagnosis Date Noted   Polypharmacy    Epigastric pain    Nausea and vomiting    Orthostatic dizziness 03/03/2020   Aortic valve sclerosis    Unsteady gait    Hypotension    Anxiety    Arthritis    Cancer (HCC)    COPD (chronic obstructive pulmonary disease) (HCC)    Dysrhythmia    Hyperlipidemia    Mood disorder (HCC)    Orthostatic hypotension    Shortness of breath    Dizziness 09/18/2019   Bell's palsy 07/31/2019   Mixed hyperlipidemia 06/11/2019   Essential hypertension 06/11/2019   Coronary artery disease of native artery of native heart with stable angina pectoris ([place]) 06/11/2019   Paroxysmal atrial tachycardia ([place]) 06/11/2019   Hypertension 01/19/2019   Hyperlipemia 01/19/2019   Type II diabetes mellitus ([place]) 01/19/2019   Vitamin B12 deficiency 09/13/2014   Normocytic anemia 09/12/2014   Weight loss 09/12/2014   Chronic diarrhea of unknown origin 01/12/2012    Current Outpatient Medications on File Prior to Visit  Medication Sig Dispense Refill   alendronate (FOSAMAX) 70 MG tablet Take 70 mg by mouth every Sunday. Take with a full glass of water on an empty stomach.     aspirin EC 81 MG tablet Take 81 mg by mouth daily.     atorvastatin (LIPITOR) 40 MG tablet Take 40 mg by mouth daily.     carvedilol (COREG) 12.5 MG tablet Take 12.5 mg by mouth 2 (two) times daily with a meal.     citalopram (CELEXA) 10 MG tablet Take 10 mg by mouth daily.     diazepam  (VALIUM) 5 MG tablet Take 5 mg by mouth 2 (two) times daily.     dicyclomine (BENTYL) 20 MG tablet Take 20 mg by mouth 3 (three) times daily.     diphenoxylate-atropine (LOMOTIL) 2.5-0.025 MG tablet Take 2 tablets by mouth 4 (four) times daily.     famotidine (PEPCID) 40 MG tablet Take 40 mg by mouth 2 (two) times daily.     FEROSUL 325 (65 Fe) MG tablet Take 325 mg by mouth daily.     flecainide (TAMBOCOR) 50 MG tablet Take 1 tablet (50 mg total) by mouth every 12 (twelve) hours. 30 tablet 0   furosemide (LASIX) 20 MG tablet Take 20 mg by mouth daily as needed for edema.     hydrALAZINE (APRESOLINE) 25 MG tablet Take 25 mg by mouth 2 (two) times daily.     lidocaine (LIDODERM) 5 % Place 1 patch onto the skin daily. Remove & Discard patch within 12 hours or as directed by MD 30 patch 0   loperamide (IMODIUM) 2 MG capsule Take 2 mg by mouth every 6 (six) hours as needed for diarrhea or loose stools.     meloxicam (MOBIC) 7.5 MG tablet Take 7.5 mg by mouth 2 (two) times daily.     methocarbamol (ROBAXIN) 750 MG tablet Take 750 mg by mouth 2 (two) times daily.     midodrine (PROAMATINE)  5 MG tablet Take 5 mg by mouth 3 (three) times daily with meals.     montelukast (SINGULAIR) 10 MG tablet TAKE 1 TABLET BY MOUTH AT BEDTIME 90 tablet 3   neomycin-polymyxin-hydrocortisone (CORTISPORIN) OTIC solution Apply 2-3 drops to the ingrown toenail site twice daily. Cover with band-aid. 10 mL 0   ondansetron (ZOFRAN) 4 MG tablet Take 4 mg by mouth 2 (two) times daily as needed for nausea.     pantoprazole (PROTONIX) 40 MG tablet Take 40 mg by mouth daily.     potassium chloride SA (KLOR-CON) 20 MEQ tablet Take 20 mEq by mouth daily.     ranolazine (RANEXA) 500 MG 12 hr tablet Take 1,000 mg by mouth 2 (two) times daily.     No current facility-administered medications on file prior to visit.    Allergies  Allergen Reactions   Codeine Hives   Morphine Dermatitis   Nitroglycerin Nausea And Vomiting    Tylenol [Acetaminophen]    Oxycodone Rash    Can take with benadryl.    Objective:  General: Well developed, nourished, in no acute distress, alert and oriented x3   Dermatology: Skin is warm, dry and supple bilateral. Left hallux nail bed appears to be clean, dry, with mild granular tissue and surrounding eschar/scab. (+) Erythema at proximal nail fold. (-) Edema. (-) serosanguous drainage present. To Right great toe there is Pincer deformity. The remaining nails appear unremarkable at this time. There are no other lesions or other signs of infection  present.  Neurovascular status: Intact. No lower extremity swelling; No pain with calf compression bilateral.  Musculoskeletal: + Pain to left 1st toe. Muscular strength within normal limits bilateral.   Assesement and Plan: Problem List Items Addressed This Visit   None Visit Diagnoses     S/P nail surgery    -  Primary   Ingrown nail       Pincer nail deformity       Toe pain, bilateral           -Examined patient  -Cleansed Left hallux nail fold and gently scrubbed with peroxide and q-tip/curetted away eschar at site and applied antibiotic cream covered with bandaid.  -Discussed plan of care with patient. -Patient to soak if there is still pain and to continue with cortisporin until pain has resolved. May leave open to air at night. -Educated patient on long term care after nail surgery. -Patient was instructed to monitor the toe for reoccurrence and signs of infection; Patient advised to return to office or go to ER if toe becomes red, hot or swollen. -Patient is to return 8 weeks for nail procedure on right. Did not do procedure today because the left toe is still healing.   Landis Martins, DPM

## 2020-12-19 DIAGNOSIS — I700 Atherosclerosis of aorta: Secondary | ICD-10-CM | POA: Diagnosis not present

## 2020-12-19 DIAGNOSIS — I5032 Chronic diastolic (congestive) heart failure: Secondary | ICD-10-CM | POA: Diagnosis not present

## 2020-12-19 DIAGNOSIS — J449 Chronic obstructive pulmonary disease, unspecified: Secondary | ICD-10-CM | POA: Diagnosis not present

## 2020-12-19 DIAGNOSIS — H548 Legal blindness, as defined in USA: Secondary | ICD-10-CM | POA: Diagnosis not present

## 2020-12-19 DIAGNOSIS — E7800 Pure hypercholesterolemia, unspecified: Secondary | ICD-10-CM | POA: Diagnosis not present

## 2020-12-19 DIAGNOSIS — I081 Rheumatic disorders of both mitral and tricuspid valves: Secondary | ICD-10-CM | POA: Diagnosis not present

## 2020-12-19 DIAGNOSIS — I2510 Atherosclerotic heart disease of native coronary artery without angina pectoris: Secondary | ICD-10-CM | POA: Diagnosis not present

## 2020-12-19 DIAGNOSIS — K219 Gastro-esophageal reflux disease without esophagitis: Secondary | ICD-10-CM | POA: Diagnosis not present

## 2020-12-19 DIAGNOSIS — I110 Hypertensive heart disease with heart failure: Secondary | ICD-10-CM | POA: Diagnosis not present

## 2020-12-23 DIAGNOSIS — H3589 Other specified retinal disorders: Secondary | ICD-10-CM | POA: Diagnosis not present

## 2020-12-23 DIAGNOSIS — H35373 Puckering of macula, bilateral: Secondary | ICD-10-CM | POA: Diagnosis not present

## 2020-12-23 DIAGNOSIS — H35353 Cystoid macular degeneration, bilateral: Secondary | ICD-10-CM | POA: Diagnosis not present

## 2020-12-23 DIAGNOSIS — Z961 Presence of intraocular lens: Secondary | ICD-10-CM | POA: Diagnosis not present

## 2020-12-23 DIAGNOSIS — H43813 Vitreous degeneration, bilateral: Secondary | ICD-10-CM | POA: Diagnosis not present

## 2020-12-23 DIAGNOSIS — H3023 Posterior cyclitis, bilateral: Secondary | ICD-10-CM | POA: Diagnosis not present

## 2020-12-23 DIAGNOSIS — E119 Type 2 diabetes mellitus without complications: Secondary | ICD-10-CM | POA: Diagnosis not present

## 2020-12-24 DIAGNOSIS — E7800 Pure hypercholesterolemia, unspecified: Secondary | ICD-10-CM | POA: Diagnosis not present

## 2020-12-24 DIAGNOSIS — I2510 Atherosclerotic heart disease of native coronary artery without angina pectoris: Secondary | ICD-10-CM | POA: Diagnosis not present

## 2020-12-24 DIAGNOSIS — H548 Legal blindness, as defined in USA: Secondary | ICD-10-CM | POA: Diagnosis not present

## 2020-12-24 DIAGNOSIS — I081 Rheumatic disorders of both mitral and tricuspid valves: Secondary | ICD-10-CM | POA: Diagnosis not present

## 2020-12-24 DIAGNOSIS — I110 Hypertensive heart disease with heart failure: Secondary | ICD-10-CM | POA: Diagnosis not present

## 2020-12-24 DIAGNOSIS — K219 Gastro-esophageal reflux disease without esophagitis: Secondary | ICD-10-CM | POA: Diagnosis not present

## 2020-12-24 DIAGNOSIS — I700 Atherosclerosis of aorta: Secondary | ICD-10-CM | POA: Diagnosis not present

## 2020-12-24 DIAGNOSIS — J449 Chronic obstructive pulmonary disease, unspecified: Secondary | ICD-10-CM | POA: Diagnosis not present

## 2020-12-24 DIAGNOSIS — I5032 Chronic diastolic (congestive) heart failure: Secondary | ICD-10-CM | POA: Diagnosis not present

## 2020-12-25 DIAGNOSIS — I2510 Atherosclerotic heart disease of native coronary artery without angina pectoris: Secondary | ICD-10-CM | POA: Diagnosis not present

## 2020-12-25 DIAGNOSIS — E7800 Pure hypercholesterolemia, unspecified: Secondary | ICD-10-CM | POA: Diagnosis not present

## 2020-12-25 DIAGNOSIS — I110 Hypertensive heart disease with heart failure: Secondary | ICD-10-CM | POA: Diagnosis not present

## 2020-12-25 DIAGNOSIS — I081 Rheumatic disorders of both mitral and tricuspid valves: Secondary | ICD-10-CM | POA: Diagnosis not present

## 2020-12-25 DIAGNOSIS — K219 Gastro-esophageal reflux disease without esophagitis: Secondary | ICD-10-CM | POA: Diagnosis not present

## 2020-12-25 DIAGNOSIS — H548 Legal blindness, as defined in USA: Secondary | ICD-10-CM | POA: Diagnosis not present

## 2020-12-25 DIAGNOSIS — J449 Chronic obstructive pulmonary disease, unspecified: Secondary | ICD-10-CM | POA: Diagnosis not present

## 2020-12-25 DIAGNOSIS — I700 Atherosclerosis of aorta: Secondary | ICD-10-CM | POA: Diagnosis not present

## 2020-12-25 DIAGNOSIS — I5032 Chronic diastolic (congestive) heart failure: Secondary | ICD-10-CM | POA: Diagnosis not present

## 2021-01-12 DIAGNOSIS — M25561 Pain in right knee: Secondary | ICD-10-CM | POA: Diagnosis not present

## 2021-01-12 DIAGNOSIS — M1712 Unilateral primary osteoarthritis, left knee: Secondary | ICD-10-CM | POA: Diagnosis not present

## 2021-01-23 ENCOUNTER — Ambulatory Visit: Admitting: Neurology

## 2021-01-29 DIAGNOSIS — D500 Iron deficiency anemia secondary to blood loss (chronic): Secondary | ICD-10-CM | POA: Diagnosis not present

## 2021-01-29 DIAGNOSIS — R110 Nausea: Secondary | ICD-10-CM | POA: Diagnosis not present

## 2021-01-29 DIAGNOSIS — K582 Mixed irritable bowel syndrome: Secondary | ICD-10-CM | POA: Diagnosis not present

## 2021-02-03 DIAGNOSIS — R3589 Other polyuria: Secondary | ICD-10-CM | POA: Diagnosis not present

## 2021-02-03 DIAGNOSIS — R079 Chest pain, unspecified: Secondary | ICD-10-CM | POA: Diagnosis not present

## 2021-02-03 DIAGNOSIS — Z79899 Other long term (current) drug therapy: Secondary | ICD-10-CM | POA: Diagnosis not present

## 2021-02-03 DIAGNOSIS — I700 Atherosclerosis of aorta: Secondary | ICD-10-CM | POA: Diagnosis not present

## 2021-02-03 DIAGNOSIS — Z6827 Body mass index (BMI) 27.0-27.9, adult: Secondary | ICD-10-CM | POA: Diagnosis not present

## 2021-02-03 DIAGNOSIS — J449 Chronic obstructive pulmonary disease, unspecified: Secondary | ICD-10-CM | POA: Diagnosis not present

## 2021-02-06 ENCOUNTER — Ambulatory Visit: Payer: Medicare HMO | Admitting: Sports Medicine

## 2021-02-20 ENCOUNTER — Other Ambulatory Visit: Payer: Self-pay

## 2021-02-20 ENCOUNTER — Encounter: Payer: Self-pay | Admitting: Sports Medicine

## 2021-02-20 ENCOUNTER — Ambulatory Visit (INDEPENDENT_AMBULATORY_CARE_PROVIDER_SITE_OTHER): Payer: Medicare HMO | Admitting: Sports Medicine

## 2021-02-20 DIAGNOSIS — L600 Ingrowing nail: Secondary | ICD-10-CM

## 2021-02-20 DIAGNOSIS — L608 Other nail disorders: Secondary | ICD-10-CM | POA: Diagnosis not present

## 2021-02-20 DIAGNOSIS — E1169 Type 2 diabetes mellitus with other specified complication: Secondary | ICD-10-CM | POA: Diagnosis not present

## 2021-02-20 DIAGNOSIS — E1151 Type 2 diabetes mellitus with diabetic peripheral angiopathy without gangrene: Secondary | ICD-10-CM

## 2021-02-20 DIAGNOSIS — M79674 Pain in right toe(s): Secondary | ICD-10-CM | POA: Diagnosis not present

## 2021-02-20 DIAGNOSIS — B351 Tinea unguium: Secondary | ICD-10-CM | POA: Diagnosis not present

## 2021-02-20 NOTE — Progress Notes (Signed)
Subjective: Heather Mccoy is a 75 y.o. female patient presents to office today complaining of a moderately painful  right great toenail. States that her left one is doing good but now wants the right one removed.  Patient Active Problem List   Diagnosis Date Noted   Polypharmacy    Epigastric pain    Nausea and vomiting    Orthostatic dizziness 03/03/2020   Aortic valve sclerosis    Unsteady gait    Hypotension    Anxiety    Arthritis    Cancer (HCC)    COPD (chronic obstructive pulmonary disease) (HCC)    Dysrhythmia    Hyperlipidemia    Mood disorder (HCC)    Orthostatic hypotension    Shortness of breath    Dizziness 09/18/2019   Bell's palsy 07/31/2019   Mixed hyperlipidemia 06/11/2019   Essential hypertension 06/11/2019   Coronary artery disease of native artery of native heart with stable angina pectoris ([place]) 06/11/2019   Paroxysmal atrial tachycardia ([place]) 06/11/2019   Hypertension 01/19/2019   Hyperlipemia 01/19/2019   Type II diabetes mellitus ([place]) 01/19/2019   Vitamin B12 deficiency 09/13/2014   Normocytic anemia 09/12/2014   Weight loss 09/12/2014   Chronic diarrhea of unknown origin 01/12/2012    Current Outpatient Medications on File Prior to Visit  Medication Sig Dispense Refill   alendronate (FOSAMAX) 70 MG tablet Take 70 mg by mouth every Sunday. Take with a full glass of water on an empty stomach.     aspirin EC 81 MG tablet Take 81 mg by mouth daily.     atorvastatin (LIPITOR) 40 MG tablet Take 40 mg by mouth daily.     carvedilol (COREG) 12.5 MG tablet Take 12.5 mg by mouth 2 (two) times daily with a meal.     citalopram (CELEXA) 10 MG tablet Take 10 mg by mouth daily.     diazepam (VALIUM) 5 MG tablet Take 5 mg by mouth 2 (two) times daily.     dicyclomine (BENTYL) 20 MG tablet Take 20 mg by mouth 3 (three) times daily.     diphenoxylate-atropine (LOMOTIL) 2.5-0.025 MG tablet Take 2 tablets by mouth 4 (four) times daily.     famotidine (PEPCID)  40 MG tablet Take 40 mg by mouth 2 (two) times daily.     FEROSUL 325 (65 Fe) MG tablet Take 325 mg by mouth daily.     flecainide (TAMBOCOR) 50 MG tablet Take 1 tablet (50 mg total) by mouth every 12 (twelve) hours. 30 tablet 0   furosemide (LASIX) 20 MG tablet Take 20 mg by mouth daily as needed for edema.     hydrALAZINE (APRESOLINE) 25 MG tablet Take 25 mg by mouth 2 (two) times daily.     lidocaine (LIDODERM) 5 % Place 1 patch onto the skin daily. Remove & Discard patch within 12 hours or as directed by MD 30 patch 0   loperamide (IMODIUM) 2 MG capsule Take 2 mg by mouth every 6 (six) hours as needed for diarrhea or loose stools.     meloxicam (MOBIC) 7.5 MG tablet Take 7.5 mg by mouth 2 (two) times daily.     methocarbamol (ROBAXIN) 750 MG tablet Take 750 mg by mouth 2 (two) times daily.     midodrine (PROAMATINE) 5 MG tablet Take 5 mg by mouth 3 (three) times daily with meals.     montelukast (SINGULAIR) 10 MG tablet TAKE 1 TABLET BY MOUTH AT BEDTIME 90 tablet 3   neomycin-polymyxin-hydrocortisone (CORTISPORIN)  OTIC solution Apply 2-3 drops to the ingrown toenail site twice daily. Cover with band-aid. 10 mL 0   ondansetron (ZOFRAN) 4 MG tablet Take 4 mg by mouth 2 (two) times daily as needed for nausea.     pantoprazole (PROTONIX) 40 MG tablet Take 40 mg by mouth daily.     potassium chloride SA (KLOR-CON) 20 MEQ tablet Take 20 mEq by mouth daily.     ranolazine (RANEXA) 500 MG 12 hr tablet Take 1,000 mg by mouth 2 (two) times daily.     No current facility-administered medications on file prior to visit.    Allergies  Allergen Reactions   Codeine Hives   Morphine Dermatitis   Nitroglycerin Nausea And Vomiting   Tylenol [Acetaminophen]    Oxycodone Rash    Can take with benadryl.    Objective:  There were no vitals filed for this visit.  General: Well developed, nourished, in no acute distress, alert and oriented x3   Dermatology: Skin is warm, dry and supple bilateral.   Right hallux nail appears to be severely incurvated with hyperkeratosis formation at the distal aspects of the medial and lateral nail border with significant pincer deformity. (+) Erythema. (+) Edema. (-) serosanguous drainage present. The left hallux nail bed is healed. Remaining nails appear unremarkable at this time. There are no open sores, lesions or other signs of infection  present.  Vascular: Dorsalis Pedis artery 2/4 and Posterior Tibial artery pedal pulses are 1/4 bilateral with capillary fill time 3 seconds.  Scant pedal hair growth present.  Varicosities bilateral.  No lower extremity edema.   Neruologic: Grossly intact via light touch bilateral.  Musculoskeletal: Tenderness to palpation of the right hallux nail and nail folds.  Muscular strength within normal limits in all groups bilateral.   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 Patient elects for permanent removal of right hallux nail completely. - After a verbal and written consent, injected 3 ml of a 50:50 mixture of 2% plain  lidocaine and 0.5% plain marcaine in a normal hallux block fashion. Next, a  betadine prep was performed. Anesthesia was tested and found to be appropriate.  The offending right hallux nail completely was then incised from the hyponychium to the epinychium. The offending nail border was removed and cleared from the field. The area was curretted for any remaining nail or spicules. Phenol application performed and the area was then flushed with alcohol and dressed with antibiotic cream and a dry sterile dressing. -Patient was instructed  to leave the dressing intact for today and begin soaking  in a weak solution of betadine or Epsom salt and water tomorrow. Patient was instructed to  soak for 15-20 minutes each day and apply neosporin/corticosporin and a gauze or bandaid dressing each day. -Patient was instructed to monitor the toe for signs of infection and return to office if toe becomes red, hot or swollen. -Advised ice, elevation, and Aleve if needed for pain.  -Patient is to return in 3-4 weeks for follow up care/nail check.  Landis Martins, DPM

## 2021-02-24 IMAGING — MR MR HEAD W/O CM
12 of 13 series · 44 of 48 positions shown · non-contrast
Comparison: Head CT earlier tonight. Brain MRI Juan Angel Sakamoto
02/01/2020 and earlier.

CLINICAL DATA: 74-year-old female with dizziness, headache,
neurologic deficit.

EXAM:
MRI HEAD WITHOUT CONTRAST
TECHNIQUE: Multiplanar, multiecho pulse sequences of the brain and surrounding
structures were obtained without intravenous contrast.

[Series 5: DWI · axial · 3.0mm · 0.88mm/px · z∈[-81,+62]mm · 8 of 100 slices shown (1 of 4)]
[im 1/100]
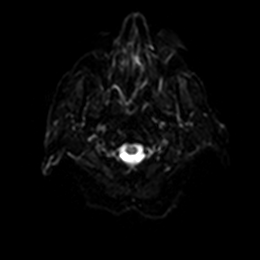
[im 15/100]
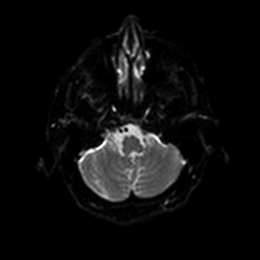
[im 29/100]
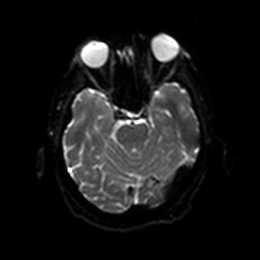
[im 43/100]
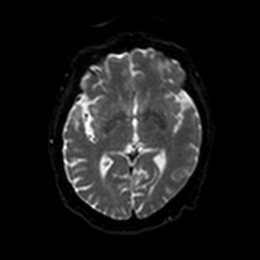
[im 57/100]
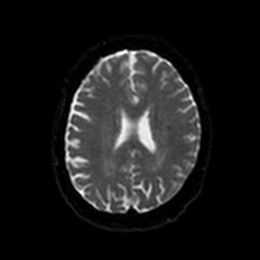
[im 71/100]
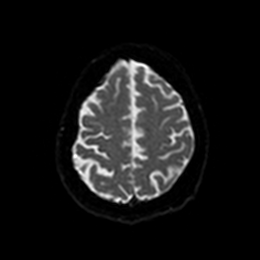
[im 85/100]
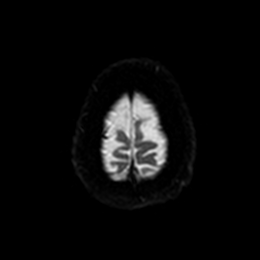
[im 100/100]
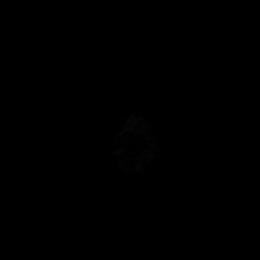

[Series 6: DWI · axial · 3.0mm · 0.88mm/px · z∈[-81,+62]mm · 4 of 50 slices shown (2 of 4)]
[im 1/50]
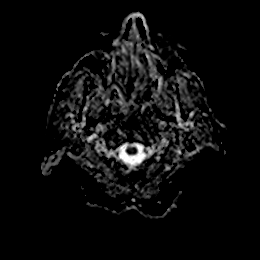
[im 17/50]
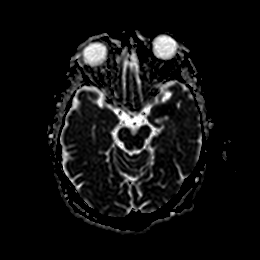
[im 33/50]
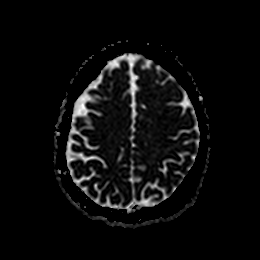
[im 50/50]
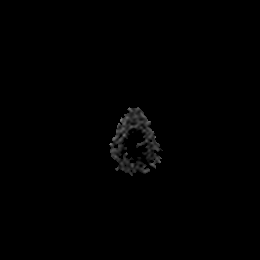

[Series 7: DWI · coronal · 4.0mm · 0.88mm/px · 5 of 66 slices shown (3 of 4)]
[im 1/66]
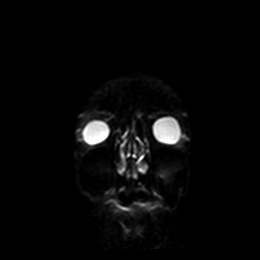
[im 17/66]
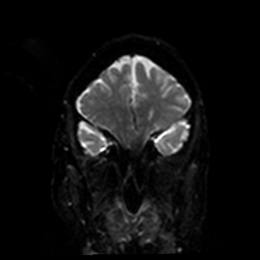
[im 33/66]
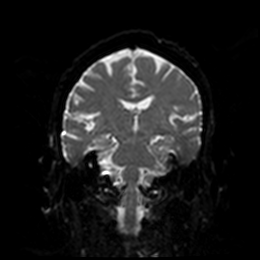
[im 49/66]
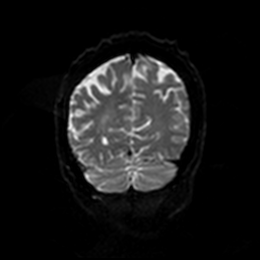
[im 66/66]
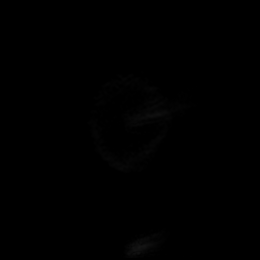

[Series 8: DWI · coronal · 4.0mm · 0.88mm/px · 3 of 33 slices shown (4 of 4)]
[im 1/33]
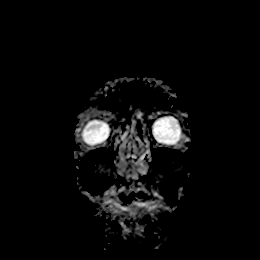
[im 17/33]
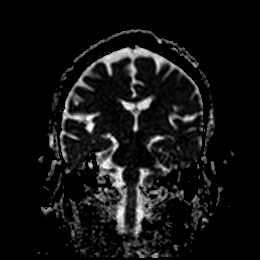
[im 33/33]
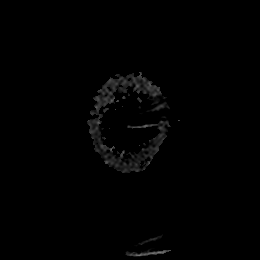

[Series 9: T1 · sagittal · 5.0mm · 0.72mm/px · 2 of 25 slices shown]
[im 1/25]
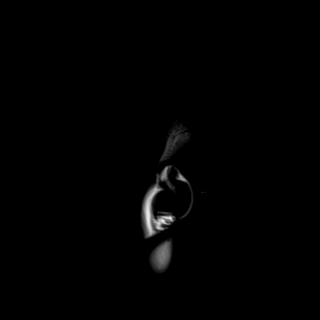
[im 25/25]
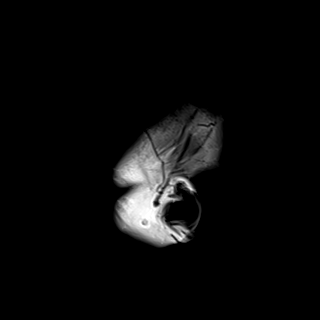

[Series 10: T2 · axial · 5.0mm · 0.72mm/px · z∈[-81,+59]mm · 2 of 25 slices shown (1 of 2)]
[im 1/25]
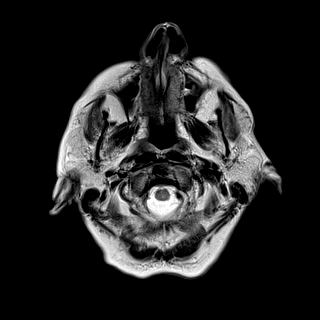
[im 25/25]
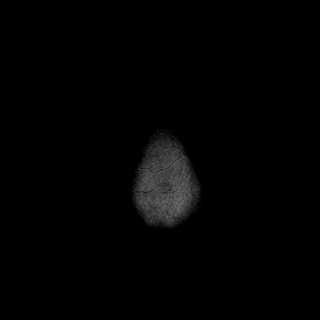

[Series 11: FLAIR · axial · 5.0mm · 0.90mm/px · z∈[-81,+59]mm · 2 of 25 slices shown]
[im 1/25]
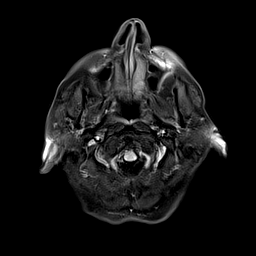
[im 25/25]
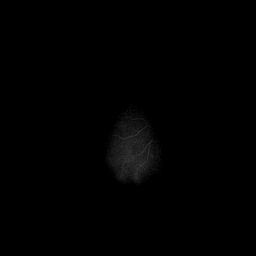

[Series 12: mag_images · axial · 3.0mm · 0.90mm/px · z∈[-87,+61]mm · 4 of 52 slices shown]
[im 1/52]
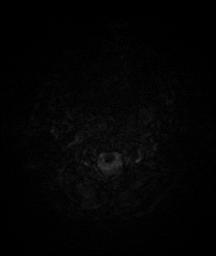
[im 18/52]
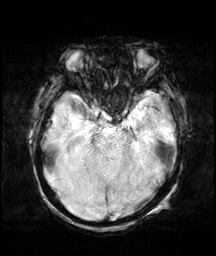
[im 35/52]
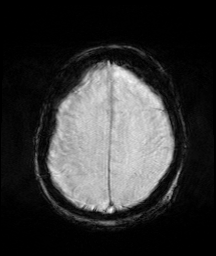
[im 52/52]
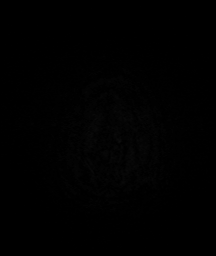

[Series 13: pha_images · axial · 3.0mm · 0.90mm/px · z∈[-87,+61]mm · 4 of 52 slices shown]
[im 1/52]
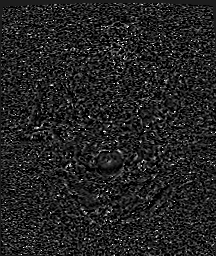
[im 18/52]
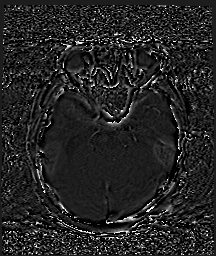
[im 35/52]
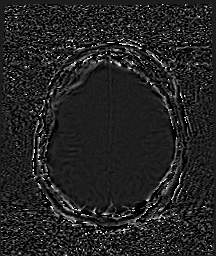
[im 52/52]
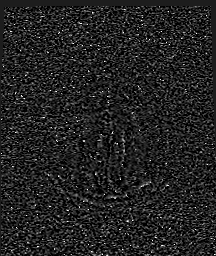

[Series 14: swi_images · axial · 3.0mm · 0.90mm/px · z∈[-87,+61]mm · 4 of 52 slices shown]
[im 1/52]
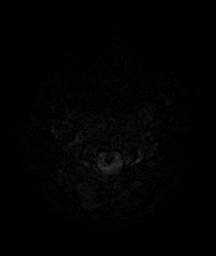
[im 18/52]
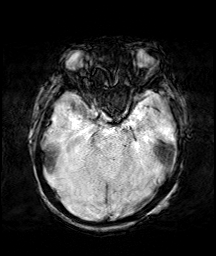
[im 35/52]
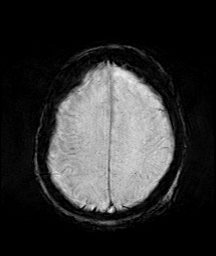
[im 52/52]
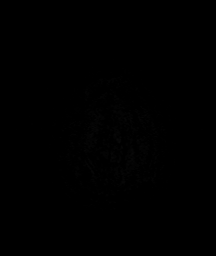

[Series 15: mip_images(sw) · axial · 24.0mm · 0.90mm/px · z∈[-77,+51]mm · 4 of 45 slices shown]
[im 1/45]
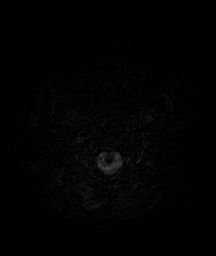
[im 15/45]
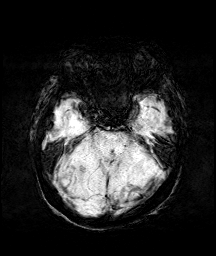
[im 30/45]
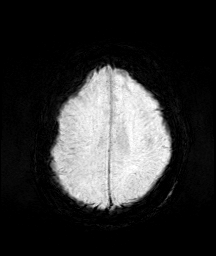
[im 45/45]
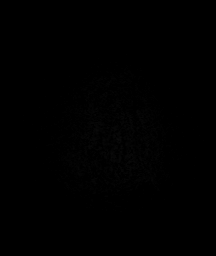

[Series 17: T2 · coronal · 5.0mm · 0.34mm/px · 2 of 29 slices shown (2 of 2)]
[im 1/29]
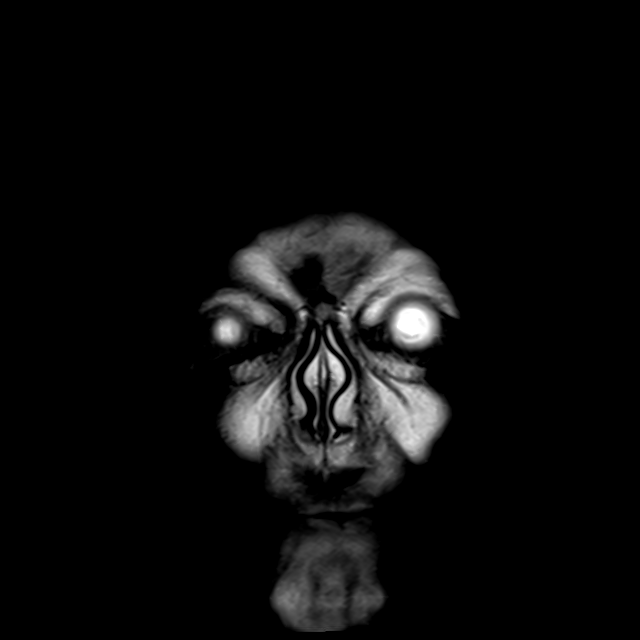
[im 29/29]
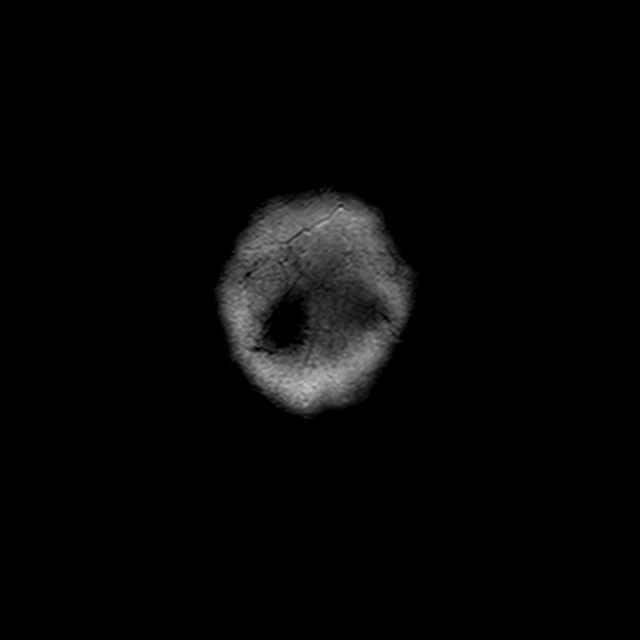

[44 of 48 positions shown; findings below may reference images not displayed]

FINDINGS: Brain: Stable cerebral volume on MRIs this year. No restricted
diffusion to suggest acute infarction. No midline shift, mass
effect, evidence of mass lesion, ventriculomegaly, extra-axial
collection or acute intracranial hemorrhage. Cervicomedullary
junction and pituitary are within normal limits.

Mild for age periatrial and occasionally other scattered white
matter T2 and FLAIR hyperintensity appears stable since [REDACTED]. No
superimposed cortical encephalomalacia, chronic cerebral blood
products, or signal changes in the deep gray nuclei, brainstem, or
cerebellum.

Vascular: Major intracranial vascular flow voids are stable.

Skull and upper cervical spine: Negative for age visible cervical
spine. Bone marrow signal is stable and within normal limits.

Sinuses/Orbits: Stable postoperative changes to the orbits. Mild
paranasal sinus mucosal thickening is stable.

Other: Mastoids remain well pneumatized. Grossly normal visible
internal auditory structures, with no dedicated internal auditory
imaging across this series of exams. Scalp and face soft tissues
appear negative.
IMPRESSION: 1.  No acute intracranial abnormality.

2. Stable MRI appearance of the brain this year with mild for age
nonspecific white matter signal changes.
In light of persistent dizziness, routine follow-up Head MRI without
and with contrast utilizing dedicated internal auditory canal (IAC)
protocol might be valuable.

## 2021-03-12 ENCOUNTER — Telehealth: Payer: Self-pay | Admitting: Sports Medicine

## 2021-03-12 ENCOUNTER — Other Ambulatory Visit: Payer: Self-pay | Admitting: Sports Medicine

## 2021-03-12 MED ORDER — AMOXICILLIN-POT CLAVULANATE 875-125 MG PO TABS: 1.0000 | ORAL_TABLET | Freq: Two times a day (BID) | ORAL | 0 refills | Status: DC

## 2021-03-12 NOTE — Progress Notes (Signed)
Sent Augmentin for concern for infection around ingrown

## 2021-03-12 NOTE — Telephone Encounter (Signed)
TFAC r/s appt from tomorrow to Tues.  States nail is draining yellow pus and wasn't sure if she needed antibiotic  [place]

## 2021-03-13 ENCOUNTER — Ambulatory Visit: Payer: Medicare HMO | Admitting: Sports Medicine

## 2021-03-13 NOTE — Telephone Encounter (Signed)
Pt notified/reb °

## 2021-03-17 ENCOUNTER — Ambulatory Visit (INDEPENDENT_AMBULATORY_CARE_PROVIDER_SITE_OTHER): Payer: Medicare HMO | Admitting: Sports Medicine

## 2021-03-17 ENCOUNTER — Other Ambulatory Visit: Payer: Self-pay

## 2021-03-17 DIAGNOSIS — M79674 Pain in right toe(s): Secondary | ICD-10-CM

## 2021-03-17 DIAGNOSIS — Z9889 Other specified postprocedural states: Secondary | ICD-10-CM

## 2021-03-17 NOTE — Progress Notes (Addendum)
Subjective: Heather Mccoy is a 75 y.o. female patient returns to office today for follow up evaluation after having right hallux total permanent nail avulsion performed on (02/20/2021). Patient has been soaking using epsom salt and applying topical antibiotic covered with bandaid daily. States that it is sore draining and red pain sometimes at worst 7 out of 10 with redness and swelling.  Patient deniesfever/chills/nausea/vomitting/any other related constitutional symptoms at this time.  Patient Active Problem List   Diagnosis Date Noted   Polypharmacy    Epigastric pain    Nausea and vomiting    Orthostatic dizziness 03/03/2020   Aortic valve sclerosis    Unsteady gait    Hypotension    Anxiety    Arthritis    Cancer (HCC)    COPD (chronic obstructive pulmonary disease) (HCC)    Dysrhythmia    Hyperlipidemia    Mood disorder (HCC)    Orthostatic hypotension    Shortness of breath    Dizziness 09/18/2019   Bell's palsy 07/31/2019   Mixed hyperlipidemia 06/11/2019   Essential hypertension 06/11/2019   Coronary artery disease of native artery of native heart with stable angina pectoris ([place]) 06/11/2019   Paroxysmal atrial tachycardia ([place]) 06/11/2019   Hypertension 01/19/2019   Hyperlipemia 01/19/2019   Type II diabetes mellitus ([place]) 01/19/2019   Vitamin B12 deficiency 09/13/2014   Normocytic anemia 09/12/2014   Weight loss 09/12/2014   Chronic diarrhea of unknown origin 01/12/2012    Current Outpatient Medications on File Prior to Visit  Medication Sig Dispense Refill   alendronate (FOSAMAX) 70 MG tablet Take 70 mg by mouth every Sunday. Take with a full glass of water on an empty stomach.     amoxicillin-clavulanate (AUGMENTIN) 875-125 MG tablet Take 1 tablet by mouth 2 (two) times daily. 28 tablet 0   aspirin EC 81 MG tablet Take 81 mg by mouth daily.     atorvastatin (LIPITOR) 40 MG tablet Take 40 mg by mouth daily.     carvedilol (COREG) 12.5 MG tablet Take 12.5 mg by  mouth 2 (two) times daily with a meal.     citalopram (CELEXA) 10 MG tablet Take 10 mg by mouth daily.     diazepam (VALIUM) 5 MG tablet Take 5 mg by mouth 2 (two) times daily.     dicyclomine (BENTYL) 20 MG tablet Take 20 mg by mouth 3 (three) times daily.     diphenoxylate-atropine (LOMOTIL) 2.5-0.025 MG tablet Take 2 tablets by mouth 4 (four) times daily.     famotidine (PEPCID) 40 MG tablet Take 40 mg by mouth 2 (two) times daily.     FEROSUL 325 (65 Fe) MG tablet Take 325 mg by mouth daily.     flecainide (TAMBOCOR) 50 MG tablet Take 1 tablet (50 mg total) by mouth every 12 (twelve) hours. 30 tablet 0   furosemide (LASIX) 20 MG tablet Take 20 mg by mouth daily as needed for edema.     hydrALAZINE (APRESOLINE) 25 MG tablet Take 25 mg by mouth 2 (two) times daily.     lidocaine (LIDODERM) 5 % Place 1 patch onto the skin daily. Remove & Discard patch within 12 hours or as directed by MD 30 patch 0   loperamide (IMODIUM) 2 MG capsule Take 2 mg by mouth every 6 (six) hours as needed for diarrhea or loose stools.     meloxicam (MOBIC) 7.5 MG tablet Take 7.5 mg by mouth 2 (two) times daily.     methocarbamol (ROBAXIN)  750 MG tablet Take 750 mg by mouth 2 (two) times daily.     midodrine (PROAMATINE) 5 MG tablet Take 5 mg by mouth 3 (three) times daily with meals.     montelukast (SINGULAIR) 10 MG tablet TAKE 1 TABLET BY MOUTH AT BEDTIME 90 tablet 3   neomycin-polymyxin-hydrocortisone (CORTISPORIN) OTIC solution Apply 2-3 drops to the ingrown toenail site twice daily. Cover with band-aid. 10 mL 0   ondansetron (ZOFRAN) 4 MG tablet Take 4 mg by mouth 2 (two) times daily as needed for nausea.     pantoprazole (PROTONIX) 40 MG tablet Take 40 mg by mouth daily.     potassium chloride SA (KLOR-CON) 20 MEQ tablet Take 20 mEq by mouth daily.     ranolazine (RANEXA) 500 MG 12 hr tablet Take 1,000 mg by mouth 2 (two) times daily.     No current facility-administered medications on file prior to visit.     Allergies  Allergen Reactions   Codeine Hives   Morphine Dermatitis   Nitroglycerin Nausea And Vomiting   Tylenol [Acetaminophen]    Oxycodone Rash    Can take with benadryl.    Objective:  General: Well developed, nourished, in no acute distress, alert and oriented x3   Dermatology: Skin is warm, dry and supple bilateral.  Right hallux nail bed appears to be clean, dry, with mild granular tissue and moderate scabbing (+) Erythema at proximal nail fold. (-) Edema. (-) serosanguous drainage present. The remaining nails appear unremarkable at this time. There are no other lesions or other signs of infection  present.  Neurovascular status: Intact. No lower extremity swelling; No pain with calf compression bilateral.  Musculoskeletal: + Pain to right first toe.  Muscular strength within normal limits bilateral.   Assesement and Plan: Problem List Items Addressed This Visit   None Visit Diagnoses     S/P nail surgery    -  Primary   Toe pain, right           -Examined patient  -Cleansed right hallux nail fold and gently scrubbed with peroxide and q-tip/curetted away eschar at site and applied antibiotic cream covered with bandaid.  -Discussed plan of care with patient. -Advised patient to pick up antibiotics that I previously sent -Advised patient to continue with soaking and antibiotic cream and Band-Aid as directed -Patient was instructed to monitor the toe for reoccurrence and signs of infection; Patient advised to return to office or go to ER if toe becomes more red, hot or swollen. -Patient is to return in 3 to 4 weeks for another toe check to make sure the area is improving  Landis Martins, DPM

## 2021-03-18 DIAGNOSIS — T8484XS Pain due to internal orthopedic prosthetic devices, implants and grafts, sequela: Secondary | ICD-10-CM | POA: Diagnosis not present

## 2021-03-18 DIAGNOSIS — Z96651 Presence of right artificial knee joint: Secondary | ICD-10-CM | POA: Diagnosis not present

## 2021-03-18 DIAGNOSIS — M1712 Unilateral primary osteoarthritis, left knee: Secondary | ICD-10-CM | POA: Diagnosis not present

## 2021-04-08 ENCOUNTER — Ambulatory Visit (INDEPENDENT_AMBULATORY_CARE_PROVIDER_SITE_OTHER): Payer: Medicare HMO | Admitting: Sports Medicine

## 2021-04-08 DIAGNOSIS — M79674 Pain in right toe(s): Secondary | ICD-10-CM | POA: Diagnosis not present

## 2021-04-08 DIAGNOSIS — Z9889 Other specified postprocedural states: Secondary | ICD-10-CM

## 2021-04-08 NOTE — Progress Notes (Signed)
Subjective: Heather Mccoy is a 75 y.o. female patient returns to office today for follow up evaluation after having right hallux total permanent nail avulsion performed on (02/20/2021). Patient states that her toe has been red and sore. Very little drainage.  No other symptoms.  Patient Active Problem List   Diagnosis Date Noted   Polypharmacy    Epigastric pain    Nausea and vomiting    Orthostatic dizziness 03/03/2020   Aortic valve sclerosis    Unsteady gait    Hypotension    Anxiety    Arthritis    Cancer (HCC)    COPD (chronic obstructive pulmonary disease) (HCC)    Dysrhythmia    Hyperlipidemia    Mood disorder (HCC)    Orthostatic hypotension    Shortness of breath    Dizziness 09/18/2019   Bell's palsy 07/31/2019   Mixed hyperlipidemia 06/11/2019   Essential hypertension 06/11/2019   Coronary artery disease of native artery of native heart with stable angina pectoris ([place]) 06/11/2019   Paroxysmal atrial tachycardia ([place]) 06/11/2019   Hypertension 01/19/2019   Hyperlipemia 01/19/2019   Type II diabetes mellitus ([place]) 01/19/2019   Vitamin B12 deficiency 09/13/2014   Normocytic anemia 09/12/2014   Weight loss 09/12/2014   Chronic diarrhea of unknown origin 01/12/2012    Current Outpatient Medications on File Prior to Visit  Medication Sig Dispense Refill   alendronate (FOSAMAX) 70 MG tablet Take 70 mg by mouth every Sunday. Take with a full glass of water on an empty stomach.     amoxicillin-clavulanate (AUGMENTIN) 875-125 MG tablet Take 1 tablet by mouth 2 (two) times daily. 28 tablet 0   aspirin EC 81 MG tablet Take 81 mg by mouth daily.     atorvastatin (LIPITOR) 40 MG tablet Take 40 mg by mouth daily.     carvedilol (COREG) 12.5 MG tablet Take 12.5 mg by mouth 2 (two) times daily with a meal.     citalopram (CELEXA) 10 MG tablet Take 10 mg by mouth daily.     diazepam (VALIUM) 5 MG tablet Take 5 mg by mouth 2 (two) times daily.     dicyclomine (BENTYL) 20 MG  tablet Take 20 mg by mouth 3 (three) times daily.     diphenoxylate-atropine (LOMOTIL) 2.5-0.025 MG tablet Take 2 tablets by mouth 4 (four) times daily.     famotidine (PEPCID) 40 MG tablet Take 40 mg by mouth 2 (two) times daily.     FEROSUL 325 (65 Fe) MG tablet Take 325 mg by mouth daily.     flecainide (TAMBOCOR) 50 MG tablet Take 1 tablet (50 mg total) by mouth every 12 (twelve) hours. 30 tablet 0   furosemide (LASIX) 20 MG tablet Take 20 mg by mouth daily as needed for edema.     hydrALAZINE (APRESOLINE) 25 MG tablet Take 25 mg by mouth 2 (two) times daily.     lidocaine (LIDODERM) 5 % Place 1 patch onto the skin daily. Remove & Discard patch within 12 hours or as directed by MD 30 patch 0   loperamide (IMODIUM) 2 MG capsule Take 2 mg by mouth every 6 (six) hours as needed for diarrhea or loose stools.     meloxicam (MOBIC) 7.5 MG tablet Take 7.5 mg by mouth 2 (two) times daily.     methocarbamol (ROBAXIN) 750 MG tablet Take 750 mg by mouth 2 (two) times daily.     midodrine (PROAMATINE) 5 MG tablet Take 5 mg by mouth 3 (three)  times daily with meals.     montelukast (SINGULAIR) 10 MG tablet TAKE 1 TABLET BY MOUTH AT BEDTIME 90 tablet 3   neomycin-polymyxin-hydrocortisone (CORTISPORIN) OTIC solution Apply 2-3 drops to the ingrown toenail site twice daily. Cover with band-aid. 10 mL 0   ondansetron (ZOFRAN) 4 MG tablet Take 4 mg by mouth 2 (two) times daily as needed for nausea.     pantoprazole (PROTONIX) 40 MG tablet Take 40 mg by mouth daily.     potassium chloride SA (KLOR-CON) 20 MEQ tablet Take 20 mEq by mouth daily.     ranolazine (RANEXA) 500 MG 12 hr tablet Take 1,000 mg by mouth 2 (two) times daily.     No current facility-administered medications on file prior to visit.    Allergies  Allergen Reactions   Codeine Hives   Morphine Dermatitis   Nitroglycerin Nausea And Vomiting   Tylenol [Acetaminophen]    Oxycodone Rash    Can take with benadryl.    Objective:  General:  Well developed, nourished, in no acute distress, alert and oriented x3   Dermatology: Skin is warm, dry and supple bilateral.  Right hallux nail bed appears to be clean, dry, with scab (+) minimal blanchable erythema (-) Edema. (-) serosanguous drainage present. The remaining nails appear unremarkable at this time. There are no other lesions or other signs of infection  present.  Neurovascular status: Intact. No lower extremity swelling; No pain with calf compression bilateral.  Musculoskeletal: +Mild pain to right first toe.  Muscular strength within normal limits bilateral.   Assesement and Plan: Problem List Items Addressed This Visit   None Visit Diagnoses     S/P nail surgery    -  Primary   Toe pain, right           -Examined patient  -Right 1st toe healing well; may d/c bandaids and soakin -Advised patient to avoid shoe that can rub the toe and to rest and elevate if painful -At no charge debrided all lesser toenail using sterile nail nipper without incident -Patient is to return PRN   Landis Martins, DPM

## 2021-04-17 ENCOUNTER — Ambulatory Visit: Payer: Medicare HMO | Admitting: Neurology

## 2021-05-21 DIAGNOSIS — S20212A Contusion of left front wall of thorax, initial encounter: Secondary | ICD-10-CM | POA: Diagnosis not present

## 2021-05-21 DIAGNOSIS — R1012 Left upper quadrant pain: Secondary | ICD-10-CM | POA: Diagnosis not present

## 2021-05-21 DIAGNOSIS — W19XXXA Unspecified fall, initial encounter: Secondary | ICD-10-CM | POA: Diagnosis not present

## 2021-05-21 DIAGNOSIS — M25562 Pain in left knee: Secondary | ICD-10-CM | POA: Diagnosis not present

## 2021-05-21 DIAGNOSIS — R109 Unspecified abdominal pain: Secondary | ICD-10-CM | POA: Diagnosis not present

## 2021-05-22 DIAGNOSIS — M25562 Pain in left knee: Secondary | ICD-10-CM | POA: Diagnosis not present

## 2021-05-22 DIAGNOSIS — M1712 Unilateral primary osteoarthritis, left knee: Secondary | ICD-10-CM | POA: Diagnosis not present

## 2021-06-10 DIAGNOSIS — D509 Iron deficiency anemia, unspecified: Secondary | ICD-10-CM | POA: Diagnosis not present

## 2021-06-10 DIAGNOSIS — Z85038 Personal history of other malignant neoplasm of large intestine: Secondary | ICD-10-CM | POA: Diagnosis not present

## 2021-06-10 DIAGNOSIS — Z9181 History of falling: Secondary | ICD-10-CM | POA: Diagnosis not present

## 2021-06-10 DIAGNOSIS — Z1331 Encounter for screening for depression: Secondary | ICD-10-CM | POA: Diagnosis not present

## 2021-06-10 DIAGNOSIS — Z6826 Body mass index (BMI) 26.0-26.9, adult: Secondary | ICD-10-CM | POA: Diagnosis not present

## 2021-06-10 DIAGNOSIS — I499 Cardiac arrhythmia, unspecified: Secondary | ICD-10-CM | POA: Diagnosis not present

## 2021-06-10 DIAGNOSIS — R42 Dizziness and giddiness: Secondary | ICD-10-CM | POA: Diagnosis not present

## 2021-06-10 DIAGNOSIS — I10 Essential (primary) hypertension: Secondary | ICD-10-CM | POA: Diagnosis not present

## 2021-06-24 DIAGNOSIS — Z0000 Encounter for general adult medical examination without abnormal findings: Secondary | ICD-10-CM | POA: Diagnosis not present

## 2021-06-24 DIAGNOSIS — E278 Other specified disorders of adrenal gland: Secondary | ICD-10-CM | POA: Diagnosis not present

## 2021-06-24 DIAGNOSIS — H60501 Unspecified acute noninfective otitis externa, right ear: Secondary | ICD-10-CM | POA: Diagnosis not present

## 2021-06-24 DIAGNOSIS — N760 Acute vaginitis: Secondary | ICD-10-CM | POA: Diagnosis not present

## 2021-06-24 DIAGNOSIS — N3946 Mixed incontinence: Secondary | ICD-10-CM | POA: Diagnosis not present

## 2021-06-24 DIAGNOSIS — I5032 Chronic diastolic (congestive) heart failure: Secondary | ICD-10-CM | POA: Diagnosis not present

## 2021-06-24 DIAGNOSIS — M810 Age-related osteoporosis without current pathological fracture: Secondary | ICD-10-CM | POA: Diagnosis not present

## 2021-06-24 DIAGNOSIS — N3001 Acute cystitis with hematuria: Secondary | ICD-10-CM | POA: Diagnosis not present

## 2021-06-24 DIAGNOSIS — Z23 Encounter for immunization: Secondary | ICD-10-CM | POA: Diagnosis not present

## 2021-06-25 DIAGNOSIS — N3001 Acute cystitis with hematuria: Secondary | ICD-10-CM | POA: Diagnosis not present

## 2021-07-31 DIAGNOSIS — Z1231 Encounter for screening mammogram for malignant neoplasm of breast: Secondary | ICD-10-CM | POA: Diagnosis not present

## 2021-08-10 DIAGNOSIS — J40 Bronchitis, not specified as acute or chronic: Secondary | ICD-10-CM | POA: Diagnosis not present

## 2021-08-10 DIAGNOSIS — J309 Allergic rhinitis, unspecified: Secondary | ICD-10-CM | POA: Diagnosis not present

## 2021-08-10 DIAGNOSIS — K130 Diseases of lips: Secondary | ICD-10-CM | POA: Diagnosis not present

## 2021-08-10 DIAGNOSIS — H60501 Unspecified acute noninfective otitis externa, right ear: Secondary | ICD-10-CM | POA: Diagnosis not present

## 2021-08-10 DIAGNOSIS — H60549 Acute eczematoid otitis externa, unspecified ear: Secondary | ICD-10-CM | POA: Diagnosis not present

## 2021-08-10 DIAGNOSIS — Z6826 Body mass index (BMI) 26.0-26.9, adult: Secondary | ICD-10-CM | POA: Diagnosis not present

## 2021-08-10 DIAGNOSIS — J329 Chronic sinusitis, unspecified: Secondary | ICD-10-CM | POA: Diagnosis not present

## 2021-08-11 DIAGNOSIS — M7061 Trochanteric bursitis, right hip: Secondary | ICD-10-CM | POA: Diagnosis not present

## 2021-08-24 DIAGNOSIS — M1712 Unilateral primary osteoarthritis, left knee: Secondary | ICD-10-CM | POA: Diagnosis not present
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
## 2021-08-31 ENCOUNTER — Ambulatory Visit: Payer: Medicare HMO

## 2021-08-31 ENCOUNTER — Ambulatory Visit (INDEPENDENT_AMBULATORY_CARE_PROVIDER_SITE_OTHER): Payer: Medicare HMO | Admitting: Cardiology

## 2021-08-31 ENCOUNTER — Encounter: Payer: Self-pay | Admitting: Cardiology

## 2021-08-31 VITALS — BP 144/76 | HR 66 | Ht 62.0 in | Wt 147.0 lb

## 2021-08-31 DIAGNOSIS — E119 Type 2 diabetes mellitus without complications: Secondary | ICD-10-CM | POA: Diagnosis not present

## 2021-08-31 DIAGNOSIS — I10 Essential (primary) hypertension: Secondary | ICD-10-CM | POA: Diagnosis not present

## 2021-08-31 DIAGNOSIS — Z79899 Other long term (current) drug therapy: Secondary | ICD-10-CM

## 2021-08-31 DIAGNOSIS — I471 Supraventricular tachycardia: Secondary | ICD-10-CM

## 2021-08-31 DIAGNOSIS — E782 Mixed hyperlipidemia: Secondary | ICD-10-CM | POA: Diagnosis not present

## 2021-08-31 DIAGNOSIS — I25118 Atherosclerotic heart disease of native coronary artery with other forms of angina pectoris: Secondary | ICD-10-CM

## 2021-08-31 DIAGNOSIS — Z59819 Housing instability, housed unspecified: Secondary | ICD-10-CM | POA: Diagnosis not present

## 2021-08-31 DIAGNOSIS — I951 Orthostatic hypotension: Secondary | ICD-10-CM | POA: Diagnosis not present

## 2021-08-31 DIAGNOSIS — J449 Chronic obstructive pulmonary disease, unspecified: Secondary | ICD-10-CM | POA: Diagnosis not present

## 2021-08-31 NOTE — Patient Instructions (Addendum)
Medication Instructions: Your physician has recommended you make the following change in your medication:  ?Discontinue Hydralazine ? ?*If you need a refill on your cardiac medications before your next appointment, please call your pharmacy* ? ? ?Lab Work: ?Your physician recommends that you return for lab work in: Today for Troponin level, CBC and BMP ? ?If you have labs (blood work) drawn today and your tests are completely normal, you will receive your results only by: ?MyChart Message (if you have MyChart) OR ?A paper copy in the mail ?If you have any lab test that is abnormal or we need to change your treatment, we will call you to review the results. ? ? ?Testing/Procedures: ?Your physician has requested that you have a lexiscan myoview. For further information please visit [email or web address]. Please follow instruction sheet, as given. ? ? ?The test will take approximately 3 to 4 hours to complete; you may bring reading material.  If someone comes with you to your appointment, they will need to remain in the main lobby due to limited space in the testing area. How to prepare for your Myocardial Perfusion Test: ?Do not eat or drink 3 hours prior to your test, except you may have water. ?Do not consume products containing caffeine (regular or decaffeinated) 12 hours prior to your test. (ex: coffee, chocolate, sodas, tea). ?Do bring a list of your current medications with you.  If not listed below, you may take your medications as normal. ?Do wear comfortable clothes (no dresses or overalls) and walking shoes, tennis shoes preferred (No heels or open toe shoes are allowed). ?Do NOT wear cologne, perfume, aftershave, or lotions (deodorant is allowed). ?If these instructions are not followed, your test will have to be rescheduled. ? ? ?You have been given a Live Zio heart monitor to be worn for 14 days. When finished will mail monitor back to Company in envelope provided. Please call company with any questions  about the monitor. ?Follow-Up: ?At [facility], you and your health needs are our priority.  As part of our continuing mission to provide you with exceptional heart care, we have created designated Provider Care Teams.  These Care Teams include your primary Cardiologist (physician) and Advanced Practice Providers (APPs -  Physician Assistants and Nurse Practitioners) who all work together to provide you with the care you need, when you need it. ? ?We recommend signing up for the patient portal called "MyChart".  Sign up information is provided on this After Visit Summary.  MyChart is used to connect with patients for Virtual Visits (Telemedicine).  Patients are able to view lab/test results, encounter notes, upcoming appointments, etc.  Non-urgent messages can be sent to your provider as well.   ?To learn more about what you can do with MyChart, go to [email or web address].   ? ?Your next appointment:   ?6 week(s) ? ?The format for your next appointment:   ?In Person ? ?Provider:   ?Shirlee More, MD  ? ? ?Other Instructions ? ? ?Important Information About Sugar ? ? ? ? ?  ?

## 2021-09-01 ENCOUNTER — Telehealth (HOSPITAL_COMMUNITY): Payer: Self-pay | Admitting: *Deleted

## 2021-09-01 LAB — CBC WITH DIFFERENTIAL/PLATELET
Basophils Absolute: 0 10*3/uL (ref 0.0–0.2)
Basos: 0 %
EOS (ABSOLUTE): 0.4 10*3/uL (ref 0.0–0.4)
Eos: 2 %
Hematocrit: 39.7 % (ref 34.0–46.6)
Hemoglobin: 13.5 g/dL (ref 11.1–15.9)
Immature Grans (Abs): 0.1 10*3/uL (ref 0.0–0.1)
Immature Granulocytes: 1 %
Lymphocytes Absolute: 2.4 10*3/uL (ref 0.7–3.1)
Lymphs: 13 %
MCH: 32.5 pg (ref 26.6–33.0)
MCHC: 34 g/dL (ref 31.5–35.7)
MCV: 95 fL (ref 79–97)
Monocytes Absolute: 1.2 10*3/uL — ABNORMAL HIGH (ref 0.1–0.9)
Monocytes: 7 %
Neutrophils Absolute: 14.1 10*3/uL — ABNORMAL HIGH (ref 1.4–7.0)
Neutrophils: 77 %
Platelets: 211 10*3/uL (ref 150–450)
RBC: 4.16 x10E6/uL (ref 3.77–5.28)
RDW: 13.3 % (ref 11.7–15.4)
WBC: 18.2 10*3/uL — ABNORMAL HIGH (ref 3.4–10.8)

## 2021-09-01 LAB — BASIC METABOLIC PANEL
BUN/Creatinine Ratio: 28 (ref 12–28)
BUN: 31 mg/dL — ABNORMAL HIGH (ref 8–27)
CO2: 17 mmol/L — ABNORMAL LOW (ref 20–29)
Calcium: 9.1 mg/dL (ref 8.7–10.3)
Chloride: 108 mmol/L — ABNORMAL HIGH (ref 96–106)
Creatinine, Ser: 1.11 mg/dL — ABNORMAL HIGH (ref 0.57–1.00)
Glucose: 103 mg/dL — ABNORMAL HIGH (ref 70–99)
Potassium: 4.8 mmol/L (ref 3.5–5.2)
Sodium: 141 mmol/L (ref 134–144)
eGFR: 52 mL/min/{1.73_m2} — ABNORMAL LOW (ref >59)

## 2021-09-01 LAB — TROPONIN T: Troponin T (Highly Sensitive): 12 ng/L (ref 0–14)

## 2021-09-01 NOTE — Addendum Note (Signed)
Addended by: Jerl Santos R on: 09/01/2021 07:41 AM ? ? Modules accepted: Orders ? ?

## 2021-09-01 NOTE — Telephone Encounter (Signed)
Patient given detailed instructions per Myocardial Perfusion Study Information Sheet for the test on 09/03/21 Patient notified to arrive 15 minutes early and that it is imperative to arrive on time for appointment to keep from having the test rescheduled. ? If you need to cancel or reschedule your appointment, please call the office within 24 hours of your appointment. . Patient verbalized understanding.  Heather Mccoy ? ? ?

## 2021-09-02 DIAGNOSIS — H60549 Acute eczematoid otitis externa, unspecified ear: Secondary | ICD-10-CM | POA: Diagnosis not present

## 2021-09-02 DIAGNOSIS — Z6826 Body mass index (BMI) 26.0-26.9, adult: Secondary | ICD-10-CM | POA: Diagnosis not present

## 2021-09-02 DIAGNOSIS — J22 Unspecified acute lower respiratory infection: Secondary | ICD-10-CM | POA: Diagnosis not present

## 2021-09-02 DIAGNOSIS — I471 Supraventricular tachycardia: Secondary | ICD-10-CM | POA: Diagnosis not present

## 2021-09-02 DIAGNOSIS — D72829 Elevated white blood cell count, unspecified: Secondary | ICD-10-CM | POA: Diagnosis not present

## 2021-09-03 ENCOUNTER — Ambulatory Visit (INDEPENDENT_AMBULATORY_CARE_PROVIDER_SITE_OTHER): Payer: Medicare HMO

## 2021-09-03 ENCOUNTER — Telehealth (HOSPITAL_COMMUNITY): Payer: Self-pay | Admitting: Licensed Clinical Social Worker

## 2021-09-03 DIAGNOSIS — Z79899 Other long term (current) drug therapy: Secondary | ICD-10-CM | POA: Diagnosis not present

## 2021-09-03 DIAGNOSIS — I951 Orthostatic hypotension: Secondary | ICD-10-CM | POA: Diagnosis not present

## 2021-09-03 DIAGNOSIS — I25118 Atherosclerotic heart disease of native coronary artery with other forms of angina pectoris: Secondary | ICD-10-CM

## 2021-09-03 DIAGNOSIS — I471 Supraventricular tachycardia: Secondary | ICD-10-CM | POA: Diagnosis not present

## 2021-09-03 DIAGNOSIS — E782 Mixed hyperlipidemia: Secondary | ICD-10-CM

## 2021-09-03 DIAGNOSIS — I10 Essential (primary) hypertension: Secondary | ICD-10-CM | POA: Diagnosis not present

## 2021-09-03 DIAGNOSIS — Z59819 Housing instability, housed unspecified: Secondary | ICD-10-CM

## 2021-09-03 MED ORDER — TECHNETIUM TC 99M TETROFOSMIN IV KIT
30.5000 | PACK | Freq: Once | INTRAVENOUS | Status: AC | PRN
  Administered 2021-09-03: 30.5 via INTRAVENOUS

## 2021-09-03 MED ORDER — AMINOPHYLLINE 25 MG/ML IV SOLN
75.0000 mg | Freq: Once | INTRAVENOUS | Status: AC
  Administered 2021-09-03: 75 mg via INTRAVENOUS

## 2021-09-03 MED ORDER — REGADENOSON 0.4 MG/5ML IV SOLN
0.4000 mg | Freq: Once | INTRAVENOUS | Status: AC
  Administered 2021-09-03: 0.4 mg via INTRAVENOUS

## 2021-09-03 MED ORDER — TECHNETIUM TC 99M TETROFOSMIN IV KIT
10.9000 | PACK | Freq: Once | INTRAVENOUS | Status: AC | PRN
  Administered 2021-09-03: 10.9 via INTRAVENOUS

## 2021-09-03 MED ORDER — AMINOPHYLLINE 25 MG/ML IV SOLN: 75.0000 mg | Freq: Once | INTRAVENOUS | Status: DC
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
## 2021-09-04 LAB — MYOCARDIAL PERFUSION IMAGING
LV dias vol: 57 mL (ref 46–106)
LV sys vol: 16 mL
Nuc Stress EF: 72 %
Peak HR: 71 {beats}/min
Rest HR: 52 {beats}/min
Rest Nuclear Isotope Dose: 10.9 mCi
SDS: 1
SRS: 3
SSS: 4
ST Depression (mm): 0 mm
Stress Nuclear Isotope Dose: 30.5 mCi
TID: 0.94

## 2021-09-08 NOTE — Addendum Note (Signed)
Addended byShirlee More on: 09/08/2021 07:43 AM ? ? Modules accepted: Orders ? ?

## 2021-09-16 DIAGNOSIS — I5032 Chronic diastolic (congestive) heart failure: Secondary | ICD-10-CM | POA: Diagnosis not present

## 2021-09-16 DIAGNOSIS — M1990 Unspecified osteoarthritis, unspecified site: Secondary | ICD-10-CM | POA: Diagnosis not present

## 2021-09-16 DIAGNOSIS — K219 Gastro-esophageal reflux disease without esophagitis: Secondary | ICD-10-CM | POA: Diagnosis not present

## 2021-09-16 DIAGNOSIS — I10 Essential (primary) hypertension: Secondary | ICD-10-CM | POA: Diagnosis not present

## 2021-09-16 DIAGNOSIS — J309 Allergic rhinitis, unspecified: Secondary | ICD-10-CM | POA: Diagnosis not present

## 2021-09-16 DIAGNOSIS — R110 Nausea: Secondary | ICD-10-CM | POA: Diagnosis not present

## 2021-09-16 DIAGNOSIS — I471 Supraventricular tachycardia: Secondary | ICD-10-CM | POA: Diagnosis not present

## 2021-09-16 DIAGNOSIS — N3946 Mixed incontinence: Secondary | ICD-10-CM | POA: Diagnosis not present

## 2021-09-16 DIAGNOSIS — F418 Other specified anxiety disorders: Secondary | ICD-10-CM | POA: Diagnosis not present

## 2021-09-18 DIAGNOSIS — I951 Orthostatic hypotension: Secondary | ICD-10-CM | POA: Diagnosis not present

## 2021-09-18 DIAGNOSIS — E782 Mixed hyperlipidemia: Secondary | ICD-10-CM | POA: Diagnosis not present

## 2021-09-18 DIAGNOSIS — I25118 Atherosclerotic heart disease of native coronary artery with other forms of angina pectoris: Secondary | ICD-10-CM | POA: Diagnosis not present

## 2021-09-18 DIAGNOSIS — I10 Essential (primary) hypertension: Secondary | ICD-10-CM | POA: Diagnosis not present

## 2021-10-05 DIAGNOSIS — R1084 Generalized abdominal pain: Secondary | ICD-10-CM | POA: Diagnosis not present

## 2021-10-05 DIAGNOSIS — H60502 Unspecified acute noninfective otitis externa, left ear: Secondary | ICD-10-CM | POA: Diagnosis not present

## 2021-10-05 DIAGNOSIS — I10 Essential (primary) hypertension: Secondary | ICD-10-CM | POA: Diagnosis not present

## 2021-10-05 DIAGNOSIS — N3000 Acute cystitis without hematuria: Secondary | ICD-10-CM | POA: Diagnosis not present

## 2021-10-05 DIAGNOSIS — Z20822 Contact with and (suspected) exposure to covid-19: Secondary | ICD-10-CM | POA: Diagnosis not present

## 2021-10-05 DIAGNOSIS — R001 Bradycardia, unspecified: Secondary | ICD-10-CM | POA: Diagnosis not present

## 2021-10-05 DIAGNOSIS — R42 Dizziness and giddiness: Secondary | ICD-10-CM | POA: Diagnosis not present

## 2021-10-05 DIAGNOSIS — N281 Cyst of kidney, acquired: Secondary | ICD-10-CM | POA: Diagnosis not present

## 2021-10-05 DIAGNOSIS — K828 Other specified diseases of gallbladder: Secondary | ICD-10-CM | POA: Diagnosis not present

## 2021-10-05 DIAGNOSIS — R1030 Lower abdominal pain, unspecified: Secondary | ICD-10-CM | POA: Diagnosis not present

## 2021-10-13 DIAGNOSIS — N3946 Mixed incontinence: Secondary | ICD-10-CM | POA: Diagnosis not present

## 2021-10-13 DIAGNOSIS — K219 Gastro-esophageal reflux disease without esophagitis: Secondary | ICD-10-CM | POA: Diagnosis not present

## 2021-10-13 DIAGNOSIS — J309 Allergic rhinitis, unspecified: Secondary | ICD-10-CM | POA: Diagnosis not present

## 2021-10-13 DIAGNOSIS — M1990 Unspecified osteoarthritis, unspecified site: Secondary | ICD-10-CM | POA: Diagnosis not present

## 2021-10-13 DIAGNOSIS — N3090 Cystitis, unspecified without hematuria: Secondary | ICD-10-CM | POA: Diagnosis not present

## 2021-10-13 DIAGNOSIS — K580 Irritable bowel syndrome with diarrhea: Secondary | ICD-10-CM | POA: Diagnosis not present

## 2021-10-13 DIAGNOSIS — F418 Other specified anxiety disorders: Secondary | ICD-10-CM | POA: Diagnosis not present

## 2021-10-13 DIAGNOSIS — I10 Essential (primary) hypertension: Secondary | ICD-10-CM | POA: Diagnosis not present

## 2021-10-13 DIAGNOSIS — R110 Nausea: Secondary | ICD-10-CM | POA: Diagnosis not present

## 2021-10-22 DIAGNOSIS — R1110 Vomiting, unspecified: Secondary | ICD-10-CM | POA: Diagnosis not present

## 2021-10-22 DIAGNOSIS — K219 Gastro-esophageal reflux disease without esophagitis: Secondary | ICD-10-CM | POA: Diagnosis not present

## 2021-10-22 DIAGNOSIS — K580 Irritable bowel syndrome with diarrhea: Secondary | ICD-10-CM | POA: Diagnosis not present

## 2021-10-23 DIAGNOSIS — M19012 Primary osteoarthritis, left shoulder: Secondary | ICD-10-CM | POA: Diagnosis not present

## 2021-10-23 DIAGNOSIS — M25512 Pain in left shoulder: Secondary | ICD-10-CM | POA: Diagnosis not present

## 2021-11-21 DIAGNOSIS — K580 Irritable bowel syndrome with diarrhea: Secondary | ICD-10-CM | POA: Diagnosis not present

## 2021-11-22 NOTE — Progress Notes (Unsigned)
Cardiology Office Note:    Date:  11/22/2021   ID:  Heather Mccoy, DOB 11-Nov-1945, MRN [id number]  PCP:  Haydee Salter, NP  Cardiologist:  Shirlee More, MD    Referring MD: Haydee Salter, NP    ASSESSMENT:    No diagnosis found. PLAN:    In order of problems listed above:  ***   Next appointment: ***   Medication Adjustments/Labs and Tests Ordered: Current medicines are reviewed at length with the patient today.  Concerns regarding medicines are outlined above.  No orders of the defined types were placed in this encounter.  No orders of the defined types were placed in this encounter.   No chief complaint on file.   History of Present Illness:    Heather Mccoy is a 76 y.o. female with a hx of CAD with a cardiac CTA January 2021 at this severely elevated calcium score 345/85 and moderate CAD 50 to 69% mid right coronary artery 25 to 49% proximal LAD hypertension orthostatic hypotension hyperlipidemia and paroxysmal atrial fibrillation last seen 08/31/2021. Compliance with diet, lifestyle and medications: ***  Following her visit she underwent myocardial perfusion study 09/03/2021 showing normal perfusion and function there is no ischemic ST abnormality.  Her event monitor reported 09/23/2021 showed a 6 beat run of APCs and brief episodes of atrial tachycardia. Past Medical History:  Diagnosis Date   Anxiety    Aortic valve sclerosis    Arthritis    Bell's palsy 07/31/2019   Cancer (HCC)    colon   Chronic diarrhea of unknown origin 01/12/2012   COPD (chronic obstructive pulmonary disease) (HCC)    Coronary artery disease of native artery of native heart with stable angina pectoris ([place]) 06/11/2019   Diabetes mellitus    Dizziness 09/18/2019   Dysrhythmia    Epigastric pain    Essential hypertension 06/11/2019   Hyperlipemia 01/19/2019   Hyperlipidemia    Hypertension    Mixed hyperlipidemia 06/11/2019   Mood disorder (HCC)    Nausea and vomiting    Normocytic  anemia 09/12/2014   Orthostatic dizziness 03/03/2020   Orthostatic hypotension    Paroxysmal atrial tachycardia (HCC) 06/11/2019   Polypharmacy    Shortness of breath    Type II diabetes mellitus ([place]) 01/19/2019   Unsteady gait    Vitamin B12 deficiency 09/13/2014   Weight loss 09/12/2014    Past Surgical History:  Procedure Laterality Date   APPENDECTOMY     BACK SURGERY     CHOLECYSTECTOMY     COLON SURGERY     COLONOSCOPY  10/25/2018   Dr Orlena Sheldon Normal post-operative colon   ESOPHAGOGASTRODUODENOSCOPY  10/25/2018   Normal EGD to second portion of duodenum. 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     Allergies:   Codeine, Morphine, Nitroglycerin, Tylenol [acetaminophen], and Oxycodone   Social History   Socioeconomic History   Marital status: Widowed    Spouse name: Not on file   Number of children: Not on file   Years of education: Not on file   Highest education level: Not on  file  Occupational History   Not on file  Tobacco Use   Smoking status: Former    Packs/day: 0.50    Types: Cigarettes    Quit date: 04/30/2020    Years since quitting: 1.5    Passive exposure: Past   Smokeless tobacco: Never  Vaping Use   Vaping Use: Never used  Substance and Sexual Activity   Alcohol use: No   Drug use: No   Sexual activity: Never  Other Topics Concern   Not on file  Social History Narrative   Not on file   Social Determinants of Health   Financial Resource Strain: Not on file  Food Insecurity: Not on file  Transportation Needs: Not on file  Physical Activity: Not on file   Stress: Not on file  Social Connections: Not on file     Family History: The patient's ***family history includes CVA in her father; Cerebral aneurysm in her mother; Heart attack in her father and sister. ROS:   Please see the history of present illness.    All other systems reviewed and are negative.  EKGs/Labs/Other Studies Reviewed:    The following studies were reviewed today:  EKG:  EKG ordered today and personally reviewed.  The ekg ordered today demonstrates ***  Recent Labs: 08/31/2021: BUN 31; Creatinine, Ser 1.11; Hemoglobin 13.5; Platelets 211; Potassium 4.8; Sodium 141  Recent Lipid Panel    Component Value Date/Time   CHOL 70 03/03/2020 0436   TRIG 68 03/03/2020 0436   HDL 36 (L) 03/03/2020 0436   CHOLHDL 1.9 03/03/2020 0436   VLDL 14 03/03/2020 0436   LDLCALC 20 03/03/2020 0436    Physical Exam:    VS:  There were no vitals taken for this visit.    Wt Readings from Last 3 Encounters:  09/03/21 147 lb (66.7 kg)  08/31/21 147 lb (66.7 kg)  09/30/20 142 lb (64.4 kg)     GEN: *** Well nourished, well developed in no acute distress HEENT: Normal NECK: No JVD; No carotid bruits LYMPHATICS: No lymphadenopathy CARDIAC: ***RRR, no murmurs, rubs, gallops RESPIRATORY:  Clear to auscultation without rales, wheezing or rhonchi  ABDOMEN: Soft, non-tender, non-distended MUSCULOSKELETAL:  No edema; No deformity  SKIN: Warm and dry NEUROLOGIC:  Alert and oriented x 3 PSYCHIATRIC:  Normal affect    Signed, Shirlee More, MD  11/22/2021 9:42 AM    [place]

## 2021-11-23 ENCOUNTER — Ambulatory Visit (INDEPENDENT_AMBULATORY_CARE_PROVIDER_SITE_OTHER): Payer: Medicare HMO | Admitting: Cardiology

## 2021-11-23 ENCOUNTER — Encounter: Payer: Self-pay | Admitting: Cardiology

## 2021-11-23 VITALS — BP 130/62 | HR 67 | Ht 62.0 in | Wt 144.2 lb

## 2021-11-23 DIAGNOSIS — I471 Supraventricular tachycardia: Secondary | ICD-10-CM

## 2021-11-23 DIAGNOSIS — Z79899 Other long term (current) drug therapy: Secondary | ICD-10-CM | POA: Diagnosis not present

## 2021-11-23 DIAGNOSIS — I951 Orthostatic hypotension: Secondary | ICD-10-CM

## 2021-11-23 DIAGNOSIS — I25118 Atherosclerotic heart disease of native coronary artery with other forms of angina pectoris: Secondary | ICD-10-CM

## 2021-11-23 DIAGNOSIS — I10 Essential (primary) hypertension: Secondary | ICD-10-CM

## 2021-11-23 DIAGNOSIS — E782 Mixed hyperlipidemia: Secondary | ICD-10-CM | POA: Diagnosis not present

## 2021-11-23 NOTE — Addendum Note (Signed)
Addended by: Edwyna Shell I on: 11/23/2021 09:21 AM   Modules accepted: Orders

## 2021-11-23 NOTE — Patient Instructions (Addendum)
Medication Instructions:  Your physician recommends that you continue on your current medications as directed. Please refer to the Current Medication list given to you today.  *If you need a refill on your cardiac medications before your next appointment, please call your pharmacy*   Lab Work: Your physician recommends that you return for lab work in:   Labs today: CMP, Lipids  If you have labs (blood work) drawn today and your tests are completely normal, you will receive your results only by: [place] (if you have [place]) OR A paper copy in the mail If you have any lab test that is abnormal or we need to change your treatment, we will call you to review the results.   Testing/Procedures: None   Follow-Up: At [facility], you and your health needs are our priority.  As part of our continuing mission to provide you with exceptional heart care, we have created designated Provider Care Teams.  These Care Teams include your primary Cardiologist (physician) and Advanced Practice Providers (APPs -  Physician Assistants and Nurse Practitioners) who all work together to provide you with the care you need, when you need it.  We recommend signing up for the patient portal called "MyChart".  Sign up information is provided on this After Visit Summary.  MyChart is used to connect with patients for Virtual Visits (Telemedicine).  Patients are able to view lab/test results, encounter notes, upcoming appointments, etc.  Non-urgent messages can be sent to your provider as well.   To learn more about what you can do with MyChart, go to [email or web address].    Your next appointment:   3 month(s)  The format for your next appointment:   In Person  Provider:   Shirlee More, MD    Other Instructions Purchase mobile Kardia to capture episodes of rapid heart rate  Important Information About Sugar         1. Avoid all over-the-counter antihistamines except  Claritin/Loratadine and Zyrtec/Cetrizine. 2. Avoid all combination including cold sinus allergies flu decongestant and sleep medications 3. You can use Robitussin DM Mucinex and Mucinex DM for cough. 4. can use Tylenol aspirin ibuprofen and naproxen but no combinations such as sleep or sinus.   [email or web address]        FDA-cleared, clinical grade mobile EKG monitor: Heather Mccoy is the most clinically-validated mobile EKG used by the world's leading cardiac care medical professionals With Basic service, know instantly if your heart rhythm is normal or if atrial fibrillation is detected, and email the last single EKG recording to yourself or your doctor Premium service, available for purchase through the Kardia app for $9.99 per month or $99 per year, includes unlimited history and storage of your EKG recordings, a monthly EKG summary report to share with your doctor, along with the ability to track your blood pressure, activity and weight Includes one KardiaMobile phone clip FREE SHIPPING: Standard delivery 1-3 business days. Orders placed by 11:00am PST will ship that afternoon. Otherwise, will ship next business day. All orders ship via [employer] from [place], [place]

## 2021-11-24 DIAGNOSIS — M1712 Unilateral primary osteoarthritis, left knee: Secondary | ICD-10-CM | POA: Diagnosis not present

## 2021-11-24 LAB — COMPREHENSIVE METABOLIC PANEL
ALT: 14 IU/L (ref 0–32)
AST: 26 IU/L (ref 0–40)
Albumin/Globulin Ratio: 1.4 (ref 1.2–2.2)
Albumin: 3.8 g/dL (ref 3.8–4.8)
Alkaline Phosphatase: 56 IU/L (ref 44–121)
BUN/Creatinine Ratio: 24 (ref 12–28)
BUN: 17 mg/dL (ref 8–27)
Bilirubin Total: 0.5 mg/dL (ref 0.0–1.2)
CO2: 20 mmol/L (ref 20–29)
Calcium: 9 mg/dL (ref 8.7–10.3)
Chloride: 108 mmol/L — ABNORMAL HIGH (ref 96–106)
Creatinine, Ser: 0.72 mg/dL (ref 0.57–1.00)
Globulin, Total: 2.8 g/dL (ref 1.5–4.5)
Glucose: 90 mg/dL (ref 70–99)
Potassium: 4.5 mmol/L (ref 3.5–5.2)
Sodium: 141 mmol/L (ref 134–144)
Total Protein: 6.6 g/dL (ref 6.0–8.5)
eGFR: 87 mL/min/{1.73_m2} (ref >59)

## 2021-11-24 LAB — LIPID PANEL
Chol/HDL Ratio: 2.7 ratio (ref 0.0–4.4)
Cholesterol, Total: 101 mg/dL (ref 100–199)
HDL: 38 mg/dL — ABNORMAL LOW (ref >39)
LDL Chol Calc (NIH): 39 mg/dL (ref 0–99)
Triglycerides: 138 mg/dL (ref 0–149)
VLDL Cholesterol Cal: 24 mg/dL (ref 5–40)

## 2021-11-25 DIAGNOSIS — I2510 Atherosclerotic heart disease of native coronary artery without angina pectoris: Secondary | ICD-10-CM | POA: Diagnosis not present

## 2021-11-25 DIAGNOSIS — I951 Orthostatic hypotension: Secondary | ICD-10-CM | POA: Diagnosis not present

## 2021-11-25 DIAGNOSIS — N3946 Mixed incontinence: Secondary | ICD-10-CM | POA: Diagnosis not present

## 2021-11-25 DIAGNOSIS — R609 Edema, unspecified: Secondary | ICD-10-CM | POA: Diagnosis not present

## 2021-11-25 DIAGNOSIS — D509 Iron deficiency anemia, unspecified: Secondary | ICD-10-CM | POA: Diagnosis not present

## 2021-11-25 DIAGNOSIS — K580 Irritable bowel syndrome with diarrhea: Secondary | ICD-10-CM | POA: Diagnosis not present

## 2021-11-25 DIAGNOSIS — I471 Supraventricular tachycardia: Secondary | ICD-10-CM | POA: Diagnosis not present

## 2021-11-25 DIAGNOSIS — D72829 Elevated white blood cell count, unspecified: Secondary | ICD-10-CM | POA: Diagnosis not present

## 2021-11-25 DIAGNOSIS — E785 Hyperlipidemia, unspecified: Secondary | ICD-10-CM | POA: Diagnosis not present

## 2021-12-03 DIAGNOSIS — Z043 Encounter for examination and observation following other accident: Secondary | ICD-10-CM | POA: Diagnosis not present

## 2021-12-03 DIAGNOSIS — S0990XA Unspecified injury of head, initial encounter: Secondary | ICD-10-CM | POA: Diagnosis not present

## 2021-12-03 DIAGNOSIS — R519 Headache, unspecified: Secondary | ICD-10-CM | POA: Diagnosis not present

## 2021-12-03 DIAGNOSIS — M79605 Pain in left leg: Secondary | ICD-10-CM | POA: Diagnosis not present

## 2021-12-03 DIAGNOSIS — I10 Essential (primary) hypertension: Secondary | ICD-10-CM | POA: Diagnosis not present

## 2021-12-03 DIAGNOSIS — S51011A Laceration without foreign body of right elbow, initial encounter: Secondary | ICD-10-CM | POA: Diagnosis not present

## 2021-12-03 DIAGNOSIS — M25562 Pain in left knee: Secondary | ICD-10-CM | POA: Diagnosis not present

## 2021-12-03 DIAGNOSIS — M25521 Pain in right elbow: Secondary | ICD-10-CM | POA: Diagnosis not present

## 2021-12-03 DIAGNOSIS — S8992XA Unspecified injury of left lower leg, initial encounter: Secondary | ICD-10-CM | POA: Diagnosis not present

## 2021-12-07 DIAGNOSIS — M1712 Unilateral primary osteoarthritis, left knee: Secondary | ICD-10-CM | POA: Diagnosis not present

## 2021-12-07 DIAGNOSIS — M25562 Pain in left knee: Secondary | ICD-10-CM | POA: Diagnosis not present

## 2021-12-08 DIAGNOSIS — M1990 Unspecified osteoarthritis, unspecified site: Secondary | ICD-10-CM | POA: Diagnosis not present

## 2021-12-08 DIAGNOSIS — D72829 Elevated white blood cell count, unspecified: Secondary | ICD-10-CM | POA: Diagnosis not present

## 2021-12-08 DIAGNOSIS — R21 Rash and other nonspecific skin eruption: Secondary | ICD-10-CM | POA: Diagnosis not present

## 2021-12-08 DIAGNOSIS — W19XXXA Unspecified fall, initial encounter: Secondary | ICD-10-CM | POA: Diagnosis not present

## 2021-12-08 DIAGNOSIS — Z6823 Body mass index (BMI) 23.0-23.9, adult: Secondary | ICD-10-CM | POA: Diagnosis not present

## 2021-12-09 ENCOUNTER — Other Ambulatory Visit: Payer: Self-pay | Admitting: Nurse Practitioner

## 2021-12-27 ENCOUNTER — Other Ambulatory Visit: Payer: Self-pay | Admitting: Oncology

## 2021-12-27 DIAGNOSIS — D72829 Elevated white blood cell count, unspecified: Secondary | ICD-10-CM

## 2021-12-27 NOTE — Progress Notes (Signed)
[facility]  [address] [place],  [place]  [address] [phone]  Clinic Day:  12/28/2021  Referring physician: Haydee Salter, NP   HISTORY OF PRESENT ILLNESS:  The patient is a 76 y.o. female who I was asked to consult upon for leukocytosis.  Labs in July 2023 at her primary care office showed an elevated white count of 15.8, with over 90% of these cells being neutrophils. According to the patient, she did have an ear infection approximately 3 months ago.  She was placed on a course of antibiotics to treat this.  She also claims to have had a steroid injection in her knee approximately 1 month ago, which was around the same time her labs showed her elevated white count.  She has occasional night sweats, but denies having fevers, significant weight loss or lymphadenopathy which concerns her for her leukocytosis being due to an underlying hematologic malignancy.  She also denies having undergone a splenectomy, which can lead to chronic leukocytosis.  To her knowledge, there is no family history of leukocytosis or other hematologic disorders.  PAST MEDICAL HISTORY:   Past Medical History:  Diagnosis Date   Anxiety    Aortic valve sclerosis    Arthritis    Bell's palsy 07/31/2019   Cancer (HCC)    colon   Chronic diarrhea of unknown origin 01/12/2012   COPD (chronic obstructive pulmonary disease) (HCC)    Coronary artery disease of native artery of native heart with stable angina pectoris ([place]) 06/11/2019   Diabetes mellitus    Dizziness 09/18/2019   Dysrhythmia    Epigastric pain    Essential hypertension 06/11/2019   Hyperlipemia 01/19/2019   Hyperlipidemia    Hypertension    Mixed hyperlipidemia 06/11/2019   Mood disorder (HCC)    Nausea and vomiting    Normocytic anemia 09/12/2014   Orthostatic dizziness 03/03/2020   Orthostatic hypotension    Paroxysmal atrial tachycardia (HCC) 06/11/2019   Polypharmacy    Shortness of breath    Type II diabetes  mellitus ([place]) 01/19/2019   Unsteady gait    Vitamin B12 deficiency 09/13/2014   Weight loss 09/12/2014    PAST SURGICAL HISTORY:   Past Surgical History:  Procedure Laterality Date   APPENDECTOMY     BACK SURGERY     CHOLECYSTECTOMY     COLON SURGERY     COLONOSCOPY  10/25/2018   Dr Orlena Sheldon Normal post-operative colon   ESOPHAGOGASTRODUODENOSCOPY  10/25/2018   Normal EGD to second portion of duodenum. 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     meclizine (ANTIVERT) 25 MG tablet Take 25 mg by mouth 3 (three) times daily as needed for dizziness.     methocarbamol (ROBAXIN) 750 MG tablet Take 750 mg by mouth 4 (four) times daily.     traZODone (DESYREL) 50 MG tablet Take 50 mg by mouth at bedtime.     Vibegron (GEMTESA) 75 MG TABS Take by mouth.  alendronate (FOSAMAX) 70 MG tablet Take 70 mg by mouth every Sunday. Take with a full glass of water on an empty stomach.     aspirin EC 81 MG tablet Take 81 mg by mouth daily.     atorvastatin (LIPITOR) 40 MG tablet Take 40 mg by mouth daily.     carvedilol (COREG) 12.5 MG tablet Take 12.5 mg by mouth 2 (two) times daily with a meal.     citalopram (CELEXA) 10 MG tablet Take 10 mg by mouth daily.     dicyclomine (BENTYL) 20 MG tablet Take 20 mg by mouth 3 (three) times daily.     diphenoxylate-atropine (LOMOTIL) 2.5-0.025 MG tablet Take 2 tablets by mouth 4 (four) times daily.     famotidine (PEPCID) 40 MG tablet Take 40 mg by mouth 2 (two) times daily.     FEROSUL 325 (65 Fe) MG tablet  Take 325 mg by mouth daily.     flecainide (TAMBOCOR) 50 MG tablet Take 1 tablet (50 mg total) by mouth every 12 (twelve) hours. 30 tablet 0   furosemide (LASIX) 20 MG tablet Take 20 mg by mouth daily as needed for edema.     meloxicam (MOBIC) 7.5 MG tablet Take 7.5 mg by mouth 2 (two) times daily.     midodrine (PROAMATINE) 5 MG tablet Take 5 mg by mouth 3 (three) times daily with meals.     montelukast (SINGULAIR) 10 MG tablet TAKE 1 TABLET BY MOUTH AT BEDTIME 90 tablet 3   neomycin-polymyxin-hydrocortisone (CORTISPORIN) OTIC solution Apply 2-3 drops to the ingrown toenail site twice daily. Cover with band-aid. 10 mL 0   ondansetron (ZOFRAN) 4 MG tablet Take 4 mg by mouth 2 (two) times daily as needed for nausea.     pantoprazole (PROTONIX) 40 MG tablet Take 40 mg by mouth daily.     potassium chloride SA (KLOR-CON) 20 MEQ tablet Take 20 mEq by mouth daily.     No current facility-administered medications for this visit.    ALLERGIES:   Allergies  Allergen Reactions   Codeine Hives   Morphine Dermatitis   Nitroglycerin Nausea And Vomiting   Tylenol [Acetaminophen]    Oxycodone Rash    Can take with benadryl.    FAMILY HISTORY:   Family History  Problem Relation Age of Onset   Cerebral aneurysm Mother    CVA Father    Heart attack Father    Heart attack Sister     SOCIAL HISTORY:  The patient was born and raised in [place].  She currently lives in town.  She is widowed.  She worked in [employer] for 50 years.  She has 1 child and 3 grandchildren.  She did smoke for 15 years but quit earlier this year.  There is no history of alcohol use.  REVIEW OF SYSTEMS:  Review of Systems  Constitutional:  Negative for fatigue and fever.  HENT:   Negative for hearing loss and sore throat.   Eyes:  Negative for eye problems.  Respiratory:  Positive for cough. Negative for chest tightness and hemoptysis.   Cardiovascular:  Positive for palpitations. Negative for chest pain.   Gastrointestinal:  Positive for constipation and diarrhea. Negative for abdominal distention, abdominal pain, blood in stool, nausea and vomiting.  Endocrine: Negative for hot flashes.  Genitourinary:  Negative for difficulty urinating, dysuria, frequency, hematuria and nocturia.   Musculoskeletal:  Positive for arthralgias. Negative for back pain, gait problem and myalgias.  Skin: Negative.  Negative  for itching and rash.  Neurological:  Positive for dizziness. Negative for extremity weakness, gait problem, headaches, light-headedness and numbness.  Hematological: Negative.   Psychiatric/Behavioral:  Positive for depression. Negative for suicidal ideas. The patient is not nervous/anxious.      PHYSICAL EXAM:  Blood pressure (!) 145/57, pulse 66, temperature 98.3 F (36.8 C), resp. rate 16, height '5\' 2"'$  (1.575 m), weight 140 lb 14.4 oz (63.9 kg), SpO2 95 %. Wt Readings from Last 3 Encounters:  12/28/21 140 lb 14.4 oz (63.9 kg)  11/23/21 144 lb 3.7 oz (65.4 kg)  09/03/21 147 lb (66.7 kg)   Body mass index is 25.77 kg/m. Performance status (ECOG): 1 - Symptomatic but completely ambulatory Physical Exam Constitutional:      Appearance: Normal appearance. She is not ill-appearing.  HENT:     Mouth/Throat:     Mouth: Mucous membranes are moist.     Pharynx: Oropharynx is clear. No oropharyngeal exudate or posterior oropharyngeal erythema.  Cardiovascular:     Rate and Rhythm: Normal rate and regular rhythm.     Heart sounds: No murmur heard.    No friction rub. No gallop.  Pulmonary:     Effort: Pulmonary effort is normal. No respiratory distress.     Breath sounds: Normal breath sounds. No wheezing, rhonchi or rales.  Abdominal:     General: Bowel sounds are normal. There is no distension.     Palpations: Abdomen is soft. There is no mass.     Tenderness: There is no abdominal tenderness.  Musculoskeletal:        General: No swelling.     Right lower leg: No edema.     Left  lower leg: No edema.  Lymphadenopathy:     Cervical: No cervical adenopathy.     Upper Body:     Right upper body: No supraclavicular or axillary adenopathy.     Left upper body: No supraclavicular or axillary adenopathy.     Lower Body: No right inguinal adenopathy. No left inguinal adenopathy.  Skin:    General: Skin is warm.     Coloration: Skin is not jaundiced.     Findings: No lesion or rash.  Neurological:     General: No focal deficit present.     Mental Status: She is alert and oriented to person, place, and time. Mental status is at baseline.  Psychiatric:        Mood and Affect: Mood normal.        Behavior: Behavior normal.        Thought Content: Thought content normal.   LABS:       Latest Ref Rng & Units 12/28/2021   12:00 AM 08/31/2021    4:29 PM 03/06/2020    3:32 AM  CBC  WBC  10.6     18.2  8.9   Hemoglobin 12.0 - 16.0 12.8     13.5  11.3   Hematocrit 36 - 46 39     39.7  35.2   Platelets 150 - 400 K/uL 219     211  162      This result is from an external source.      Latest Ref Rng & Units 12/28/2021   12:00 AM 11/23/2021    9:35 AM 08/31/2021    4:29 PM  CMP  Glucose 70 - 99 mg/dL  90  103   BUN 4 - '21 21     17  31   '$ Creatinine 0.5 -  1.1 0.8     0.72  1.11   Sodium 137 - 147 141     141  141   Potassium 3.5 - 5.1 mEq/L 4.3     4.5  4.8   Chloride 99 - 108 111     108  108   CO2 13 - '22 21     20  17   '$ Calcium 8.7 - 10.7 9.4     9.0  9.1   Total Protein 6.0 - 8.5 g/dL  6.6    Total Bilirubin 0.0 - 1.2 mg/dL  0.5    Alkaline Phos 25 - 125 55     56    AST 13 - 35 28     26    ALT 7 - 35 U/L 25     14       This result is from an external source.   ASSESSMENT & PLAN:  A 76 y.o. female who I was asked to consult upon for leukocytosis.  I am pleased as her labs today show that her leukocytosis has normalized.  More than likely, her leukocytosis was related to steroid injection she recently received.  Steroids lead to leukocytosis by causing white  cells to migrate from blood vessel walls into the active blood stream.  Over time, the white cells return to the blood vessel sidewalls, which leads to one's white count returning to normal.  Overall, there is nothing per history or physical exam to suggest her previous leukocytosis is due to anything ominous.  I do feel comfortable turning her care back over to her primary care office with recommendation that her CBC be checked, at most, 1-2 times per year.  I would not have a problem seeing her back in the future if new hematologic issues arise that require repeat clinical assessment.. The patient understands all the plans discussed today and is in agreement with them.  I do appreciate Haydee Salter, NP for his new consult.   Tiwanna Tuch Macarthur Critchley, MD

## 2021-12-28 ENCOUNTER — Encounter: Payer: Self-pay | Admitting: Oncology

## 2021-12-28 ENCOUNTER — Inpatient Hospital Stay: Payer: Medicare HMO | Attending: Oncology | Admitting: Oncology

## 2021-12-28 ENCOUNTER — Inpatient Hospital Stay: Payer: Medicare HMO

## 2021-12-28 DIAGNOSIS — Z87891 Personal history of nicotine dependence: Secondary | ICD-10-CM

## 2021-12-28 DIAGNOSIS — E119 Type 2 diabetes mellitus without complications: Secondary | ICD-10-CM

## 2021-12-28 DIAGNOSIS — I10 Essential (primary) hypertension: Secondary | ICD-10-CM | POA: Diagnosis not present

## 2021-12-28 DIAGNOSIS — D72829 Elevated white blood cell count, unspecified: Secondary | ICD-10-CM | POA: Diagnosis not present

## 2021-12-28 DIAGNOSIS — Z79899 Other long term (current) drug therapy: Secondary | ICD-10-CM | POA: Diagnosis not present

## 2021-12-28 DIAGNOSIS — D649 Anemia, unspecified: Secondary | ICD-10-CM | POA: Diagnosis not present

## 2021-12-28 LAB — HEPATIC FUNCTION PANEL
ALT: 25 U/L (ref 7–35)
AST: 28 (ref 13–35)
Alkaline Phosphatase: 55 (ref 25–125)
Bilirubin, Total: 0.5

## 2021-12-28 LAB — CBC AND DIFFERENTIAL
HCT: 39 (ref 36–46)
Hemoglobin: 12.8 (ref 12.0–16.0)
Neutrophils Absolute: 6.57
Platelets: 219 10*3/uL (ref 150–400)
WBC: 10.6

## 2021-12-28 LAB — CBC: RBC: 3.97 (ref 3.87–5.11)

## 2021-12-28 LAB — BASIC METABOLIC PANEL
BUN: 21 (ref 4–21)
CO2: 21 (ref 13–22)
Chloride: 111 — AB (ref 99–108)
Creatinine: 0.8 (ref 0.5–1.1)
Glucose: 112
Potassium: 4.3 mEq/L (ref 3.5–5.1)
Sodium: 141 (ref 137–147)

## 2021-12-28 LAB — COMPREHENSIVE METABOLIC PANEL
Albumin: 4.2 (ref 3.5–5.0)
Calcium: 9.4 (ref 8.7–10.7)

## 2022-01-18 DIAGNOSIS — D72829 Elevated white blood cell count, unspecified: Secondary | ICD-10-CM

## 2022-01-18 HISTORY — DX: Elevated white blood cell count, unspecified: D72.829

## 2022-01-24 NOTE — Progress Notes (Unsigned)
  Subjective:  Patient ID: JACKELINE GUTKNECHT, female    DOB: May 03, 1946,  MRN: [id number]  No chief complaint on file.   76 y.o. female presents with the above complaint. History confirmed with patient. ***  Objective:  Physical Exam: warm, good capillary refill, nail exam {:315758}, no trophic changes or ulcerative lesions. {pod dm exam:23670::"DP pulses palpable","PT pulses palpable","protective sensation intact"} Left Foot: {exam; foot:5774::"normal exam, no swelling, tenderness, instability; ligaments intact, full range of motion of all ankle/foot joints"}  Right Foot: {exam; foot:5774::"normal exam, no swelling, tenderness, instability; ligaments intact, full range of motion of all ankle/foot joints"}   No images are attached to the encounter.  Assessment:  No diagnosis found.   Plan:  Patient was evaluated and treated and all questions answered.  {Pod Diagnoses:23508} {Pod Routine Foot Care:23494}  No follow-ups on file.

## 2022-01-25 ENCOUNTER — Ambulatory Visit (INDEPENDENT_AMBULATORY_CARE_PROVIDER_SITE_OTHER): Payer: Medicare HMO | Admitting: Podiatry

## 2022-01-25 DIAGNOSIS — B351 Tinea unguium: Secondary | ICD-10-CM | POA: Diagnosis not present

## 2022-01-25 DIAGNOSIS — M79609 Pain in unspecified limb: Secondary | ICD-10-CM

## 2022-01-25 DIAGNOSIS — L600 Ingrowing nail: Secondary | ICD-10-CM | POA: Diagnosis not present

## 2022-02-10 DIAGNOSIS — R059 Cough, unspecified: Secondary | ICD-10-CM | POA: Diagnosis not present

## 2022-02-10 DIAGNOSIS — R062 Wheezing: Secondary | ICD-10-CM | POA: Diagnosis not present

## 2022-02-10 DIAGNOSIS — J441 Chronic obstructive pulmonary disease with (acute) exacerbation: Secondary | ICD-10-CM | POA: Diagnosis not present

## 2022-02-10 DIAGNOSIS — R0600 Dyspnea, unspecified: Secondary | ICD-10-CM | POA: Diagnosis not present

## 2022-02-11 ENCOUNTER — Telehealth: Payer: Self-pay | Admitting: Cardiology

## 2022-02-11 ENCOUNTER — Other Ambulatory Visit: Payer: Self-pay

## 2022-02-11 MED ORDER — FLECAINIDE ACETATE 50 MG PO TABS: 50.0000 mg | ORAL_TABLET | Freq: Two times a day (BID) | ORAL | 3 refills | Status: DC

## 2022-02-11 MED ORDER — CARVEDILOL 12.5 MG PO TABS: 12.5000 mg | ORAL_TABLET | Freq: Two times a day (BID) | ORAL | 3 refills | Status: DC

## 2022-02-11 NOTE — Telephone Encounter (Signed)
Patient's Carvedilol and Flecainide were refilled and sent to the patients pharmacy. Patient was informed and had no further questions at this time.

## 2022-02-11 NOTE — Telephone Encounter (Signed)
*  STAT* If patient is at the pharmacy, call can be transferred to refill team.   1. Which medications need to be refilled? (please list name of each medication and dose if known) Carvedilol and Flecainide  2. Which pharmacy/location (including street and city if local pharmacy) is medication to be sent to?[facility] [address] [place] , [place] 3. Do they need a 30 day or 90 day supply? need this call in asap- been without it for 2 weeks- 90 days and refills

## 2022-02-20 DIAGNOSIS — J209 Acute bronchitis, unspecified: Secondary | ICD-10-CM | POA: Diagnosis not present

## 2022-02-20 DIAGNOSIS — R059 Cough, unspecified: Secondary | ICD-10-CM | POA: Diagnosis not present

## 2022-02-20 DIAGNOSIS — R9431 Abnormal electrocardiogram [ECG] [EKG]: Secondary | ICD-10-CM | POA: Diagnosis not present

## 2022-02-20 DIAGNOSIS — J441 Chronic obstructive pulmonary disease with (acute) exacerbation: Secondary | ICD-10-CM | POA: Diagnosis not present

## 2022-02-20 DIAGNOSIS — Z7982 Long term (current) use of aspirin: Secondary | ICD-10-CM | POA: Diagnosis not present

## 2022-02-20 DIAGNOSIS — R0602 Shortness of breath: Secondary | ICD-10-CM | POA: Diagnosis not present

## 2022-02-20 DIAGNOSIS — E119 Type 2 diabetes mellitus without complications: Secondary | ICD-10-CM | POA: Diagnosis not present

## 2022-02-20 DIAGNOSIS — Z79899 Other long term (current) drug therapy: Secondary | ICD-10-CM | POA: Diagnosis not present

## 2022-02-20 DIAGNOSIS — R042 Hemoptysis: Secondary | ICD-10-CM | POA: Diagnosis not present

## 2022-02-20 DIAGNOSIS — R079 Chest pain, unspecified: Secondary | ICD-10-CM | POA: Diagnosis not present

## 2022-02-24 ENCOUNTER — Other Ambulatory Visit: Payer: Self-pay

## 2022-02-25 ENCOUNTER — Ambulatory Visit: Payer: Medicare HMO | Attending: Cardiology | Admitting: Cardiology

## 2022-02-25 ENCOUNTER — Ambulatory Visit: Payer: Medicare HMO | Attending: Cardiology

## 2022-02-25 ENCOUNTER — Encounter: Payer: Self-pay | Admitting: Cardiology

## 2022-02-25 VITALS — BP 140/80 | HR 69 | Ht 62.0 in | Wt 135.0 lb

## 2022-02-25 DIAGNOSIS — I25118 Atherosclerotic heart disease of native coronary artery with other forms of angina pectoris: Secondary | ICD-10-CM

## 2022-02-25 DIAGNOSIS — Z79899 Other long term (current) drug therapy: Secondary | ICD-10-CM | POA: Diagnosis not present

## 2022-02-25 DIAGNOSIS — E782 Mixed hyperlipidemia: Secondary | ICD-10-CM

## 2022-02-25 DIAGNOSIS — I10 Essential (primary) hypertension: Secondary | ICD-10-CM | POA: Diagnosis not present

## 2022-02-25 DIAGNOSIS — I480 Paroxysmal atrial fibrillation: Secondary | ICD-10-CM

## 2022-02-25 DIAGNOSIS — I951 Orthostatic hypotension: Secondary | ICD-10-CM | POA: Diagnosis not present

## 2022-02-25 MED ORDER — METOPROLOL SUCCINATE ER 25 MG PO TB24: 25.0000 mg | ORAL_TABLET | Freq: Every day | ORAL | 3 refills | Status: DC

## 2022-02-25 MED ORDER — FUROSEMIDE 20 MG PO TABS: 20.0000 mg | ORAL_TABLET | Freq: Every day | ORAL | 3 refills | Status: DC

## 2022-02-25 MED ORDER — MAGNESIUM CHLORIDE 64 MG PO TBEC: 1.0000 | DELAYED_RELEASE_TABLET | Freq: Two times a day (BID) | ORAL | 3 refills | Status: DC

## 2022-02-25 NOTE — Addendum Note (Signed)
Addended by: Edwyna Shell I on: 02/25/2022 09:53 AM   Modules accepted: Orders

## 2022-02-25 NOTE — Progress Notes (Signed)
Cardiology Office Note:    Date:  02/25/2022   ID:  Heather Mccoy, DOB 30-Jan-1946, MRN [id number]  PCP:  Elenore Paddy, NP  Cardiologist:  Shirlee More, MD    Referring MD: Haydee Salter, NP    ASSESSMENT:    1. PAF (paroxysmal atrial fibrillation) ([place])   2. High risk medication use   3. Coronary artery disease of native artery of native heart with stable angina pectoris ([place])   4. Essential hypertension   5. Mixed hyperlipidemia   6. Orthostatic hypotension    PLAN:    In order of problems listed above:  Maintaining sinus rhythm I suspect she is having breakthrough episodes of atrial fibrillation I am not sure if flecainide is a good medication for her or can apply 1 weeks ZIO monitor and then decide about referring to EP for alternate antiarrhythmic drug therapy she is not anticoagulated Stable CAD no indication of acute coronary syndrome Stable blood pressure she is taking both midodrine for hypotension and carvedilol for hypertension we will stop her carvedilol substitute Toprol which has very little blood pressure effect and we need to recheck an echocardiogram regarding hypertensive disease with heart failure Continue his statin she is seeing her PCP next week For now continue midodrine unguinal look to discontinue it and follow-up   Next appointment: 6 weeks   Medication Adjustments/Labs and Tests Ordered: Current medicines are reviewed at length with the patient today.  Concerns regarding medicines are outlined above.  No orders of the defined types were placed in this encounter.  No orders of the defined types were placed in this encounter.   Chief Complaint  Patient presents with   Follow-up   Atrial Fibrillation   Coronary Artery Disease   Hypotension    History of Present Illness:    Heather Mccoy is a 76 y.o. female with a hx of paroxysmal atrial fibrillation being maintained on flecainide coronary artery disease hypertension hyperlipidemia and  orthostatic hypotension requiring midodrine therapy for blood pressure support last seen 11/23/2021.  She had acardiac CTA January 2021  severely elevated calcium score 345/85 and moderate CAD 50 to 69% mid right coronary artery 25 to 49% proximal LAD hypertension orthostatic hypotension hyperlipidemia   Compliance with diet, lifestyle and medications: Yes  She was in the emergency room Friday it was prompted that she had cough sputum and some flecks of blood Chest x-ray did not show heart failure BNP level is low at 259 her magnesium was depleted but was not addressed and although its not in the record she said that Dr. Elenore Rota came to her and told her she was having of high frequency of extra beats She is finished her antibiotic finished her prednisone and is seeing her family doctor next week  She does not feel well she is weak she is having a great deal of palpitation at nighttime but also we need to go under the surface she is having orthopnea and is sitting up at nighttime her weight is down she has no edema.  Along the way she was prescribed a diuretic and she takes it perhaps every other day I have asked her to take her diuretic daily  No chest pain or syncope  EKG in the emergency room shows sinus rhythm right bundle branch block Past Medical History:  Diagnosis Date   Anxiety    Aortic valve sclerosis    Arthritis    Bell's palsy 07/31/2019   Cancer ([place])    colon  Chronic diarrhea of unknown origin 01/12/2012   COPD (chronic obstructive pulmonary disease) (HCC)    Coronary artery disease of native artery of native heart with stable angina pectoris ([place]) 06/11/2019   Dizziness 09/18/2019   Dysrhythmia    Epigastric pain    Essential hypertension 06/11/2019   Hyperlipemia 01/19/2019   Hyperlipidemia    Hypertension    Hypotension    Leukocytosis 01/18/2022   Mixed hyperlipidemia 06/11/2019   Mood disorder (HCC)    Nausea and vomiting    Normocytic anemia 09/12/2014    Orthostatic dizziness 03/03/2020   Orthostatic hypotension    Paroxysmal atrial tachycardia 06/11/2019   Polypharmacy    Shortness of breath    Type II diabetes mellitus ([place]) 01/19/2019   Unsteady gait    Vitamin B12 deficiency 09/13/2014   Weight loss 09/12/2014    Past Surgical History:  Procedure Laterality Date   APPENDECTOMY     BACK SURGERY     CHOLECYSTECTOMY     COLON SURGERY     COLONOSCOPY  10/25/2018   Dr Orlena Sheldon Normal post-operative colon   ESOPHAGOGASTRODUODENOSCOPY  10/25/2018   Normal EGD to second portion of duodenum. 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 Take with a full glass of water on an empty stomach.   aspirin EC 81 MG tablet Take 81 mg by mouth daily.   atorvastatin (LIPITOR) 40 MG tablet Take 40 mg by mouth daily.   carvedilol (COREG) 12.5 MG tablet Take 1 tablet (12.5 mg total) by mouth 2 (two) times daily with a meal.   citalopram (CELEXA) 10 MG tablet Take 10 mg by mouth daily.   diclofenac Sodium (VOLTAREN) 1 % GEL Apply 1 Application topically as needed for pain.   dicyclomine (BENTYL) 20 MG tablet Take 20 mg by mouth 3 (three) times daily.   diphenoxylate-atropine (LOMOTIL) 2.5-0.025 MG tablet Take 2 tablets by mouth 4 (four) times daily.   famotidine (PEPCID) 40 MG tablet Take 40 mg by mouth 2 (two) times daily.   FEROSUL 325 (65 Fe) MG tablet Take 325 mg by mouth daily.   flecainide (TAMBOCOR) 50 MG tablet Take 1 tablet (50 mg total) by mouth every 12 (twelve) hours.   furosemide (LASIX) 20 MG tablet Take 20 mg by mouth daily as needed for edema.   loperamide (IMODIUM  A-D) 2 MG tablet Take 2 mg by mouth 4 (four) times daily as needed for diarrhea or loose stools.   meclizine (ANTIVERT) 25 MG tablet Take 25 mg by mouth 3 (three) times daily as needed for dizziness.   meloxicam (MOBIC) 7.5 MG tablet Take 7.5 mg by mouth 2 (two) times daily.   methocarbamol (ROBAXIN) 750 MG tablet Take 750 mg by mouth 4 (four) times daily.   midodrine (PROAMATINE) 5 MG tablet Take 5 mg by mouth 3 (three) times daily with meals.   montelukast (SINGULAIR) 10 MG tablet TAKE 1 TABLET BY MOUTH AT BEDTIME   neomycin-polymyxin-hydrocortisone (CORTISPORIN) OTIC solution Apply 2-3 drops to the ingrown toenail site twice daily. Cover with band-aid.   nystatin cream (MYCOSTATIN) Apply  1 Application topically 2 (two) times daily.   ondansetron (ZOFRAN) 4 MG tablet Take 4 mg by mouth 2 (two) times daily as needed for nausea.   pantoprazole (PROTONIX) 40 MG tablet Take 40 mg by mouth daily.   potassium chloride SA (KLOR-CON) 20 MEQ tablet Take 20 mEq by mouth daily.   predniSONE (DELTASONE) 50 MG tablet Take 50 mg by mouth daily with breakfast.   traZODone (DESYREL) 50 MG tablet Take 50 mg by mouth at bedtime.   Vibegron (GEMTESA) 75 MG TABS Take 75 mg by mouth daily.     Allergies:   Codeine, Morphine, Nitroglycerin, Tylenol [acetaminophen], and Oxycodone   Social History   Socioeconomic History   Marital status: Widowed    Spouse name: Not on file   Number of children: 1   Years of education: 10   Highest education level: Not on file  Occupational History   Occupation: RETIRED [employer]  Tobacco Use   Smoking status: Some Days    Packs/day: 0.50    Types: Cigarettes    Last attempt to quit: 04/30/2020    Years since quitting: 1.8    Passive exposure: Past   Smokeless tobacco: Never  Vaping Use   Vaping Use: Never used  Substance and Sexual Activity   Alcohol use: No   Drug use: No   Sexual activity: Never  Other Topics Concern   Not on file  Social History  Narrative   Not on file   Social Determinants of Health   Financial Resource Strain: Not on file  Food Insecurity: Not on file  Transportation Needs: Not on file  Physical Activity: Not on file  Stress: Not on file  Social Connections: Not on file     Family History: The patient's family history includes CVA in her father; Cerebral aneurysm in her mother; Heart attack in her father and sister. ROS:   Please see the history of present illness.    All other systems reviewed and are negative.  EKGs/Labs/Other Studies Reviewed:    The following studies were reviewed today:  She underwent myocardial perfusion study 09/03/2021 showing normal perfusion and function there is no ischemic ST abnormality.   Her event monitor reported 09/23/2021 showed a 6 beat run of APCs and brief episodes of atrial tachycardia. Her EKG today shows sinus rhythm right bundle branch block Cardiac CTA 06/07/2019: IMPRESSION: 1. Coronary calcium score of 345. This was 7 percentile for age and sex matched control. 2. Normal coronary origin with right dominance. 3. Moderate Non-obstructive coronary artery disease. CAD-RADS 3. 50-69% mid RCA and 25-49% proximal LAD Recent Labs: 12/28/2021: ALT 25; BUN 21; Creatinine 0.8; Hemoglobin 12.8; Platelets 219; Potassium 4.3; Sodium 141  Recent Lipid Panel    Component Value Date/Time   CHOL 101 11/23/2021 0935   TRIG 138 11/23/2021 0935   HDL 38 (L) 11/23/2021 0935   CHOLHDL 2.7 11/23/2021 0935   CHOLHDL 1.9 03/03/2020 0436   VLDL 14 03/03/2020 0436   LDLCALC 39 11/23/2021 0935    Physical Exam:    VS:  BP (!) 140/80 (BP Location: Right Arm, Patient Position: Sitting, Cuff Size: Normal)   Pulse 69   Ht '5\' 2"'$  (1.575 m)   Wt 135 lb (61.2 kg)   SpO2 96%   BMI 24.69 kg/m     Wt Readings from Last 3 Encounters:  02/25/22 135 lb (61.2 kg)  12/28/21 140 lb 14.4 oz (63.9 kg)  11/23/21 144 lb 3.7 oz (65.4 kg)  GEN:  Well nourished, well developed in  no acute distress HEENT: Normal NECK: No JVD; No carotid bruits LYMPHATICS: No lymphadenopathy CARDIAC: RRR, no murmurs, rubs, gallops RESPIRATORY:  Clear to auscultation without rales, wheezing or rhonchi  ABDOMEN: Soft, non-tender, non-distended MUSCULOSKELETAL:  No edema; No deformity  SKIN: Warm and dry NEUROLOGIC:  Alert and oriented x 3 PSYCHIATRIC:  Normal affect    Signed, Shirlee More, MD  02/25/2022 9:26 AM    [place]

## 2022-02-25 NOTE — Patient Instructions (Signed)
Medication Instructions:  Your physician has recommended you make the following change in your medication:   START: Lasix 20 mg daily START: Toprol XL 25 mg daily START: Slo-mag 1 tablet twice daily STOP: Coreg  *If you need a refill on your cardiac medications before your next appointment, please call your pharmacy*   Lab Work: None If you have labs (blood work) drawn today and your tests are completely normal, you will receive your results only by: [place] (if you have MyChart) OR A paper copy in the mail If you have any lab test that is abnormal or we need to change your treatment, we will call you to review the results.   Testing/Procedures: Your physician has requested that you have an echocardiogram. Echocardiography is a painless test that uses sound waves to create images of your heart. It provides your doctor with information about the size and shape of your heart and how well your heart's chambers and valves are working. This procedure takes approximately one hour. There are no restrictions for this procedure.  A zio monitor was ordered today. It will remain on for 7 days. You will then return monitor and event diary in provided box. It takes 1-2 weeks for report to be downloaded and returned to [place]. We will call you with the results. If monitor falls off or has orange flashing light, please call Zio for further instructions.     Follow-Up: At [facility], you and your health needs are our priority.  As part of our continuing mission to provide you with exceptional heart care, we have created designated Provider Care Teams.  These Care Teams include your primary Cardiologist (physician) and Advanced Practice Providers (APPs -  Physician Assistants and Nurse Practitioners) who all work together to provide you with the care you need, when you need it.  We recommend signing up for the patient portal called "MyChart".  Sign up information is provided on this After  Visit Summary.  MyChart is used to connect with patients for Virtual Visits (Telemedicine).  Patients are able to view lab/test results, encounter notes, upcoming appointments, etc.  Non-urgent messages can be sent to your provider as well.   To learn more about what you can do with MyChart, go to [email or web address].    Your next appointment:   6 week(s)  The format for your next appointment:   In Person  Provider:   Shirlee More, MD    Other Instructions None  Important Information About [place]

## 2022-03-02 DIAGNOSIS — I10 Essential (primary) hypertension: Secondary | ICD-10-CM | POA: Diagnosis not present

## 2022-03-02 DIAGNOSIS — Z6824 Body mass index (BMI) 24.0-24.9, adult: Secondary | ICD-10-CM | POA: Diagnosis not present

## 2022-03-02 DIAGNOSIS — E785 Hyperlipidemia, unspecified: Secondary | ICD-10-CM | POA: Diagnosis not present

## 2022-03-02 DIAGNOSIS — I25118 Atherosclerotic heart disease of native coronary artery with other forms of angina pectoris: Secondary | ICD-10-CM | POA: Diagnosis not present

## 2022-03-02 DIAGNOSIS — I951 Orthostatic hypotension: Secondary | ICD-10-CM | POA: Diagnosis not present

## 2022-03-02 DIAGNOSIS — R609 Edema, unspecified: Secondary | ICD-10-CM | POA: Diagnosis not present

## 2022-03-02 DIAGNOSIS — F418 Other specified anxiety disorders: Secondary | ICD-10-CM | POA: Diagnosis not present

## 2022-03-02 DIAGNOSIS — I480 Paroxysmal atrial fibrillation: Secondary | ICD-10-CM | POA: Diagnosis not present

## 2022-03-04 ENCOUNTER — Ambulatory Visit: Payer: Medicare HMO | Attending: Cardiology

## 2022-03-04 DIAGNOSIS — Z79899 Other long term (current) drug therapy: Secondary | ICD-10-CM | POA: Diagnosis not present

## 2022-03-04 DIAGNOSIS — E782 Mixed hyperlipidemia: Secondary | ICD-10-CM

## 2022-03-04 DIAGNOSIS — I480 Paroxysmal atrial fibrillation: Secondary | ICD-10-CM

## 2022-03-04 DIAGNOSIS — I951 Orthostatic hypotension: Secondary | ICD-10-CM | POA: Diagnosis not present

## 2022-03-04 DIAGNOSIS — I25118 Atherosclerotic heart disease of native coronary artery with other forms of angina pectoris: Secondary | ICD-10-CM | POA: Diagnosis not present

## 2022-03-04 DIAGNOSIS — I10 Essential (primary) hypertension: Secondary | ICD-10-CM

## 2022-03-04 LAB — ECHOCARDIOGRAM COMPLETE
Area-P 1/2: 3.77 cm2
S' Lateral: 2.7 cm

## 2022-03-05 ENCOUNTER — Telehealth: Payer: Self-pay

## 2022-03-05 NOTE — Telephone Encounter (Signed)
-----   Message from Richardo Priest, MD sent at 03/05/2022  2:08 PM EDT ----- This is a good result normal heart muscle function

## 2022-03-05 NOTE — Telephone Encounter (Signed)
Patient notified of results.

## 2022-03-10 DIAGNOSIS — K219 Gastro-esophageal reflux disease without esophagitis: Secondary | ICD-10-CM | POA: Diagnosis not present

## 2022-03-10 DIAGNOSIS — Z6825 Body mass index (BMI) 25.0-25.9, adult: Secondary | ICD-10-CM | POA: Diagnosis not present

## 2022-03-10 DIAGNOSIS — Z23 Encounter for immunization: Secondary | ICD-10-CM | POA: Diagnosis not present

## 2022-03-10 DIAGNOSIS — R110 Nausea: Secondary | ICD-10-CM | POA: Diagnosis not present

## 2022-03-11 DIAGNOSIS — M1712 Unilateral primary osteoarthritis, left knee: Secondary | ICD-10-CM | POA: Diagnosis not present

## 2022-03-11 DIAGNOSIS — I10 Essential (primary) hypertension: Secondary | ICD-10-CM | POA: Diagnosis not present

## 2022-03-11 DIAGNOSIS — I480 Paroxysmal atrial fibrillation: Secondary | ICD-10-CM | POA: Diagnosis not present

## 2022-03-26 ENCOUNTER — Telehealth: Payer: Self-pay | Admitting: Cardiology

## 2022-03-26 NOTE — Telephone Encounter (Signed)
  Pt is calling back regarding her heart monitor result she needs clarification

## 2022-03-26 NOTE — Telephone Encounter (Signed)
Spoke with pt and reviewed monitor results and recommendation per Dr Bettina Gavia.  Pt states she is not very good with technology and does not have a smart phone.  Pt states she has had some mild fluttering again today but has been unable to check her heart rate.  She denies current CP, SOB or dizziness.  Pt advised to keep a log and continue to monitor her HR and she should discuss again with Dr Bettina Gavia at her appointment on 04/19/2022.  Reviewed ED precautions.  Pt verbalizes understanding and agrees with current plan.

## 2022-03-30 DIAGNOSIS — M533 Sacrococcygeal disorders, not elsewhere classified: Secondary | ICD-10-CM | POA: Diagnosis not present

## 2022-04-18 NOTE — Progress Notes (Unsigned)
Cardiology Office Note:    Date:  04/19/2022   ID:  Heather Mccoy, DOB January 17, 1946, MRN [id number]  PCP:  Elenore Paddy, NP  Cardiologist:  Shirlee More, MD    Referring MD: Elenore Paddy, NP    ASSESSMENT:    1. PAF (paroxysmal atrial fibrillation) ([place])   2. High risk medication use   3. Mild CAD   4. Essential hypertension   5. Mixed hyperlipidemia   6. Orthostatic hypotension    PLAN:    In order of problems listed above:  Stable continue current antiarrhythmic drug low-dose flecainide not anticoagulated Stable CAD having no anginal discomfort she has reproducible chest wall pain we will continue her current nonsteroidal anti-inflammatory drug.  For CAD continue aspirin and statin Stable blood pressure on midodrine for BP support Continue her statin   Next appointment: 6 months   Medication Adjustments/Labs and Tests Ordered: Current medicines are reviewed at length with the patient today.  Concerns regarding medicines are outlined above.  No orders of the defined types were placed in this encounter.  No orders of the defined types were placed in this encounter.   Chief Complaint  Patient presents with   Follow-up   Atrial Fibrillation    History of Present Illness:    Heather Mccoy is a 76 y.o. female with a hx of atrial fibrillation maintained on flecainide without anticoagulation coronary artery disease hypertension hyperlipidemia and orthostatic hypotension requiring blood pressure support with midodrine last seen 02/25/2022.She had a cardiac CTA January 2021  severely elevated calcium score 345/85 and moderate CAD 50 to 69% and  mid right coronary artery 25 to 49% proximal LAD.  Last seen she was having episodes of probable recurrence of her atrial fibrillation.  Monitor report 03/23/2022 showed rare ventricular and supraventricular ectopy and brief episodes of atrial tachycardia.  Her echocardiogram 03/04/2022 showed normal left ventricular  ejection fraction 60 to 65% with mild LVH.  Right ventricle is normal in size function and pulmonary artery pressure.  There is mild mitral regurgitation of both the left and right atria were normal in size.  Compliance with diet, lifestyle and medications: Yes  At nighttime she has episodes of sharp localized sternal pain.  Not anginal in nature.  No severe or persistent rapid heart rhythm.  I reviewed the results of her testing with her that is reassuring.  I advised her to purchase a smart watch [employer] to pair with her phone to monitor for recurrent atrial fibrillation and for now remain on flecainide therapy. She is not having exertional angina edema shortness of breath or syncope Past Medical History:  Diagnosis Date   Anxiety    Aortic valve sclerosis    Arthritis    Bell's palsy 07/31/2019   Cancer (H[facility])    colon   Chronic diarrhea of unknown origin 01/12/2012   COPD (chronic obstructive pulmonary disease) (HCC)    Coronary artery disease of native artery of native heart with stable angina pectoris ([place]) 06/11/2019   Dizziness 09/18/2019   Dysrhythmia    Epigastric pain    Essential hypertension 06/11/2019   Hyperlipemia 01/19/2019   Hyperlipidemia    Hypertension    Hypotension    Leukocytosis 01/18/2022   Mixed hyperlipidemia 06/11/2019   Mood disorder ([facility])    Nausea and vomiting    Normocytic anemia 09/12/2014   Orthostatic dizziness 03/03/2020   Orthostatic hypotension    Paroxysmal atrial tachycardia 06/11/2019   Polypharmacy    Shortness of breath  Type II diabetes mellitus ([place]) 01/19/2019   Unsteady gait    Vitamin B12 deficiency 09/13/2014   Weight loss 09/12/2014    Past Surgical History:  Procedure Laterality Date   APPENDECTOMY     BACK SURGERY     CHOLECYSTECTOMY     COLON SURGERY     COLONOSCOPY  10/25/2018   Dr Orlena Sheldon Normal post-operative colon   ESOPHAGOGASTRODUODENOSCOPY  10/25/2018   Normal EGD to second portion of duodenum. Dysphagia  secondary to motility disorder, same as in 2016   EUS  01/12/2012   Procedure: UPPER ENDOSCOPIC ULTRASOUND (EUS) RADIAL;  Surgeon: Arta Silence, MD;  Location: [facility];  Service: Endoscopy;  Laterality: N/A;  to be admitted after   HAND SURGERY     REPLACEMENT TOTAL KNEE      Current Medications: Current Meds  Medication Sig   alendronate (FOSAMAX) 70 MG tablet Take 70 mg by mouth every Sunday. Take with a full glass of water on an empty stomach.   aspirin EC 81 MG tablet Take 81 mg by mouth daily.   atorvastatin (LIPITOR) 40 MG tablet Take 40 mg by mouth daily.   citalopram (CELEXA) 10 MG tablet Take 10 mg by mouth daily.   diclofenac Sodium (VOLTAREN) 1 % GEL Apply 1 Application topically as needed for pain.   dicyclomine (BENTYL) 20 MG tablet Take 20 mg by mouth 3 (three) times daily.   diphenoxylate-atropine (LOMOTIL) 2.5-0.025 MG tablet Take 2 tablets by mouth 4 (four) times daily.   famotidine (PEPCID) 40 MG tablet Take 40 mg by mouth 2 (two) times daily.   FEROSUL 325 (65 Fe) MG tablet Take 325 mg by mouth daily.   flecainide (TAMBOCOR) 50 MG tablet Take 1 tablet (50 mg total) by mouth every 12 (twelve) hours.   furosemide (LASIX) 20 MG tablet Take 1 tablet (20 mg total) by mouth daily.   loperamide (IMODIUM A-D) 2 MG tablet Take 2 mg by mouth 4 (four) times daily as needed for diarrhea or loose stools.   magnesium chloride (SLOW-MAG) 64 MG TBEC SR tablet Take 1 tablet (64 mg total) by mouth 2 (two) times daily.   meclizine (ANTIVERT) 25 MG tablet Take 25 mg by mouth 3 (three) times daily as needed for dizziness.   meloxicam (MOBIC) 7.5 MG tablet Take 7.5 mg by mouth 2 (two) times daily.   methocarbamol (ROBAXIN) 750 MG tablet Take 750 mg by mouth 4 (four) times daily.   metoprolol succinate (TOPROL XL) 25 MG 24 hr tablet Take 1 tablet (25 mg total) by mouth daily.   midodrine (PROAMATINE) 5 MG tablet Take 5 mg by mouth 3 (three) times daily with meals.   montelukast  (SINGULAIR) 10 MG tablet TAKE 1 TABLET BY MOUTH AT BEDTIME   neomycin-polymyxin-hydrocortisone (CORTISPORIN) OTIC solution Apply 2-3 drops to the ingrown toenail site twice daily. Cover with band-aid.   nystatin cream (MYCOSTATIN) Apply 1 Application topically 2 (two) times daily.   ondansetron (ZOFRAN) 4 MG tablet Take 4 mg by mouth 2 (two) times daily as needed for nausea.   pantoprazole (PROTONIX) 40 MG tablet Take 40 mg by mouth daily.   potassium chloride SA (KLOR-CON) 20 MEQ tablet Take 20 mEq by mouth daily.   predniSONE (DELTASONE) 50 MG tablet Take 50 mg by mouth daily with breakfast.   traZODone (DESYREL) 50 MG tablet Take 50 mg by mouth at bedtime.   Vibegron (GEMTESA) 75 MG TABS Take 75 mg by mouth daily.  Allergies:   Codeine, Morphine, Nitroglycerin, Tylenol [acetaminophen], and Oxycodone   Social History   Socioeconomic History   Marital status: Widowed    Spouse name: Not on file   Number of children: 1   Years of education: 10   Highest education level: Not on file  Occupational History   Occupation: RETIRED [employer]  Tobacco Use   Smoking status: Some Days    Packs/day: 0.50    Types: Cigarettes    Last attempt to quit: 04/30/2020    Years since quitting: 1.9    Passive exposure: Past   Smokeless tobacco: Never  Vaping Use   Vaping Use: Never used  Substance and Sexual Activity   Alcohol use: No   Drug use: No   Sexual activity: Never  Other Topics Concern   Not on file  Social History Narrative   Not on file   Social Determinants of Health   Financial Resource Strain: Not on file  Food Insecurity: Not on file  Transportation Needs: Not on file  Physical Activity: Not on file  Stress: Not on file  Social Connections: Not on file     Family History: The patient's family history includes CVA in her father; Cerebral aneurysm in her mother; Heart attack in her father and sister. ROS:   Please see the history of present illness.    All other  systems reviewed and are negative.  EKGs/Labs/Other Studies Reviewed:    The following studies were reviewed today:  EKG:  EKG ordered today and personally reviewed.  The ekg ordered today demonstrates sinus rhythm right bundle branch block 1 APC  Recent Labs: 12/28/2021: ALT 25; BUN 21; Creatinine 0.8; Hemoglobin 12.8; Platelets 219; Potassium 4.3; Sodium 141  Recent Lipid Panel    Component Value Date/Time   CHOL 101 11/23/2021 0935   TRIG 138 11/23/2021 0935   HDL 38 (L) 11/23/2021 0935   CHOLHDL 2.7 11/23/2021 0935   CHOLHDL 1.9 03/03/2020 0436   VLDL 14 03/03/2020 0436   LDLCALC 39 11/23/2021 0935    Physical Exam:    VS:  BP 138/74 (BP Location: Right Arm, Patient Position: Sitting, Cuff Size: Small)   Pulse 69   Ht '5\' 2"'$  (1.575 m)   Wt 138 lb (62.6 kg)   SpO2 96%   BMI 25.24 kg/m     Wt Readings from Last 3 Encounters:  04/19/22 138 lb (62.6 kg)  02/25/22 135 lb (61.2 kg)  12/28/21 140 lb 14.4 oz (63.9 kg)     GEN:  Well nourished, well developed in no acute distress HEENT: Normal NECK: No JVD; No carotid bruits LYMPHATICS: No lymphadenopathy CARDIAC: RRR, no murmurs, rubs, gallops RESPIRATORY:  Clear to auscultation without rales, wheezing or rhonchi  ABDOMEN: Soft, non-tender, non-distended MUSCULOSKELETAL:  No edema; No deformity  SKIN: Warm and dry NEUROLOGIC:  Alert and oriented x 3 PSYCHIATRIC:  Normal affect    Signed, Shirlee More, MD  04/19/2022 3:12 PM    [facility]

## 2022-04-19 ENCOUNTER — Ambulatory Visit: Payer: Medicare HMO | Attending: Cardiology | Admitting: Cardiology

## 2022-04-19 ENCOUNTER — Encounter: Payer: Self-pay | Admitting: Cardiology

## 2022-04-19 VITALS — BP 138/74 | HR 69 | Ht 62.0 in | Wt 138.0 lb

## 2022-04-19 DIAGNOSIS — E782 Mixed hyperlipidemia: Secondary | ICD-10-CM | POA: Diagnosis not present

## 2022-04-19 DIAGNOSIS — I2510 Atherosclerotic heart disease of native coronary artery without angina pectoris: Secondary | ICD-10-CM

## 2022-04-19 DIAGNOSIS — I951 Orthostatic hypotension: Secondary | ICD-10-CM

## 2022-04-19 DIAGNOSIS — I480 Paroxysmal atrial fibrillation: Secondary | ICD-10-CM

## 2022-04-19 DIAGNOSIS — I10 Essential (primary) hypertension: Secondary | ICD-10-CM

## 2022-04-19 DIAGNOSIS — Z79899 Other long term (current) drug therapy: Secondary | ICD-10-CM | POA: Diagnosis not present

## 2022-04-19 MED ORDER — MAGNESIUM CHLORIDE 64 MG PO TBEC: 1.0000 | DELAYED_RELEASE_TABLET | Freq: Two times a day (BID) | ORAL | 3 refills | Status: DC

## 2022-04-19 NOTE — Patient Instructions (Signed)
Medication Instructions:  Your physician recommends that you continue on your current medications as directed. Please refer to the Current Medication list given to you today.  *If you need a refill on your cardiac medications before your next appointment, please call your pharmacy*   Lab Work: None If you have labs (blood work) drawn today and your tests are completely normal, you will receive your results only by: [place] (if you have MyChart) OR A paper copy in the mail If you have any lab test that is abnormal or we need to change your treatment, we will call you to review the results.   Testing/Procedures: None   Follow-Up: At [facility], you and your health needs are our priority.  As part of our continuing mission to provide you with exceptional heart care, we have created designated Provider Care Teams.  These Care Teams include your primary Cardiologist (physician) and Advanced Practice Providers (APPs -  Physician Assistants and Nurse Practitioners) who all work together to provide you with the care you need, when you need it.  We recommend signing up for the patient portal called "MyChart".  Sign up information is provided on this After Visit Summary.  MyChart is used to connect with patients for Virtual Visits (Telemedicine).  Patients are able to view lab/test results, encounter notes, upcoming appointments, etc.  Non-urgent messages can be sent to your provider as well.   To learn more about what you can do with MyChart, go to [email or web address].    Your next appointment:   6 month(s)  The format for your next appointment:   In Person  Provider:   Shirlee More, MD    Other Instructions Purchase a Samsung watch to monitor for AFib  Important Information About [place]

## 2022-04-26 ENCOUNTER — Ambulatory Visit (INDEPENDENT_AMBULATORY_CARE_PROVIDER_SITE_OTHER): Payer: Medicare HMO | Admitting: Podiatry

## 2022-04-26 DIAGNOSIS — B351 Tinea unguium: Secondary | ICD-10-CM

## 2022-04-26 DIAGNOSIS — L600 Ingrowing nail: Secondary | ICD-10-CM

## 2022-04-26 DIAGNOSIS — M79609 Pain in unspecified limb: Secondary | ICD-10-CM | POA: Diagnosis not present

## 2022-04-26 NOTE — Patient Instructions (Signed)

## 2022-04-26 NOTE — Progress Notes (Signed)
  Subjective:  Patient ID: Heather Mccoy, female    DOB: 1945/12/22,  MRN: [id number]  Chief Complaint  Patient presents with   Nail Problem    Routine Foot Care     76 y.o. female presents with the above complaint. History confirmed with patient.  Patient presents for routine foot care including nail trim.  She is unable to trim her nails due to back surgery that she had and cannot bend over to get her nails.  She also reports having had both hallux nails removed completely.  The right and left hallux nail is still bothering her with any pain on the residual nails that seems to have grown back and is being painful.  She denies drainage or redness.   Objective:  Physical Exam: warm, good capillary refill, nail exam onychomycosis of the toenails with pain and ingrown nail at lateral border of the right hallux as well as lateral border of the left hallux, no erythema or drainage but pain with palpation, no trophic changes or ulcerative lesions. DP pulses palpable, PT pulses palpable, and protective sensation intact Left Foot: normal exam, no swelling, tenderness, instability; ligaments intact, full range of motion of all ankle/foot joints  Right Foot: normal exam, no swelling, tenderness, instability; ligaments intact, full range of motion of all ankle/foot joints   No images are attached to the encounter.  Assessment:   1. Ingrown nail of great toe of right foot   2. Ingrown nail of great toe of left foot   3. Pain due to onychomycosis of nail       Plan:  Patient was evaluated and treated and all questions answered.  Ingrown Nail, bilaterally -Patient elects to proceed with minor surgery to remove ingrown toenail today. Consent reviewed and signed by patient. -Ingrown nail excised. See procedure note. -Educated on post-procedure care including soaking. Written instructions provided and reviewed. -Patient to follow up in 2 weeks for nail check.  Procedure: Excision of Ingrown  Toenail Location: Right hallux bilateral borders, left hallux lateral border. Anesthesia: Lidocaine 1% plain; 1.5 mL and Marcaine 0.5% plain; 1.5 mL, digital block. Skin Prep: Betadine. Dressing: Silvadene; telfa; dry, sterile, compression dressing. Technique: Following skin prep, the toe was exsanguinated and a tourniquet was secured at the base of the toe. The affected nail border was freed, split with a nail splitter, and excised. Chemical matrixectomy was then performed with phenol and irrigated out with alcohol. The tourniquet was then removed and sterile dressing applied. Disposition: Patient tolerated procedure well. Patient to return in 2 weeks for follow-up.   #Onychomycosis with pain  -Nails palliatively debrided as below. -Educated on self-care  Procedure: Nail Debridement Rationale: Pain Type of Debridement: manual, sharp debridement. Instrumentation: Nail nipper, rotary burr. Number of Nails: 8   Return in about 3 weeks (around 05/17/2022) for FOllow up bilat P&A hallux nails.

## 2022-05-03 DIAGNOSIS — I480 Paroxysmal atrial fibrillation: Secondary | ICD-10-CM | POA: Diagnosis not present

## 2022-05-03 DIAGNOSIS — M1990 Unspecified osteoarthritis, unspecified site: Secondary | ICD-10-CM | POA: Diagnosis not present

## 2022-05-03 DIAGNOSIS — Z6825 Body mass index (BMI) 25.0-25.9, adult: Secondary | ICD-10-CM | POA: Diagnosis not present

## 2022-05-03 DIAGNOSIS — I10 Essential (primary) hypertension: Secondary | ICD-10-CM | POA: Diagnosis not present

## 2022-05-03 DIAGNOSIS — I951 Orthostatic hypotension: Secondary | ICD-10-CM | POA: Diagnosis not present

## 2022-05-18 ENCOUNTER — Ambulatory Visit (INDEPENDENT_AMBULATORY_CARE_PROVIDER_SITE_OTHER): Payer: Medicare HMO | Admitting: Podiatry

## 2022-05-18 DIAGNOSIS — L600 Ingrowing nail: Secondary | ICD-10-CM

## 2022-05-18 NOTE — Progress Notes (Signed)
Subjective: Heather Mccoy is a 77 y.o.  female returns to office today for follow up evaluation after having bilateral Hallux bilateral border nail ingrown removal with phenol and alcohol matrixectomy approximately 2 weeks ago. Patient has been soaking using epsom salts and applying topical antibiotic covered with bandaid daily. Patient denies fevers, chills, nausea, vomiting. Denies any calf pain, chest pain, SOB.   Objective:  Vitals: Reviewed  General: Well developed, nourished, in no acute distress, alert and oriented x3   Dermatology: Skin is warm, dry and supple bilateral. bilateral hallux nail border appears to be clean, dry, with mild granular tissue and surrounding scab. There is no surrounding erythema, edema, drainage/purulence. The remaining nails appear unremarkable at this time. There are no other lesions or other signs of infection present.  Neurovascular status: Intact. No lower extremity swelling; No pain with calf compression bilateral.  Musculoskeletal: Decreased tenderness to palpation of the bilateral hallux nail fold(s). Muscular strength within normal limits bilateral.   Assesement and Plan: S/p phenol and alcohol matrixectomy to the  bilateral hallux nail bilateral, doing well.   -Continue soaking in epsom salts twice a day followed by antibiotic ointment and a band-aid. Can leave uncovered at night. Continue this until completely healed.  -If the area has not healed in 2 weeks, call the office for follow-up appointment, or sooner if any problems arise.  -Monitor for any signs/symptoms of infection. Call the office immediately if any occur or go directly to the emergency room. Call with any questions/concerns.        Everitt Amber, DPM [facility]                   05/18/2022

## 2022-05-25 DIAGNOSIS — M5386 Other specified dorsopathies, lumbar region: Secondary | ICD-10-CM | POA: Diagnosis not present

## 2022-05-25 DIAGNOSIS — M79604 Pain in right leg: Secondary | ICD-10-CM | POA: Diagnosis not present

## 2022-05-25 DIAGNOSIS — M1990 Unspecified osteoarthritis, unspecified site: Secondary | ICD-10-CM | POA: Diagnosis not present

## 2022-05-25 DIAGNOSIS — R001 Bradycardia, unspecified: Secondary | ICD-10-CM | POA: Diagnosis not present

## 2022-05-25 DIAGNOSIS — M5126 Other intervertebral disc displacement, lumbar region: Secondary | ICD-10-CM | POA: Diagnosis not present

## 2022-05-25 DIAGNOSIS — Z885 Allergy status to narcotic agent status: Secondary | ICD-10-CM | POA: Diagnosis not present

## 2022-05-25 DIAGNOSIS — G8929 Other chronic pain: Secondary | ICD-10-CM | POA: Diagnosis not present

## 2022-05-25 DIAGNOSIS — K838 Other specified diseases of biliary tract: Secondary | ICD-10-CM | POA: Diagnosis not present

## 2022-05-25 DIAGNOSIS — R9431 Abnormal electrocardiogram [ECG] [EKG]: Secondary | ICD-10-CM | POA: Diagnosis not present

## 2022-05-25 DIAGNOSIS — R2689 Other abnormalities of gait and mobility: Secondary | ICD-10-CM | POA: Diagnosis not present

## 2022-05-25 DIAGNOSIS — D696 Thrombocytopenia, unspecified: Secondary | ICD-10-CM | POA: Diagnosis not present

## 2022-05-25 DIAGNOSIS — M545 Low back pain, unspecified: Secondary | ICD-10-CM | POA: Diagnosis not present

## 2022-05-25 DIAGNOSIS — I444 Left anterior fascicular block: Secondary | ICD-10-CM | POA: Diagnosis not present

## 2022-05-25 DIAGNOSIS — E876 Hypokalemia: Secondary | ICD-10-CM | POA: Diagnosis not present

## 2022-05-25 DIAGNOSIS — R42 Dizziness and giddiness: Secondary | ICD-10-CM | POA: Diagnosis not present

## 2022-05-25 DIAGNOSIS — N390 Urinary tract infection, site not specified: Secondary | ICD-10-CM | POA: Diagnosis not present

## 2022-05-25 DIAGNOSIS — K5909 Other constipation: Secondary | ICD-10-CM | POA: Diagnosis not present

## 2022-05-25 DIAGNOSIS — B952 Enterococcus as the cause of diseases classified elsewhere: Secondary | ICD-10-CM | POA: Diagnosis not present

## 2022-05-25 DIAGNOSIS — M7989 Other specified soft tissue disorders: Secondary | ICD-10-CM | POA: Diagnosis not present

## 2022-05-25 DIAGNOSIS — M4856XA Collapsed vertebra, not elsewhere classified, lumbar region, initial encounter for fracture: Secondary | ICD-10-CM | POA: Diagnosis not present

## 2022-05-25 DIAGNOSIS — M25552 Pain in left hip: Secondary | ICD-10-CM | POA: Diagnosis not present

## 2022-05-25 DIAGNOSIS — H8110 Benign paroxysmal vertigo, unspecified ear: Secondary | ICD-10-CM | POA: Diagnosis not present

## 2022-05-25 DIAGNOSIS — Z85038 Personal history of other malignant neoplasm of large intestine: Secondary | ICD-10-CM | POA: Diagnosis not present

## 2022-05-25 DIAGNOSIS — I4510 Unspecified right bundle-branch block: Secondary | ICD-10-CM | POA: Diagnosis not present

## 2022-05-25 DIAGNOSIS — I10 Essential (primary) hypertension: Secondary | ICD-10-CM | POA: Diagnosis not present

## 2022-05-25 DIAGNOSIS — Z8673 Personal history of transient ischemic attack (TIA), and cerebral infarction without residual deficits: Secondary | ICD-10-CM | POA: Diagnosis not present

## 2022-05-25 DIAGNOSIS — F17210 Nicotine dependence, cigarettes, uncomplicated: Secondary | ICD-10-CM | POA: Diagnosis not present

## 2022-05-25 DIAGNOSIS — K219 Gastro-esophageal reflux disease without esophagitis: Secondary | ICD-10-CM | POA: Diagnosis not present

## 2022-05-25 DIAGNOSIS — Z87440 Personal history of urinary (tract) infections: Secondary | ICD-10-CM | POA: Diagnosis not present

## 2022-05-25 DIAGNOSIS — J449 Chronic obstructive pulmonary disease, unspecified: Secondary | ICD-10-CM | POA: Diagnosis not present

## 2022-05-25 DIAGNOSIS — E7800 Pure hypercholesterolemia, unspecified: Secondary | ICD-10-CM | POA: Diagnosis not present

## 2022-05-25 DIAGNOSIS — E119 Type 2 diabetes mellitus without complications: Secondary | ICD-10-CM | POA: Diagnosis not present

## 2022-05-25 DIAGNOSIS — I252 Old myocardial infarction: Secondary | ICD-10-CM | POA: Diagnosis not present

## 2022-05-25 DIAGNOSIS — F419 Anxiety disorder, unspecified: Secondary | ICD-10-CM | POA: Diagnosis not present

## 2022-05-26 DIAGNOSIS — M4856XA Collapsed vertebra, not elsewhere classified, lumbar region, initial encounter for fracture: Secondary | ICD-10-CM | POA: Diagnosis not present

## 2022-05-26 DIAGNOSIS — N390 Urinary tract infection, site not specified: Secondary | ICD-10-CM | POA: Diagnosis not present

## 2022-05-26 DIAGNOSIS — M5386 Other specified dorsopathies, lumbar region: Secondary | ICD-10-CM | POA: Diagnosis not present

## 2022-05-27 DIAGNOSIS — M4856XA Collapsed vertebra, not elsewhere classified, lumbar region, initial encounter for fracture: Secondary | ICD-10-CM | POA: Diagnosis not present

## 2022-05-27 DIAGNOSIS — N390 Urinary tract infection, site not specified: Secondary | ICD-10-CM | POA: Diagnosis not present

## 2022-05-27 DIAGNOSIS — M5386 Other specified dorsopathies, lumbar region: Secondary | ICD-10-CM | POA: Diagnosis not present

## 2022-05-28 DIAGNOSIS — M4856XA Collapsed vertebra, not elsewhere classified, lumbar region, initial encounter for fracture: Secondary | ICD-10-CM | POA: Diagnosis not present

## 2022-05-28 DIAGNOSIS — M5386 Other specified dorsopathies, lumbar region: Secondary | ICD-10-CM | POA: Diagnosis not present

## 2022-05-28 DIAGNOSIS — N390 Urinary tract infection, site not specified: Secondary | ICD-10-CM | POA: Diagnosis not present

## 2022-05-29 DIAGNOSIS — M4856XA Collapsed vertebra, not elsewhere classified, lumbar region, initial encounter for fracture: Secondary | ICD-10-CM | POA: Diagnosis not present

## 2022-05-29 DIAGNOSIS — M7989 Other specified soft tissue disorders: Secondary | ICD-10-CM | POA: Diagnosis not present

## 2022-05-29 DIAGNOSIS — N390 Urinary tract infection, site not specified: Secondary | ICD-10-CM | POA: Diagnosis not present

## 2022-05-29 DIAGNOSIS — M5386 Other specified dorsopathies, lumbar region: Secondary | ICD-10-CM | POA: Diagnosis not present

## 2022-05-30 DIAGNOSIS — M4856XA Collapsed vertebra, not elsewhere classified, lumbar region, initial encounter for fracture: Secondary | ICD-10-CM | POA: Diagnosis not present

## 2022-05-30 DIAGNOSIS — N390 Urinary tract infection, site not specified: Secondary | ICD-10-CM | POA: Diagnosis not present

## 2022-05-30 DIAGNOSIS — M5386 Other specified dorsopathies, lumbar region: Secondary | ICD-10-CM | POA: Diagnosis not present

## 2022-05-31 DIAGNOSIS — M4856XA Collapsed vertebra, not elsewhere classified, lumbar region, initial encounter for fracture: Secondary | ICD-10-CM | POA: Diagnosis not present

## 2022-05-31 DIAGNOSIS — N390 Urinary tract infection, site not specified: Secondary | ICD-10-CM | POA: Diagnosis not present

## 2022-05-31 DIAGNOSIS — M5386 Other specified dorsopathies, lumbar region: Secondary | ICD-10-CM | POA: Diagnosis not present

## 2022-06-01 DIAGNOSIS — I444 Left anterior fascicular block: Secondary | ICD-10-CM | POA: Diagnosis not present

## 2022-06-01 DIAGNOSIS — J449 Chronic obstructive pulmonary disease, unspecified: Secondary | ICD-10-CM | POA: Diagnosis not present

## 2022-06-01 DIAGNOSIS — M5431 Sciatica, right side: Secondary | ICD-10-CM | POA: Diagnosis not present

## 2022-06-01 DIAGNOSIS — W19XXXA Unspecified fall, initial encounter: Secondary | ICD-10-CM | POA: Diagnosis not present

## 2022-06-01 DIAGNOSIS — R42 Dizziness and giddiness: Secondary | ICD-10-CM | POA: Diagnosis not present

## 2022-06-01 DIAGNOSIS — I4891 Unspecified atrial fibrillation: Secondary | ICD-10-CM | POA: Diagnosis not present

## 2022-06-01 DIAGNOSIS — I639 Cerebral infarction, unspecified: Secondary | ICD-10-CM | POA: Diagnosis not present

## 2022-06-01 DIAGNOSIS — I10 Essential (primary) hypertension: Secondary | ICD-10-CM | POA: Diagnosis not present

## 2022-06-01 DIAGNOSIS — I4510 Unspecified right bundle-branch block: Secondary | ICD-10-CM | POA: Diagnosis not present

## 2022-06-01 DIAGNOSIS — R9431 Abnormal electrocardiogram [ECG] [EKG]: Secondary | ICD-10-CM | POA: Diagnosis not present

## 2022-06-01 DIAGNOSIS — M79604 Pain in right leg: Secondary | ICD-10-CM | POA: Diagnosis not present

## 2022-06-02 DIAGNOSIS — I252 Old myocardial infarction: Secondary | ICD-10-CM | POA: Diagnosis not present

## 2022-06-02 DIAGNOSIS — F419 Anxiety disorder, unspecified: Secondary | ICD-10-CM | POA: Diagnosis not present

## 2022-06-02 DIAGNOSIS — Z885 Allergy status to narcotic agent status: Secondary | ICD-10-CM | POA: Diagnosis not present

## 2022-06-02 DIAGNOSIS — I480 Paroxysmal atrial fibrillation: Secondary | ICD-10-CM | POA: Diagnosis not present

## 2022-06-02 DIAGNOSIS — K219 Gastro-esophageal reflux disease without esophagitis: Secondary | ICD-10-CM | POA: Diagnosis not present

## 2022-06-02 DIAGNOSIS — Z7982 Long term (current) use of aspirin: Secondary | ICD-10-CM | POA: Diagnosis not present

## 2022-06-02 DIAGNOSIS — Z8701 Personal history of pneumonia (recurrent): Secondary | ICD-10-CM | POA: Diagnosis not present

## 2022-06-02 DIAGNOSIS — W19XXXA Unspecified fall, initial encounter: Secondary | ICD-10-CM | POA: Diagnosis not present

## 2022-06-02 DIAGNOSIS — R9431 Abnormal electrocardiogram [ECG] [EKG]: Secondary | ICD-10-CM | POA: Diagnosis not present

## 2022-06-02 DIAGNOSIS — J449 Chronic obstructive pulmonary disease, unspecified: Secondary | ICD-10-CM | POA: Diagnosis not present

## 2022-06-02 DIAGNOSIS — Z85038 Personal history of other malignant neoplasm of large intestine: Secondary | ICD-10-CM | POA: Diagnosis not present

## 2022-06-02 DIAGNOSIS — S0990XA Unspecified injury of head, initial encounter: Secondary | ICD-10-CM | POA: Diagnosis not present

## 2022-06-02 DIAGNOSIS — I4891 Unspecified atrial fibrillation: Secondary | ICD-10-CM | POA: Diagnosis not present

## 2022-06-02 DIAGNOSIS — I444 Left anterior fascicular block: Secondary | ICD-10-CM | POA: Diagnosis not present

## 2022-06-02 DIAGNOSIS — I639 Cerebral infarction, unspecified: Secondary | ICD-10-CM | POA: Diagnosis not present

## 2022-06-02 DIAGNOSIS — I10 Essential (primary) hypertension: Secondary | ICD-10-CM | POA: Diagnosis not present

## 2022-06-02 DIAGNOSIS — Z043 Encounter for examination and observation following other accident: Secondary | ICD-10-CM | POA: Diagnosis not present

## 2022-06-02 DIAGNOSIS — Z792 Long term (current) use of antibiotics: Secondary | ICD-10-CM | POA: Diagnosis not present

## 2022-06-02 DIAGNOSIS — Z79899 Other long term (current) drug therapy: Secondary | ICD-10-CM | POA: Diagnosis not present

## 2022-06-02 DIAGNOSIS — I4510 Unspecified right bundle-branch block: Secondary | ICD-10-CM | POA: Diagnosis not present

## 2022-06-02 DIAGNOSIS — Z7952 Long term (current) use of systemic steroids: Secondary | ICD-10-CM | POA: Diagnosis not present

## 2022-06-02 DIAGNOSIS — M1710 Unilateral primary osteoarthritis, unspecified knee: Secondary | ICD-10-CM | POA: Diagnosis not present

## 2022-06-02 DIAGNOSIS — E7800 Pure hypercholesterolemia, unspecified: Secondary | ICD-10-CM | POA: Diagnosis not present

## 2022-06-02 DIAGNOSIS — M5431 Sciatica, right side: Secondary | ICD-10-CM | POA: Diagnosis not present

## 2022-06-02 DIAGNOSIS — Z87440 Personal history of urinary (tract) infections: Secondary | ICD-10-CM | POA: Diagnosis not present

## 2022-06-02 DIAGNOSIS — Z8673 Personal history of transient ischemic attack (TIA), and cerebral infarction without residual deficits: Secondary | ICD-10-CM | POA: Diagnosis not present

## 2022-06-02 DIAGNOSIS — Z888 Allergy status to other drugs, medicaments and biological substances status: Secondary | ICD-10-CM | POA: Diagnosis not present

## 2022-06-02 DIAGNOSIS — W01198A Fall on same level from slipping, tripping and stumbling with subsequent striking against other object, initial encounter: Secondary | ICD-10-CM | POA: Diagnosis not present

## 2022-06-02 DIAGNOSIS — M25559 Pain in unspecified hip: Secondary | ICD-10-CM | POA: Diagnosis not present

## 2022-06-03 DIAGNOSIS — I10 Essential (primary) hypertension: Secondary | ICD-10-CM | POA: Diagnosis not present

## 2022-06-03 DIAGNOSIS — M5431 Sciatica, right side: Secondary | ICD-10-CM | POA: Diagnosis not present

## 2022-06-03 DIAGNOSIS — I361 Nonrheumatic tricuspid (valve) insufficiency: Secondary | ICD-10-CM | POA: Diagnosis not present

## 2022-06-03 DIAGNOSIS — I480 Paroxysmal atrial fibrillation: Secondary | ICD-10-CM | POA: Diagnosis not present

## 2022-06-08 DIAGNOSIS — M533 Sacrococcygeal disorders, not elsewhere classified: Secondary | ICD-10-CM | POA: Diagnosis not present

## 2022-06-08 DIAGNOSIS — S32030D Wedge compression fracture of third lumbar vertebra, subsequent encounter for fracture with routine healing: Secondary | ICD-10-CM | POA: Diagnosis not present

## 2022-06-08 DIAGNOSIS — R200 Anesthesia of skin: Secondary | ICD-10-CM | POA: Diagnosis not present

## 2022-06-11 DIAGNOSIS — S32030A Wedge compression fracture of third lumbar vertebra, initial encounter for closed fracture: Secondary | ICD-10-CM | POA: Diagnosis not present

## 2022-06-11 DIAGNOSIS — M25561 Pain in right knee: Secondary | ICD-10-CM | POA: Diagnosis not present

## 2022-06-11 DIAGNOSIS — M25511 Pain in right shoulder: Secondary | ICD-10-CM | POA: Diagnosis not present

## 2022-06-11 DIAGNOSIS — Z96651 Presence of right artificial knee joint: Secondary | ICD-10-CM | POA: Diagnosis not present

## 2022-06-12 NOTE — Progress Notes (Unsigned)
Cardiology Office Note:    Date:  06/14/2022   ID:  Heather Mccoy, DOB 06-Mar-1946, MRN [id number]  PCP:  Elenore Paddy, NP  Cardiologist:  Shirlee More, MD    Referring MD: Elenore Paddy, NP    ASSESSMENT:    1. Orthostatic hypotension   2. PAF (paroxysmal atrial fibrillation) ([place])   3. High risk medication use   4. Essential hypertension   5. Mixed hyperlipidemia   6. Coronary artery disease of native artery of native heart with stable angina pectoris (HCC)    PLAN:    In order of problems listed above:  Her predominant problem clearly is symptomatic hypotension and overwhelming concerned about safety in the home incompetency with her medications although she had atrial fibrillation she was asymptomatic it was brief and clearly I would not anticoagulate her and should be kept on low-dose flecainide and minimal dose of beta-blocker. Discontinue her diuretic avoid antihypertensive agents Increase midodrine but I am uncertain whether or not she is taking anti-SCL man to go to the house get her pillbox and to load the medications and try to supervise them at home She will continue taking her statin Stable CAD having no angina I have asked him to make an appointment as quickly as it can with her primary care physician to address the issues of safety in the home supervision of medications and follow-up for symptomatic orthostatic hypotension.   Next appointment: 6 months   Medication Adjustments/Labs and Tests Ordered: Current medicines are reviewed at length with the patient today.  Concerns regarding medicines are outlined above.  No orders of the defined types were placed in this encounter.  No orders of the defined types were placed in this encounter.   Chief Complaint  Patient presents with   Follow-up   Atrial Fibrillation    History of Present Illness:    Heather Mccoy is a 77 y.o. female with a hx of paroxysmal atrial fibrillation with flecai medic  orthostatic hypotension requiring midodrine and Florinef therapy.  Nide therapy and not anticoagulated hypertension hyperlipidemia orthostatic hypotension and mild CAD last seen 04/19/2022.  She had symptoms  She had a cardiac CTA January 2021  severely elevated calcium score 345/85 and moderate CAD 50 to 69% and  mid right coronary artery 25 to 49% proximal LAD.  Monitor report 03/23/2022 showed rare ventricular and supraventricular ectopy and brief episodes of atrial tachycardia.   Her echocardiogram 03/04/2022 showed normal left ventricular ejection fraction 60 to 65% with mild LVH.  Right ventricle is normal in size function and pulmonary artery pressure.  There is mild mitral regurgitation of both the left and right atria were normal in size.  Unfortunately she had another echocardiogram performed at [facility] 06/03/2022 showing normal ejection fraction left atrium was felt to be moderately enlarged and there is mild tricuspid regurgitation.  I am reviewing that report left atrial volume index was 52.  Compliance with diet, lifestyle and medications: No current medications or not supervised     She was seen at [facility] 06/02/2022 and discharged the next calendar day.  Her discharge diagnosis was a fall and atrial fibrillation.  There is a notation she presented with atrial fibrillation rapid rate and quickly recovered converted to sinus rhythm she was observed overnight and was not seen by cardiology while in the hospital and was discharged to follow-up with cardiology laboratory studies showed normal hemoglobin 12.2 creatinine 0.5 she was hypokalemic her potassium was 3.0 proBNP level  mildly elevated at 1210 troponin was undetectable does not appear she has thyroid studies performed.  Venous duplex upper extremity normal bilaterally chest x-ray showed no acute cardiopulmonary disease EKG #1 in the emergency room showed atrial fibrillation rapid rate of 155 bpm EKG #2 showed sinus rhythm  right bundle branch block CT of the chest was performed with no acute disease or pulmonary embolism.  This is quite a visit to make sure man who watches him for her and brought her to the office and tells me in the last week or 2 she is fallen 8 times and she has had multiple ED interactions She complains of being dizzy unsteady and her symptoms are clearly postural Standing her blood pressure is 11-914 systolic and she had to be supported Previously her diuretic was stopped she is back on it and cannot tell me why She was unaware of atrial fibrillation and I think overwhelmingly she is a prohibitive risk for anticoagulation at this time Her medications are not supervised Her daughter who lives close to [facility] is looking for assisted living  Past Medical History:  Diagnosis Date   Anxiety    Aortic valve sclerosis    Arthritis    Bell's palsy 07/31/2019   Cancer ([place])    colon   Chronic diarrhea of unknown origin 01/12/2012   COPD (chronic obstructive pulmonary disease) ([place])    Coronary artery disease of native artery of native heart with stable angina pectoris ([place]) 06/11/2019   Dizziness 09/18/2019   Dysrhythmia    Epigastric pain    Essential hypertension 06/11/2019   Hyperlipemia 01/19/2019   Hyperlipidemia    Hypertension    Hypotension    Leukocytosis 01/18/2022   Mixed hyperlipidemia 06/11/2019   Mood disorder (HCC)    Nausea and vomiting    Normocytic anemia 09/12/2014   Orthostatic dizziness 03/03/2020   Orthostatic hypotension    Paroxysmal atrial tachycardia 06/11/2019   Polypharmacy    Shortness of breath    Type II diabetes mellitus ([place]) 01/19/2019   Unsteady gait    Vitamin B12 deficiency 09/13/2014   Weight loss 09/12/2014    Past Surgical History:  Procedure Laterality Date   APPENDECTOMY     BACK SURGERY     CHOLECYSTECTOMY     COLON SURGERY     COLONOSCOPY  10/25/2018   Dr Orlena Sheldon Normal post-operative colon   ESOPHAGOGASTRODUODENOSCOPY   10/25/2018   Normal EGD to second portion of duodenum. 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 Take with a full glass of water on an empty stomach.   aspirin EC 81 MG tablet Take 81 mg by mouth daily.   atorvastatin (LIPITOR) 40 MG tablet Take 40 mg by mouth daily.   citalopram (CELEXA) 10 MG tablet Take 10 mg by mouth daily.   diclofenac Sodium (VOLTAREN) 1 % GEL Apply 1 Application topically as needed for pain.   dicyclomine (BENTYL) 20 MG tablet Take 20 mg by mouth 3 (three) times daily.   diphenoxylate-atropine (LOMOTIL) 2.5-0.025 MG tablet Take 2 tablets by mouth 4 (four) times daily.  famotidine (PEPCID) 40 MG tablet Take 40 mg by mouth 2 (two) times daily.   FEROSUL 325 (65 Fe) MG tablet Take 325 mg by mouth daily.   flecainide (TAMBOCOR) 50 MG tablet Take 1 tablet (50 mg total) by mouth every 12 (twelve) hours.   furosemide (LASIX) 20 MG tablet Take 1 tablet (20 mg total) by mouth daily.   loperamide (IMODIUM A-D) 2 MG tablet Take 2 mg by mouth 4 (four) times daily as needed for diarrhea or loose stools.   magnesium chloride (SLOW-MAG) 64 MG TBEC SR tablet Take 1 tablet (64 mg total) by mouth 2 (two) times daily.   meclizine (ANTIVERT) 25 MG tablet Take 25 mg by mouth 3 (three) times daily as needed for dizziness.   meloxicam (MOBIC) 7.5 MG tablet Take 7.5 mg by mouth 2 (two) times daily.   methocarbamol (ROBAXIN) 750 MG tablet Take 750 mg by mouth 4 (four) times daily.   metoprolol succinate (TOPROL XL) 25 MG 24 hr tablet Take 1 tablet (25 mg total) by mouth daily.   midodrine (PROAMATINE) 5 MG tablet Take 5 mg  by mouth 3 (three) times daily with meals.   montelukast (SINGULAIR) 10 MG tablet TAKE 1 TABLET BY MOUTH AT BEDTIME   neomycin-polymyxin-hydrocortisone (CORTISPORIN) OTIC solution Apply 2-3 drops to the ingrown toenail site twice daily. Cover with band-aid.   nystatin cream (MYCOSTATIN) Apply 1 Application topically 2 (two) times daily.   ondansetron (ZOFRAN) 4 MG tablet Take 4 mg by mouth 2 (two) times daily as needed for nausea.   pantoprazole (PROTONIX) 40 MG tablet Take 40 mg by mouth daily.   potassium chloride SA (KLOR-CON) 20 MEQ tablet Take 20 mEq by mouth daily.   predniSONE (DELTASONE) 50 MG tablet Take 50 mg by mouth daily with breakfast.   traZODone (DESYREL) 50 MG tablet Take 50 mg by mouth at bedtime.   Vibegron (GEMTESA) 75 MG TABS Take 75 mg by mouth daily.     Allergies:   Codeine, Morphine, Nitroglycerin, Tylenol [acetaminophen], and Oxycodone   Social History   Socioeconomic History   Marital status: Widowed    Spouse name: Not on file   Number of children: 1   Years of education: 10   Highest education level: Not on file  Occupational History   Occupation: RETIRED [employer]  Tobacco Use   Smoking status: Some Days    Packs/day: 0.50    Types: Cigarettes    Last attempt to quit: 04/30/2020    Years since quitting: 2.1    Passive exposure: Past   Smokeless tobacco: Never  Vaping Use   Vaping Use: Never used  Substance and Sexual Activity   Alcohol use: No   Drug use: No   Sexual activity: Never  Other Topics Concern   Not on file  Social History Narrative   Not on file   Social Determinants of Health   Financial Resource Strain: Not on file  Food Insecurity: Not on file  Transportation Needs: Not on file  Physical Activity: Not on file  Stress: Not on file  Social Connections: Not on file     Family History: The patient's family history includes CVA in her father; Cerebral aneurysm in her mother; Heart attack in her father and sister. ROS:    Please see the history of present illness.    All other systems reviewed and are negative.  EKGs/Labs/Other Studies Reviewed:    The following studies were reviewed today:  EKG:  EKG  ordered today and personally reviewed.  The ekg ordered today demonstrates sinus rhythm poor R wave progression left axis deviation  Recent Labs: 12/28/2021: ALT 25; BUN 21; Creatinine 0.8; Hemoglobin 12.8; Platelets 219; Potassium 4.3; Sodium 141  Recent Lipid Panel    Component Value Date/Time   CHOL 101 11/23/2021 0935   TRIG 138 11/23/2021 0935   HDL 38 (L) 11/23/2021 0935   CHOLHDL 2.7 11/23/2021 0935   CHOLHDL 1.9 03/03/2020 0436   VLDL 14 03/03/2020 0436   LDLCALC 39 11/23/2021 0935    Physical Exam:    VS:  BP 128/80 (BP Location: Left Arm, Patient Position: Sitting)   Pulse 64   Ht '5\' 2"'$  (1.575 m)   Wt 141 lb (64 kg)   SpO2 99%   BMI 25.79 kg/m     Wt Readings from Last 3 Encounters:  06/14/22 141 lb (64 kg)  04/19/22 138 lb (62.6 kg)  02/25/22 135 lb (61.2 kg)     GEN: She appears quite frail weak in no acute distress HEENT: Normal NECK: No JVD; No carotid bruits LYMPHATICS: No lymphadenopathy CARDIAC: RRR, no murmurs, rubs, gallops RESPIRATORY:  Clear to auscultation without rales, wheezing or rhonchi  ABDOMEN: Soft, non-tender, non-distended MUSCULOSKELETAL:  No edema; No deformity  SKIN: Warm and dry NEUROLOGIC:  Alert and oriented x 3 PSYCHIATRIC:  Normal affect    Signed, Shirlee More, MD  06/14/2022 1:07 PM    [place]

## 2022-06-14 ENCOUNTER — Ambulatory Visit: Payer: Medicare HMO | Attending: Cardiology | Admitting: Cardiology

## 2022-06-14 ENCOUNTER — Encounter: Payer: Self-pay | Admitting: Cardiology

## 2022-06-14 VITALS — BP 128/80 | HR 64 | Ht 62.0 in | Wt 141.0 lb

## 2022-06-14 DIAGNOSIS — I10 Essential (primary) hypertension: Secondary | ICD-10-CM

## 2022-06-14 DIAGNOSIS — E782 Mixed hyperlipidemia: Secondary | ICD-10-CM

## 2022-06-14 DIAGNOSIS — I25118 Atherosclerotic heart disease of native coronary artery with other forms of angina pectoris: Secondary | ICD-10-CM | POA: Diagnosis not present

## 2022-06-14 DIAGNOSIS — Z79899 Other long term (current) drug therapy: Secondary | ICD-10-CM

## 2022-06-14 DIAGNOSIS — I480 Paroxysmal atrial fibrillation: Secondary | ICD-10-CM

## 2022-06-14 DIAGNOSIS — I951 Orthostatic hypotension: Secondary | ICD-10-CM | POA: Diagnosis not present

## 2022-06-14 MED ORDER — MIDODRINE HCL 10 MG PO TABS: 10.0000 mg | ORAL_TABLET | Freq: Three times a day (TID) | ORAL | 5 refills | Status: DC

## 2022-06-14 NOTE — Addendum Note (Signed)
Addended by: Jerl Santos R on: 06/14/2022 01:17 PM   Modules accepted: Orders

## 2022-06-14 NOTE — Patient Instructions (Signed)
Medication Instructions:  Your physician has recommended you make the following change in your medication:  Stop taking Furosemide Increase Midodrine 10 mg three times daily F/U with Primary Care Doctor regarding medications  *If you need a refill on your cardiac medications before your next appointment, please call your pharmacy*   Lab Work: NONE If you have labs (blood work) drawn today and your tests are completely normal, you will receive your results only by: [employer] (if you have MyChart) OR A paper copy in the mail If you have any lab test that is abnormal or we need to change your treatment, we will call you to review the results.   Testing/Procedures NONE   Follow-Up: At [facility], you and your health needs are our priority.  As part of our continuing mission to provide you with exceptional heart care, we have created designated Provider Care Teams.  These Care Teams include your primary Cardiologist (physician) and Advanced Practice Providers (APPs -  Physician Assistants and Nurse Practitioners) who all work together to provide you with the care you need, when you need it.  We recommend signing up for the patient portal called "MyChart".  Sign up information is provided on this After Visit Summary.  MyChart is used to connect with patients for Virtual Visits (Telemedicine).  Patients are able to view lab/test results, encounter notes, upcoming appointments, etc.  Non-urgent messages can be sent to your provider as well.   To learn more about what you can do with MyChart, go to [email or web address].    Your next appointment:   6 month(s)  Provider:   Shirlee More, MD    Other Instructions

## 2022-06-16 DIAGNOSIS — N390 Urinary tract infection, site not specified: Secondary | ICD-10-CM | POA: Diagnosis not present

## 2022-06-16 DIAGNOSIS — M47817 Spondylosis without myelopathy or radiculopathy, lumbosacral region: Secondary | ICD-10-CM | POA: Diagnosis not present

## 2022-06-16 DIAGNOSIS — E876 Hypokalemia: Secondary | ICD-10-CM | POA: Diagnosis not present

## 2022-06-16 DIAGNOSIS — D500 Iron deficiency anemia secondary to blood loss (chronic): Secondary | ICD-10-CM | POA: Diagnosis not present

## 2022-06-16 DIAGNOSIS — N3946 Mixed incontinence: Secondary | ICD-10-CM | POA: Diagnosis not present

## 2022-06-16 DIAGNOSIS — M8008XD Age-related osteoporosis with current pathological fracture, vertebra(e), subsequent encounter for fracture with routine healing: Secondary | ICD-10-CM | POA: Diagnosis not present

## 2022-06-16 DIAGNOSIS — E7800 Pure hypercholesterolemia, unspecified: Secondary | ICD-10-CM | POA: Diagnosis not present

## 2022-06-16 DIAGNOSIS — G9581 Conus medullaris syndrome: Secondary | ICD-10-CM | POA: Diagnosis not present

## 2022-06-16 DIAGNOSIS — I951 Orthostatic hypotension: Secondary | ICD-10-CM | POA: Diagnosis not present

## 2022-06-16 DIAGNOSIS — J449 Chronic obstructive pulmonary disease, unspecified: Secondary | ICD-10-CM | POA: Diagnosis not present

## 2022-06-16 DIAGNOSIS — H8110 Benign paroxysmal vertigo, unspecified ear: Secondary | ICD-10-CM | POA: Diagnosis not present

## 2022-06-16 DIAGNOSIS — I480 Paroxysmal atrial fibrillation: Secondary | ICD-10-CM | POA: Diagnosis not present

## 2022-06-16 DIAGNOSIS — F418 Other specified anxiety disorders: Secondary | ICD-10-CM | POA: Diagnosis not present

## 2022-06-16 DIAGNOSIS — K219 Gastro-esophageal reflux disease without esophagitis: Secondary | ICD-10-CM | POA: Diagnosis not present

## 2022-06-16 DIAGNOSIS — I2510 Atherosclerotic heart disease of native coronary artery without angina pectoris: Secondary | ICD-10-CM | POA: Diagnosis not present

## 2022-06-16 DIAGNOSIS — M5386 Other specified dorsopathies, lumbar region: Secondary | ICD-10-CM | POA: Diagnosis not present

## 2022-06-16 DIAGNOSIS — B952 Enterococcus as the cause of diseases classified elsewhere: Secondary | ICD-10-CM | POA: Diagnosis not present

## 2022-06-16 DIAGNOSIS — K582 Mixed irritable bowel syndrome: Secondary | ICD-10-CM | POA: Diagnosis not present

## 2022-06-16 DIAGNOSIS — D72829 Elevated white blood cell count, unspecified: Secondary | ICD-10-CM | POA: Diagnosis not present

## 2022-06-16 DIAGNOSIS — E278 Other specified disorders of adrenal gland: Secondary | ICD-10-CM | POA: Diagnosis not present

## 2022-06-16 DIAGNOSIS — D696 Thrombocytopenia, unspecified: Secondary | ICD-10-CM | POA: Diagnosis not present

## 2022-06-16 DIAGNOSIS — I700 Atherosclerosis of aorta: Secondary | ICD-10-CM | POA: Diagnosis not present

## 2022-06-16 DIAGNOSIS — M48061 Spinal stenosis, lumbar region without neurogenic claudication: Secondary | ICD-10-CM | POA: Diagnosis not present

## 2022-06-16 DIAGNOSIS — I252 Old myocardial infarction: Secondary | ICD-10-CM | POA: Diagnosis not present

## 2022-06-17 ENCOUNTER — Telehealth: Payer: Self-pay | Admitting: Cardiology

## 2022-06-17 NOTE — Telephone Encounter (Signed)
Pt c/o swelling: STAT is pt has developed SOB within 24 hours  How much weight have you gained and in what time span?  About 2 lbs in  2 days  If swelling, where is the swelling located?  Feet and hand  Are you currently taking a fluid pill?  Yes   Are you currently SOB?  No   Do you have a log of your daily weights (if so, list)?  1/30: 138 lbs 2/01: 140 lbs  Have you gained 3 pounds in a day or 5 pounds in a week?    Have you traveled recently?

## 2022-06-17 NOTE — Telephone Encounter (Signed)
Office is calling bout patient bp medication . Please advise

## 2022-06-18 DIAGNOSIS — I480 Paroxysmal atrial fibrillation: Secondary | ICD-10-CM | POA: Diagnosis not present

## 2022-06-18 DIAGNOSIS — R296 Repeated falls: Secondary | ICD-10-CM | POA: Diagnosis not present

## 2022-06-18 DIAGNOSIS — I951 Orthostatic hypotension: Secondary | ICD-10-CM | POA: Diagnosis not present

## 2022-06-18 DIAGNOSIS — Z6825 Body mass index (BMI) 25.0-25.9, adult: Secondary | ICD-10-CM | POA: Diagnosis not present

## 2022-06-18 DIAGNOSIS — I10 Essential (primary) hypertension: Secondary | ICD-10-CM | POA: Diagnosis not present

## 2022-06-18 DIAGNOSIS — S41111A Laceration without foreign body of right upper arm, initial encounter: Secondary | ICD-10-CM | POA: Diagnosis not present

## 2022-06-18 DIAGNOSIS — R5381 Other malaise: Secondary | ICD-10-CM | POA: Diagnosis not present

## 2022-06-18 DIAGNOSIS — R609 Edema, unspecified: Secondary | ICD-10-CM | POA: Diagnosis not present

## 2022-06-18 DIAGNOSIS — M1990 Unspecified osteoarthritis, unspecified site: Secondary | ICD-10-CM | POA: Diagnosis not present

## 2022-06-21 ENCOUNTER — Other Ambulatory Visit: Payer: Self-pay

## 2022-06-21 ENCOUNTER — Telehealth: Payer: Self-pay | Admitting: Cardiology

## 2022-06-21 DIAGNOSIS — Z743 Need for continuous supervision: Secondary | ICD-10-CM | POA: Diagnosis not present

## 2022-06-21 DIAGNOSIS — R9431 Abnormal electrocardiogram [ECG] [EKG]: Secondary | ICD-10-CM | POA: Diagnosis not present

## 2022-06-21 DIAGNOSIS — R0602 Shortness of breath: Secondary | ICD-10-CM | POA: Diagnosis not present

## 2022-06-21 DIAGNOSIS — I639 Cerebral infarction, unspecified: Secondary | ICD-10-CM | POA: Diagnosis not present

## 2022-06-21 DIAGNOSIS — I444 Left anterior fascicular block: Secondary | ICD-10-CM | POA: Diagnosis not present

## 2022-06-21 DIAGNOSIS — R079 Chest pain, unspecified: Secondary | ICD-10-CM | POA: Diagnosis not present

## 2022-06-21 DIAGNOSIS — R0789 Other chest pain: Secondary | ICD-10-CM | POA: Diagnosis not present

## 2022-06-21 DIAGNOSIS — R52 Pain, unspecified: Secondary | ICD-10-CM | POA: Diagnosis not present

## 2022-06-21 DIAGNOSIS — I4891 Unspecified atrial fibrillation: Secondary | ICD-10-CM | POA: Diagnosis not present

## 2022-06-21 NOTE — Telephone Encounter (Signed)
Pt c/o medication issue:  1. Name of Medication:   midodrine (PROAMATINE) 10 MG tablet    2. How are you currently taking this medication (dosage and times per day)?   Take 1 tablet (10 mg total) by mouth 3 (three) times daily.    3. Are you having a reaction (difficulty breathing--STAT)?   4. What is your medication issue? Pt's Home Health nurse would like a callback regarding medication. As well as discuss pt's BP being elevated (143/84) HR 69. Please advise

## 2022-06-21 NOTE — Telephone Encounter (Signed)
Full VM

## 2022-06-21 NOTE — Telephone Encounter (Signed)
LVM to call back.

## 2022-06-21 NOTE — Telephone Encounter (Signed)
Spoke with pt who states that she was seen at [facility] 06/20/22 for rapid heart rate and elevated BP. Pt states that her BP has been elevated. Pt states that she has not taken her medications since Friday as she was told not to take it until her home health nurse comes and gets her medication fixed. Pt states that she never started the Midodrine as it states that it causes you to have high BP and her BP is already high. I explained that since she had not had any of her medications since Friday that is why she had the problems yesterday. Home Health nurse will see her today. Please advise.

## 2022-06-22 NOTE — Telephone Encounter (Signed)
Spoke with Colletta Maryland, RN who states that sitting BP was 130/80 and standing 110/70. Pt has not been taking her Furosemide as she was told to hold it but she now has 2-3 plus edema. Please advise

## 2022-06-22 NOTE — Telephone Encounter (Signed)
Recommendations reviewed with Colletta Maryland, RN as per Dr. Joya Gaskins note.  Colletta Maryland verbalized understanding and had no additional questions.

## 2022-06-22 NOTE — Telephone Encounter (Signed)
Left vm for Colletta Maryland to callback.

## 2022-07-02 ENCOUNTER — Other Ambulatory Visit: Payer: Self-pay

## 2022-07-02 DIAGNOSIS — R296 Repeated falls: Secondary | ICD-10-CM | POA: Diagnosis not present

## 2022-07-02 DIAGNOSIS — Z6824 Body mass index (BMI) 24.0-24.9, adult: Secondary | ICD-10-CM | POA: Diagnosis not present

## 2022-07-02 DIAGNOSIS — R5381 Other malaise: Secondary | ICD-10-CM | POA: Diagnosis not present

## 2022-07-02 DIAGNOSIS — I951 Orthostatic hypotension: Secondary | ICD-10-CM | POA: Diagnosis not present

## 2022-07-02 DIAGNOSIS — I10 Essential (primary) hypertension: Secondary | ICD-10-CM | POA: Diagnosis not present

## 2022-07-02 NOTE — Telephone Encounter (Signed)
Called Heather Mccoy at [facility] and she stated that she thought that the [facility] nurse had already addressed her blood pressure question. But, if for some reason she had further questions she would reach back out to [place].

## 2022-07-20 DIAGNOSIS — S32030D Wedge compression fracture of third lumbar vertebra, subsequent encounter for fracture with routine healing: Secondary | ICD-10-CM | POA: Diagnosis not present

## 2022-07-21 DIAGNOSIS — S42221A 2-part displaced fracture of surgical neck of right humerus, initial encounter for closed fracture: Secondary | ICD-10-CM | POA: Diagnosis not present

## 2022-07-21 DIAGNOSIS — W19XXXA Unspecified fall, initial encounter: Secondary | ICD-10-CM | POA: Diagnosis not present

## 2022-07-26 DIAGNOSIS — S42221D 2-part displaced fracture of surgical neck of right humerus, subsequent encounter for fracture with routine healing: Secondary | ICD-10-CM | POA: Diagnosis not present

## 2022-07-26 DIAGNOSIS — S300XXD Contusion of lower back and pelvis, subsequent encounter: Secondary | ICD-10-CM | POA: Diagnosis not present

## 2022-07-26 DIAGNOSIS — I639 Cerebral infarction, unspecified: Secondary | ICD-10-CM | POA: Diagnosis not present

## 2022-07-26 DIAGNOSIS — Z043 Encounter for examination and observation following other accident: Secondary | ICD-10-CM | POA: Diagnosis not present

## 2022-07-27 DIAGNOSIS — Z043 Encounter for examination and observation following other accident: Secondary | ICD-10-CM | POA: Diagnosis not present

## 2022-07-27 DIAGNOSIS — I639 Cerebral infarction, unspecified: Secondary | ICD-10-CM | POA: Diagnosis not present

## 2022-07-27 DIAGNOSIS — S300XXD Contusion of lower back and pelvis, subsequent encounter: Secondary | ICD-10-CM | POA: Diagnosis not present

## 2022-07-27 DIAGNOSIS — S42221D 2-part displaced fracture of surgical neck of right humerus, subsequent encounter for fracture with routine healing: Secondary | ICD-10-CM | POA: Diagnosis not present

## 2022-08-03 DIAGNOSIS — S42017A Nondisplaced fracture of sternal end of right clavicle, initial encounter for closed fracture: Secondary | ICD-10-CM | POA: Diagnosis not present

## 2022-08-03 DIAGNOSIS — S42221A 2-part displaced fracture of surgical neck of right humerus, initial encounter for closed fracture: Secondary | ICD-10-CM | POA: Diagnosis not present

## 2022-08-12 DIAGNOSIS — R52 Pain, unspecified: Secondary | ICD-10-CM | POA: Diagnosis not present

## 2022-08-23 DIAGNOSIS — S42001D Fracture of unspecified part of right clavicle, subsequent encounter for fracture with routine healing: Secondary | ICD-10-CM | POA: Diagnosis not present

## 2022-08-23 DIAGNOSIS — I639 Cerebral infarction, unspecified: Secondary | ICD-10-CM | POA: Diagnosis not present

## 2022-08-23 DIAGNOSIS — S42221D 2-part displaced fracture of surgical neck of right humerus, subsequent encounter for fracture with routine healing: Secondary | ICD-10-CM | POA: Diagnosis not present

## 2022-08-23 DIAGNOSIS — W19XXXD Unspecified fall, subsequent encounter: Secondary | ICD-10-CM | POA: Diagnosis not present

## 2022-08-24 DIAGNOSIS — I639 Cerebral infarction, unspecified: Secondary | ICD-10-CM | POA: Diagnosis not present

## 2022-08-24 DIAGNOSIS — R296 Repeated falls: Secondary | ICD-10-CM | POA: Diagnosis not present

## 2022-08-24 DIAGNOSIS — S42221D 2-part displaced fracture of surgical neck of right humerus, subsequent encounter for fracture with routine healing: Secondary | ICD-10-CM | POA: Diagnosis not present

## 2022-08-24 DIAGNOSIS — S42001D Fracture of unspecified part of right clavicle, subsequent encounter for fracture with routine healing: Secondary | ICD-10-CM | POA: Diagnosis not present

## 2022-08-31 DIAGNOSIS — R52 Pain, unspecified: Secondary | ICD-10-CM | POA: Diagnosis not present

## 2022-08-31 DIAGNOSIS — S42221D 2-part displaced fracture of surgical neck of right humerus, subsequent encounter for fracture with routine healing: Secondary | ICD-10-CM | POA: Diagnosis not present

## 2022-09-07 DIAGNOSIS — B360 Pityriasis versicolor: Secondary | ICD-10-CM | POA: Diagnosis not present

## 2022-09-07 DIAGNOSIS — L400 Psoriasis vulgaris: Secondary | ICD-10-CM | POA: Diagnosis not present

## 2022-09-09 DIAGNOSIS — Z7189 Other specified counseling: Secondary | ICD-10-CM | POA: Diagnosis not present

## 2022-09-21 DIAGNOSIS — R197 Diarrhea, unspecified: Secondary | ICD-10-CM | POA: Diagnosis not present

## 2022-09-22 DIAGNOSIS — R197 Diarrhea, unspecified: Secondary | ICD-10-CM | POA: Diagnosis not present

## 2022-09-22 DIAGNOSIS — S42017A Nondisplaced fracture of sternal end of right clavicle, initial encounter for closed fracture: Secondary | ICD-10-CM | POA: Diagnosis not present

## 2022-09-23 DIAGNOSIS — I639 Cerebral infarction, unspecified: Secondary | ICD-10-CM | POA: Diagnosis not present

## 2022-09-23 DIAGNOSIS — R296 Repeated falls: Secondary | ICD-10-CM | POA: Diagnosis not present

## 2022-09-23 DIAGNOSIS — S42221D 2-part displaced fracture of surgical neck of right humerus, subsequent encounter for fracture with routine healing: Secondary | ICD-10-CM | POA: Diagnosis not present

## 2022-09-23 DIAGNOSIS — M7981 Nontraumatic hematoma of soft tissue: Secondary | ICD-10-CM | POA: Diagnosis not present

## 2022-09-26 DIAGNOSIS — E119 Type 2 diabetes mellitus without complications: Secondary | ICD-10-CM | POA: Diagnosis not present

## 2022-09-26 DIAGNOSIS — R1084 Generalized abdominal pain: Secondary | ICD-10-CM | POA: Diagnosis not present

## 2022-09-26 DIAGNOSIS — K7290 Hepatic failure, unspecified without coma: Secondary | ICD-10-CM | POA: Diagnosis not present

## 2022-09-26 DIAGNOSIS — I509 Heart failure, unspecified: Secondary | ICD-10-CM | POA: Diagnosis not present

## 2022-09-26 DIAGNOSIS — D539 Nutritional anemia, unspecified: Secondary | ICD-10-CM | POA: Diagnosis not present

## 2022-09-26 DIAGNOSIS — S42017D Nondisplaced fracture of sternal end of right clavicle, subsequent encounter for fracture with routine healing: Secondary | ICD-10-CM | POA: Diagnosis not present

## 2022-09-26 DIAGNOSIS — S42211D Unspecified displaced fracture of surgical neck of right humerus, subsequent encounter for fracture with routine healing: Secondary | ICD-10-CM | POA: Diagnosis not present

## 2022-09-26 DIAGNOSIS — K7460 Unspecified cirrhosis of liver: Secondary | ICD-10-CM | POA: Diagnosis not present

## 2022-09-26 DIAGNOSIS — I480 Paroxysmal atrial fibrillation: Secondary | ICD-10-CM | POA: Diagnosis not present

## 2022-09-26 DIAGNOSIS — E1165 Type 2 diabetes mellitus with hyperglycemia: Secondary | ICD-10-CM | POA: Diagnosis not present

## 2022-09-26 DIAGNOSIS — E440 Moderate protein-calorie malnutrition: Secondary | ICD-10-CM | POA: Diagnosis not present

## 2022-09-26 DIAGNOSIS — R1111 Vomiting without nausea: Secondary | ICD-10-CM | POA: Diagnosis not present

## 2022-09-26 DIAGNOSIS — I4719 Other supraventricular tachycardia: Secondary | ICD-10-CM | POA: Diagnosis not present

## 2022-09-26 DIAGNOSIS — M159 Polyosteoarthritis, unspecified: Secondary | ICD-10-CM | POA: Diagnosis not present

## 2022-09-26 DIAGNOSIS — I110 Hypertensive heart disease with heart failure: Secondary | ICD-10-CM | POA: Diagnosis not present

## 2022-09-26 DIAGNOSIS — I2510 Atherosclerotic heart disease of native coronary artery without angina pectoris: Secondary | ICD-10-CM | POA: Diagnosis not present

## 2022-09-26 DIAGNOSIS — Z743 Need for continuous supervision: Secondary | ICD-10-CM | POA: Diagnosis not present

## 2022-09-26 DIAGNOSIS — I951 Orthostatic hypotension: Secondary | ICD-10-CM | POA: Diagnosis not present

## 2022-09-26 DIAGNOSIS — R109 Unspecified abdominal pain: Secondary | ICD-10-CM | POA: Diagnosis not present

## 2022-09-26 DIAGNOSIS — R188 Other ascites: Secondary | ICD-10-CM | POA: Diagnosis not present

## 2022-09-26 DIAGNOSIS — R1031 Right lower quadrant pain: Secondary | ICD-10-CM | POA: Diagnosis not present

## 2022-09-26 DIAGNOSIS — E860 Dehydration: Secondary | ICD-10-CM | POA: Diagnosis not present

## 2022-09-27 DIAGNOSIS — E785 Hyperlipidemia, unspecified: Secondary | ICD-10-CM | POA: Diagnosis not present

## 2022-09-27 DIAGNOSIS — I10 Essential (primary) hypertension: Secondary | ICD-10-CM | POA: Diagnosis not present

## 2022-09-27 DIAGNOSIS — R112 Nausea with vomiting, unspecified: Secondary | ICD-10-CM | POA: Diagnosis not present

## 2022-09-27 DIAGNOSIS — I951 Orthostatic hypotension: Secondary | ICD-10-CM | POA: Diagnosis not present

## 2022-09-27 DIAGNOSIS — D539 Nutritional anemia, unspecified: Secondary | ICD-10-CM | POA: Diagnosis not present

## 2022-09-27 DIAGNOSIS — K7469 Other cirrhosis of liver: Secondary | ICD-10-CM | POA: Diagnosis not present

## 2022-09-27 DIAGNOSIS — Z85038 Personal history of other malignant neoplasm of large intestine: Secondary | ICD-10-CM | POA: Diagnosis not present

## 2022-09-27 DIAGNOSIS — C189 Malignant neoplasm of colon, unspecified: Secondary | ICD-10-CM | POA: Diagnosis not present

## 2022-09-27 DIAGNOSIS — I480 Paroxysmal atrial fibrillation: Secondary | ICD-10-CM | POA: Diagnosis not present

## 2022-09-27 DIAGNOSIS — K7290 Hepatic failure, unspecified without coma: Secondary | ICD-10-CM | POA: Diagnosis not present

## 2022-09-27 DIAGNOSIS — I2510 Atherosclerotic heart disease of native coronary artery without angina pectoris: Secondary | ICD-10-CM | POA: Diagnosis not present

## 2022-09-27 DIAGNOSIS — K7460 Unspecified cirrhosis of liver: Secondary | ICD-10-CM | POA: Diagnosis not present

## 2022-09-27 DIAGNOSIS — R188 Other ascites: Secondary | ICD-10-CM | POA: Diagnosis not present

## 2022-09-28 DIAGNOSIS — R188 Other ascites: Secondary | ICD-10-CM | POA: Diagnosis not present

## 2022-09-28 DIAGNOSIS — E538 Deficiency of other specified B group vitamins: Secondary | ICD-10-CM | POA: Diagnosis not present

## 2022-09-28 DIAGNOSIS — D649 Anemia, unspecified: Secondary | ICD-10-CM | POA: Diagnosis not present

## 2022-09-28 DIAGNOSIS — R112 Nausea with vomiting, unspecified: Secondary | ICD-10-CM | POA: Diagnosis not present

## 2022-09-28 DIAGNOSIS — K7290 Hepatic failure, unspecified without coma: Secondary | ICD-10-CM | POA: Diagnosis not present

## 2022-09-28 DIAGNOSIS — K529 Noninfective gastroenteritis and colitis, unspecified: Secondary | ICD-10-CM | POA: Diagnosis not present

## 2022-09-28 DIAGNOSIS — R109 Unspecified abdominal pain: Secondary | ICD-10-CM | POA: Diagnosis not present

## 2022-09-28 DIAGNOSIS — K7460 Unspecified cirrhosis of liver: Secondary | ICD-10-CM | POA: Diagnosis not present

## 2022-09-29 DIAGNOSIS — K7460 Unspecified cirrhosis of liver: Secondary | ICD-10-CM | POA: Diagnosis not present

## 2022-09-29 DIAGNOSIS — E538 Deficiency of other specified B group vitamins: Secondary | ICD-10-CM | POA: Diagnosis not present

## 2022-09-29 DIAGNOSIS — K7290 Hepatic failure, unspecified without coma: Secondary | ICD-10-CM | POA: Diagnosis not present

## 2022-09-29 DIAGNOSIS — R109 Unspecified abdominal pain: Secondary | ICD-10-CM | POA: Diagnosis not present

## 2022-09-29 DIAGNOSIS — Z980 Intestinal bypass and anastomosis status: Secondary | ICD-10-CM | POA: Diagnosis not present

## 2022-09-29 DIAGNOSIS — D649 Anemia, unspecified: Secondary | ICD-10-CM | POA: Diagnosis not present

## 2022-09-29 DIAGNOSIS — R112 Nausea with vomiting, unspecified: Secondary | ICD-10-CM | POA: Diagnosis not present

## 2022-09-29 DIAGNOSIS — K529 Noninfective gastroenteritis and colitis, unspecified: Secondary | ICD-10-CM | POA: Diagnosis not present

## 2022-09-29 DIAGNOSIS — R188 Other ascites: Secondary | ICD-10-CM | POA: Diagnosis not present

## 2022-09-29 DIAGNOSIS — Z9889 Other specified postprocedural states: Secondary | ICD-10-CM | POA: Diagnosis not present

## 2022-09-29 DIAGNOSIS — K6389 Other specified diseases of intestine: Secondary | ICD-10-CM | POA: Diagnosis not present

## 2022-09-30 DIAGNOSIS — R188 Other ascites: Secondary | ICD-10-CM | POA: Diagnosis not present

## 2022-09-30 DIAGNOSIS — K7460 Unspecified cirrhosis of liver: Secondary | ICD-10-CM | POA: Diagnosis not present

## 2022-09-30 DIAGNOSIS — R112 Nausea with vomiting, unspecified: Secondary | ICD-10-CM | POA: Diagnosis not present

## 2022-09-30 DIAGNOSIS — K7290 Hepatic failure, unspecified without coma: Secondary | ICD-10-CM | POA: Diagnosis not present

## 2022-09-30 DIAGNOSIS — D649 Anemia, unspecified: Secondary | ICD-10-CM | POA: Diagnosis not present

## 2022-09-30 DIAGNOSIS — E538 Deficiency of other specified B group vitamins: Secondary | ICD-10-CM | POA: Diagnosis not present

## 2022-10-01 DIAGNOSIS — S72115A Nondisplaced fracture of greater trochanter of left femur, initial encounter for closed fracture: Secondary | ICD-10-CM | POA: Diagnosis not present

## 2022-10-01 DIAGNOSIS — K7460 Unspecified cirrhosis of liver: Secondary | ICD-10-CM | POA: Diagnosis not present

## 2022-10-01 DIAGNOSIS — E538 Deficiency of other specified B group vitamins: Secondary | ICD-10-CM | POA: Diagnosis not present

## 2022-10-01 DIAGNOSIS — R112 Nausea with vomiting, unspecified: Secondary | ICD-10-CM | POA: Diagnosis not present

## 2022-10-01 DIAGNOSIS — D649 Anemia, unspecified: Secondary | ICD-10-CM | POA: Diagnosis not present

## 2022-10-01 DIAGNOSIS — R188 Other ascites: Secondary | ICD-10-CM | POA: Diagnosis not present

## 2022-10-02 DIAGNOSIS — I951 Orthostatic hypotension: Secondary | ICD-10-CM | POA: Diagnosis not present

## 2022-10-02 DIAGNOSIS — M159 Polyosteoarthritis, unspecified: Secondary | ICD-10-CM | POA: Diagnosis not present

## 2022-10-02 DIAGNOSIS — I509 Heart failure, unspecified: Secondary | ICD-10-CM | POA: Diagnosis not present

## 2022-10-02 DIAGNOSIS — I2510 Atherosclerotic heart disease of native coronary artery without angina pectoris: Secondary | ICD-10-CM | POA: Diagnosis not present

## 2022-10-02 DIAGNOSIS — I110 Hypertensive heart disease with heart failure: Secondary | ICD-10-CM | POA: Diagnosis not present

## 2022-10-02 DIAGNOSIS — E119 Type 2 diabetes mellitus without complications: Secondary | ICD-10-CM | POA: Diagnosis not present

## 2022-10-02 DIAGNOSIS — I480 Paroxysmal atrial fibrillation: Secondary | ICD-10-CM | POA: Diagnosis not present

## 2022-10-02 DIAGNOSIS — S42211D Unspecified displaced fracture of surgical neck of right humerus, subsequent encounter for fracture with routine healing: Secondary | ICD-10-CM | POA: Diagnosis not present

## 2022-10-02 DIAGNOSIS — S42017D Nondisplaced fracture of sternal end of right clavicle, subsequent encounter for fracture with routine healing: Secondary | ICD-10-CM | POA: Diagnosis not present

## 2022-10-04 DIAGNOSIS — I2510 Atherosclerotic heart disease of native coronary artery without angina pectoris: Secondary | ICD-10-CM | POA: Diagnosis not present

## 2022-10-04 DIAGNOSIS — S42017D Nondisplaced fracture of sternal end of right clavicle, subsequent encounter for fracture with routine healing: Secondary | ICD-10-CM | POA: Diagnosis not present

## 2022-10-04 DIAGNOSIS — S42211D Unspecified displaced fracture of surgical neck of right humerus, subsequent encounter for fracture with routine healing: Secondary | ICD-10-CM | POA: Diagnosis not present

## 2022-10-04 DIAGNOSIS — I480 Paroxysmal atrial fibrillation: Secondary | ICD-10-CM | POA: Diagnosis not present

## 2022-10-04 DIAGNOSIS — E119 Type 2 diabetes mellitus without complications: Secondary | ICD-10-CM | POA: Diagnosis not present

## 2022-10-04 DIAGNOSIS — M159 Polyosteoarthritis, unspecified: Secondary | ICD-10-CM | POA: Diagnosis not present

## 2022-10-04 DIAGNOSIS — I509 Heart failure, unspecified: Secondary | ICD-10-CM | POA: Diagnosis not present

## 2022-10-04 DIAGNOSIS — I951 Orthostatic hypotension: Secondary | ICD-10-CM | POA: Diagnosis not present

## 2022-10-04 DIAGNOSIS — I110 Hypertensive heart disease with heart failure: Secondary | ICD-10-CM | POA: Diagnosis not present

## 2022-10-07 DIAGNOSIS — I480 Paroxysmal atrial fibrillation: Secondary | ICD-10-CM | POA: Diagnosis not present

## 2022-10-07 DIAGNOSIS — S42017D Nondisplaced fracture of sternal end of right clavicle, subsequent encounter for fracture with routine healing: Secondary | ICD-10-CM | POA: Diagnosis not present

## 2022-10-07 DIAGNOSIS — M159 Polyosteoarthritis, unspecified: Secondary | ICD-10-CM | POA: Diagnosis not present

## 2022-10-07 DIAGNOSIS — I2510 Atherosclerotic heart disease of native coronary artery without angina pectoris: Secondary | ICD-10-CM | POA: Diagnosis not present

## 2022-10-07 DIAGNOSIS — I951 Orthostatic hypotension: Secondary | ICD-10-CM | POA: Diagnosis not present

## 2022-10-07 DIAGNOSIS — E119 Type 2 diabetes mellitus without complications: Secondary | ICD-10-CM | POA: Diagnosis not present

## 2022-10-07 DIAGNOSIS — I509 Heart failure, unspecified: Secondary | ICD-10-CM | POA: Diagnosis not present

## 2022-10-07 DIAGNOSIS — S42211D Unspecified displaced fracture of surgical neck of right humerus, subsequent encounter for fracture with routine healing: Secondary | ICD-10-CM | POA: Diagnosis not present

## 2022-10-07 DIAGNOSIS — I110 Hypertensive heart disease with heart failure: Secondary | ICD-10-CM | POA: Diagnosis not present

## 2022-10-09 DIAGNOSIS — I509 Heart failure, unspecified: Secondary | ICD-10-CM | POA: Diagnosis not present

## 2022-10-09 DIAGNOSIS — S42017D Nondisplaced fracture of sternal end of right clavicle, subsequent encounter for fracture with routine healing: Secondary | ICD-10-CM | POA: Diagnosis not present

## 2022-10-09 DIAGNOSIS — I2510 Atherosclerotic heart disease of native coronary artery without angina pectoris: Secondary | ICD-10-CM | POA: Diagnosis not present

## 2022-10-09 DIAGNOSIS — I951 Orthostatic hypotension: Secondary | ICD-10-CM | POA: Diagnosis not present

## 2022-10-09 DIAGNOSIS — I110 Hypertensive heart disease with heart failure: Secondary | ICD-10-CM | POA: Diagnosis not present

## 2022-10-09 DIAGNOSIS — M159 Polyosteoarthritis, unspecified: Secondary | ICD-10-CM | POA: Diagnosis not present

## 2022-10-09 DIAGNOSIS — I480 Paroxysmal atrial fibrillation: Secondary | ICD-10-CM | POA: Diagnosis not present

## 2022-10-09 DIAGNOSIS — E119 Type 2 diabetes mellitus without complications: Secondary | ICD-10-CM | POA: Diagnosis not present

## 2022-10-09 DIAGNOSIS — S42211D Unspecified displaced fracture of surgical neck of right humerus, subsequent encounter for fracture with routine healing: Secondary | ICD-10-CM | POA: Diagnosis not present

## 2022-10-12 DIAGNOSIS — R5381 Other malaise: Secondary | ICD-10-CM | POA: Diagnosis not present

## 2022-10-12 DIAGNOSIS — I951 Orthostatic hypotension: Secondary | ICD-10-CM | POA: Diagnosis not present

## 2022-10-12 DIAGNOSIS — Z91199 Patient's noncompliance with other medical treatment and regimen due to unspecified reason: Secondary | ICD-10-CM | POA: Diagnosis not present

## 2022-10-12 DIAGNOSIS — M159 Polyosteoarthritis, unspecified: Secondary | ICD-10-CM | POA: Diagnosis not present

## 2022-10-12 DIAGNOSIS — Z7409 Other reduced mobility: Secondary | ICD-10-CM | POA: Diagnosis not present

## 2022-10-12 DIAGNOSIS — I509 Heart failure, unspecified: Secondary | ICD-10-CM | POA: Diagnosis not present

## 2022-10-12 DIAGNOSIS — E119 Type 2 diabetes mellitus without complications: Secondary | ICD-10-CM | POA: Diagnosis not present

## 2022-10-12 DIAGNOSIS — Z789 Other specified health status: Secondary | ICD-10-CM | POA: Diagnosis not present

## 2022-10-12 DIAGNOSIS — I480 Paroxysmal atrial fibrillation: Secondary | ICD-10-CM | POA: Diagnosis not present

## 2022-10-12 DIAGNOSIS — F418 Other specified anxiety disorders: Secondary | ICD-10-CM | POA: Diagnosis not present

## 2022-10-12 DIAGNOSIS — S42211D Unspecified displaced fracture of surgical neck of right humerus, subsequent encounter for fracture with routine healing: Secondary | ICD-10-CM | POA: Diagnosis not present

## 2022-10-12 DIAGNOSIS — E46 Unspecified protein-calorie malnutrition: Secondary | ICD-10-CM | POA: Diagnosis not present

## 2022-10-12 DIAGNOSIS — I2510 Atherosclerotic heart disease of native coronary artery without angina pectoris: Secondary | ICD-10-CM | POA: Diagnosis not present

## 2022-10-12 DIAGNOSIS — K7469 Other cirrhosis of liver: Secondary | ICD-10-CM | POA: Diagnosis not present

## 2022-10-12 DIAGNOSIS — I110 Hypertensive heart disease with heart failure: Secondary | ICD-10-CM | POA: Diagnosis not present

## 2022-10-12 DIAGNOSIS — S42017D Nondisplaced fracture of sternal end of right clavicle, subsequent encounter for fracture with routine healing: Secondary | ICD-10-CM | POA: Diagnosis not present

## 2022-10-12 DIAGNOSIS — D649 Anemia, unspecified: Secondary | ICD-10-CM | POA: Diagnosis not present

## 2022-10-13 DIAGNOSIS — S42017D Nondisplaced fracture of sternal end of right clavicle, subsequent encounter for fracture with routine healing: Secondary | ICD-10-CM | POA: Diagnosis not present

## 2022-10-13 DIAGNOSIS — I480 Paroxysmal atrial fibrillation: Secondary | ICD-10-CM | POA: Diagnosis not present

## 2022-10-13 DIAGNOSIS — I951 Orthostatic hypotension: Secondary | ICD-10-CM | POA: Diagnosis not present

## 2022-10-13 DIAGNOSIS — I2510 Atherosclerotic heart disease of native coronary artery without angina pectoris: Secondary | ICD-10-CM | POA: Diagnosis not present

## 2022-10-13 DIAGNOSIS — S42211D Unspecified displaced fracture of surgical neck of right humerus, subsequent encounter for fracture with routine healing: Secondary | ICD-10-CM | POA: Diagnosis not present

## 2022-10-13 DIAGNOSIS — I110 Hypertensive heart disease with heart failure: Secondary | ICD-10-CM | POA: Diagnosis not present

## 2022-10-13 DIAGNOSIS — E119 Type 2 diabetes mellitus without complications: Secondary | ICD-10-CM | POA: Diagnosis not present

## 2022-10-13 DIAGNOSIS — M159 Polyosteoarthritis, unspecified: Secondary | ICD-10-CM | POA: Diagnosis not present

## 2022-10-13 DIAGNOSIS — I509 Heart failure, unspecified: Secondary | ICD-10-CM | POA: Diagnosis not present

## 2022-10-14 DIAGNOSIS — S9031XA Contusion of right foot, initial encounter: Secondary | ICD-10-CM | POA: Diagnosis not present

## 2022-10-14 DIAGNOSIS — I2510 Atherosclerotic heart disease of native coronary artery without angina pectoris: Secondary | ICD-10-CM | POA: Diagnosis not present

## 2022-10-14 DIAGNOSIS — I480 Paroxysmal atrial fibrillation: Secondary | ICD-10-CM | POA: Diagnosis not present

## 2022-10-14 DIAGNOSIS — R600 Localized edema: Secondary | ICD-10-CM | POA: Diagnosis not present

## 2022-10-14 DIAGNOSIS — S42211D Unspecified displaced fracture of surgical neck of right humerus, subsequent encounter for fracture with routine healing: Secondary | ICD-10-CM | POA: Diagnosis not present

## 2022-10-14 DIAGNOSIS — M159 Polyosteoarthritis, unspecified: Secondary | ICD-10-CM | POA: Diagnosis not present

## 2022-10-14 DIAGNOSIS — I509 Heart failure, unspecified: Secondary | ICD-10-CM | POA: Diagnosis not present

## 2022-10-14 DIAGNOSIS — E119 Type 2 diabetes mellitus without complications: Secondary | ICD-10-CM | POA: Diagnosis not present

## 2022-10-14 DIAGNOSIS — I951 Orthostatic hypotension: Secondary | ICD-10-CM | POA: Diagnosis not present

## 2022-10-14 DIAGNOSIS — S42017D Nondisplaced fracture of sternal end of right clavicle, subsequent encounter for fracture with routine healing: Secondary | ICD-10-CM | POA: Diagnosis not present

## 2022-10-14 DIAGNOSIS — I110 Hypertensive heart disease with heart failure: Secondary | ICD-10-CM | POA: Diagnosis not present

## 2022-10-19 DIAGNOSIS — S42017D Nondisplaced fracture of sternal end of right clavicle, subsequent encounter for fracture with routine healing: Secondary | ICD-10-CM | POA: Diagnosis not present

## 2022-10-19 DIAGNOSIS — E119 Type 2 diabetes mellitus without complications: Secondary | ICD-10-CM | POA: Diagnosis not present

## 2022-10-19 DIAGNOSIS — I2510 Atherosclerotic heart disease of native coronary artery without angina pectoris: Secondary | ICD-10-CM | POA: Diagnosis not present

## 2022-10-19 DIAGNOSIS — S42211D Unspecified displaced fracture of surgical neck of right humerus, subsequent encounter for fracture with routine healing: Secondary | ICD-10-CM | POA: Diagnosis not present

## 2022-10-19 DIAGNOSIS — I509 Heart failure, unspecified: Secondary | ICD-10-CM | POA: Diagnosis not present

## 2022-10-19 DIAGNOSIS — I480 Paroxysmal atrial fibrillation: Secondary | ICD-10-CM | POA: Diagnosis not present

## 2022-10-19 DIAGNOSIS — M159 Polyosteoarthritis, unspecified: Secondary | ICD-10-CM | POA: Diagnosis not present

## 2022-10-19 DIAGNOSIS — I110 Hypertensive heart disease with heart failure: Secondary | ICD-10-CM | POA: Diagnosis not present

## 2022-10-19 DIAGNOSIS — I951 Orthostatic hypotension: Secondary | ICD-10-CM | POA: Diagnosis not present

## 2022-10-21 DIAGNOSIS — S42017D Nondisplaced fracture of sternal end of right clavicle, subsequent encounter for fracture with routine healing: Secondary | ICD-10-CM | POA: Diagnosis not present

## 2022-10-21 DIAGNOSIS — E119 Type 2 diabetes mellitus without complications: Secondary | ICD-10-CM | POA: Diagnosis not present

## 2022-10-21 DIAGNOSIS — I2510 Atherosclerotic heart disease of native coronary artery without angina pectoris: Secondary | ICD-10-CM | POA: Diagnosis not present

## 2022-10-21 DIAGNOSIS — S42211D Unspecified displaced fracture of surgical neck of right humerus, subsequent encounter for fracture with routine healing: Secondary | ICD-10-CM | POA: Diagnosis not present

## 2022-10-21 DIAGNOSIS — I951 Orthostatic hypotension: Secondary | ICD-10-CM | POA: Diagnosis not present

## 2022-10-21 DIAGNOSIS — I480 Paroxysmal atrial fibrillation: Secondary | ICD-10-CM | POA: Diagnosis not present

## 2022-10-21 DIAGNOSIS — M159 Polyosteoarthritis, unspecified: Secondary | ICD-10-CM | POA: Diagnosis not present

## 2022-10-21 DIAGNOSIS — I509 Heart failure, unspecified: Secondary | ICD-10-CM | POA: Diagnosis not present

## 2022-10-21 DIAGNOSIS — I110 Hypertensive heart disease with heart failure: Secondary | ICD-10-CM | POA: Diagnosis not present

## 2022-10-25 DIAGNOSIS — I110 Hypertensive heart disease with heart failure: Secondary | ICD-10-CM | POA: Diagnosis not present

## 2022-10-25 DIAGNOSIS — I2510 Atherosclerotic heart disease of native coronary artery without angina pectoris: Secondary | ICD-10-CM | POA: Diagnosis not present

## 2022-10-25 DIAGNOSIS — I951 Orthostatic hypotension: Secondary | ICD-10-CM | POA: Diagnosis not present

## 2022-10-25 DIAGNOSIS — S42017D Nondisplaced fracture of sternal end of right clavicle, subsequent encounter for fracture with routine healing: Secondary | ICD-10-CM | POA: Diagnosis not present

## 2022-10-25 DIAGNOSIS — M159 Polyosteoarthritis, unspecified: Secondary | ICD-10-CM | POA: Diagnosis not present

## 2022-10-25 DIAGNOSIS — S42211D Unspecified displaced fracture of surgical neck of right humerus, subsequent encounter for fracture with routine healing: Secondary | ICD-10-CM | POA: Diagnosis not present

## 2022-10-25 DIAGNOSIS — I509 Heart failure, unspecified: Secondary | ICD-10-CM | POA: Diagnosis not present

## 2022-10-25 DIAGNOSIS — I480 Paroxysmal atrial fibrillation: Secondary | ICD-10-CM | POA: Diagnosis not present

## 2022-10-25 DIAGNOSIS — E119 Type 2 diabetes mellitus without complications: Secondary | ICD-10-CM | POA: Diagnosis not present

## 2022-10-26 DIAGNOSIS — S42017D Nondisplaced fracture of sternal end of right clavicle, subsequent encounter for fracture with routine healing: Secondary | ICD-10-CM | POA: Diagnosis not present

## 2022-10-26 DIAGNOSIS — M159 Polyosteoarthritis, unspecified: Secondary | ICD-10-CM | POA: Diagnosis not present

## 2022-10-26 DIAGNOSIS — I110 Hypertensive heart disease with heart failure: Secondary | ICD-10-CM | POA: Diagnosis not present

## 2022-10-26 DIAGNOSIS — I2510 Atherosclerotic heart disease of native coronary artery without angina pectoris: Secondary | ICD-10-CM | POA: Diagnosis not present

## 2022-10-26 DIAGNOSIS — E1140 Type 2 diabetes mellitus with diabetic neuropathy, unspecified: Secondary | ICD-10-CM | POA: Diagnosis not present

## 2022-10-26 DIAGNOSIS — I509 Heart failure, unspecified: Secondary | ICD-10-CM | POA: Diagnosis not present

## 2022-10-26 DIAGNOSIS — I951 Orthostatic hypotension: Secondary | ICD-10-CM | POA: Diagnosis not present

## 2022-10-26 DIAGNOSIS — S42221D 2-part displaced fracture of surgical neck of right humerus, subsequent encounter for fracture with routine healing: Secondary | ICD-10-CM | POA: Diagnosis not present

## 2022-10-26 DIAGNOSIS — I471 Supraventricular tachycardia, unspecified: Secondary | ICD-10-CM | POA: Diagnosis not present

## 2022-10-28 DIAGNOSIS — I2510 Atherosclerotic heart disease of native coronary artery without angina pectoris: Secondary | ICD-10-CM | POA: Diagnosis not present

## 2022-10-28 DIAGNOSIS — S42221D 2-part displaced fracture of surgical neck of right humerus, subsequent encounter for fracture with routine healing: Secondary | ICD-10-CM | POA: Diagnosis not present

## 2022-10-28 DIAGNOSIS — I110 Hypertensive heart disease with heart failure: Secondary | ICD-10-CM | POA: Diagnosis not present

## 2022-10-28 DIAGNOSIS — I509 Heart failure, unspecified: Secondary | ICD-10-CM | POA: Diagnosis not present

## 2022-10-28 DIAGNOSIS — I471 Supraventricular tachycardia, unspecified: Secondary | ICD-10-CM | POA: Diagnosis not present

## 2022-10-28 DIAGNOSIS — I951 Orthostatic hypotension: Secondary | ICD-10-CM | POA: Diagnosis not present

## 2022-10-28 DIAGNOSIS — E1140 Type 2 diabetes mellitus with diabetic neuropathy, unspecified: Secondary | ICD-10-CM | POA: Diagnosis not present

## 2022-10-28 DIAGNOSIS — M159 Polyosteoarthritis, unspecified: Secondary | ICD-10-CM | POA: Diagnosis not present

## 2022-10-28 DIAGNOSIS — S42017D Nondisplaced fracture of sternal end of right clavicle, subsequent encounter for fracture with routine healing: Secondary | ICD-10-CM | POA: Diagnosis not present

## 2022-10-29 DIAGNOSIS — E1140 Type 2 diabetes mellitus with diabetic neuropathy, unspecified: Secondary | ICD-10-CM | POA: Diagnosis not present

## 2022-10-29 DIAGNOSIS — S42017D Nondisplaced fracture of sternal end of right clavicle, subsequent encounter for fracture with routine healing: Secondary | ICD-10-CM | POA: Diagnosis not present

## 2022-10-29 DIAGNOSIS — I471 Supraventricular tachycardia, unspecified: Secondary | ICD-10-CM | POA: Diagnosis not present

## 2022-10-29 DIAGNOSIS — I509 Heart failure, unspecified: Secondary | ICD-10-CM | POA: Diagnosis not present

## 2022-10-29 DIAGNOSIS — I2510 Atherosclerotic heart disease of native coronary artery without angina pectoris: Secondary | ICD-10-CM | POA: Diagnosis not present

## 2022-10-29 DIAGNOSIS — I951 Orthostatic hypotension: Secondary | ICD-10-CM | POA: Diagnosis not present

## 2022-10-29 DIAGNOSIS — S42221D 2-part displaced fracture of surgical neck of right humerus, subsequent encounter for fracture with routine healing: Secondary | ICD-10-CM | POA: Diagnosis not present

## 2022-10-29 DIAGNOSIS — I110 Hypertensive heart disease with heart failure: Secondary | ICD-10-CM | POA: Diagnosis not present

## 2022-10-29 DIAGNOSIS — M159 Polyosteoarthritis, unspecified: Secondary | ICD-10-CM | POA: Diagnosis not present

## 2022-11-01 DIAGNOSIS — J9611 Chronic respiratory failure with hypoxia: Secondary | ICD-10-CM | POA: Diagnosis not present

## 2022-11-01 DIAGNOSIS — E872 Acidosis, unspecified: Secondary | ICD-10-CM | POA: Diagnosis not present

## 2022-11-01 DIAGNOSIS — A4159 Other Gram-negative sepsis: Secondary | ICD-10-CM | POA: Diagnosis not present

## 2022-11-01 DIAGNOSIS — G9341 Metabolic encephalopathy: Secondary | ICD-10-CM | POA: Diagnosis not present

## 2022-11-01 DIAGNOSIS — K7460 Unspecified cirrhosis of liver: Secondary | ICD-10-CM | POA: Diagnosis not present

## 2022-11-01 DIAGNOSIS — J9811 Atelectasis: Secondary | ICD-10-CM | POA: Diagnosis not present

## 2022-11-01 DIAGNOSIS — A419 Sepsis, unspecified organism: Secondary | ICD-10-CM | POA: Diagnosis not present

## 2022-11-01 DIAGNOSIS — I4891 Unspecified atrial fibrillation: Secondary | ICD-10-CM | POA: Diagnosis not present

## 2022-11-01 DIAGNOSIS — J159 Unspecified bacterial pneumonia: Secondary | ICD-10-CM | POA: Diagnosis not present

## 2022-11-01 DIAGNOSIS — R4182 Altered mental status, unspecified: Secondary | ICD-10-CM | POA: Diagnosis not present

## 2022-11-01 DIAGNOSIS — K760 Fatty (change of) liver, not elsewhere classified: Secondary | ICD-10-CM | POA: Diagnosis not present

## 2022-11-01 DIAGNOSIS — N390 Urinary tract infection, site not specified: Secondary | ICD-10-CM | POA: Diagnosis not present

## 2022-11-01 DIAGNOSIS — E1165 Type 2 diabetes mellitus with hyperglycemia: Secondary | ICD-10-CM | POA: Diagnosis not present

## 2022-11-01 DIAGNOSIS — J449 Chronic obstructive pulmonary disease, unspecified: Secondary | ICD-10-CM | POA: Diagnosis not present

## 2022-11-01 DIAGNOSIS — R6521 Severe sepsis with septic shock: Secondary | ICD-10-CM | POA: Diagnosis not present

## 2022-11-01 DIAGNOSIS — E7220 Disorder of urea cycle metabolism, unspecified: Secondary | ICD-10-CM | POA: Diagnosis not present

## 2022-11-01 DIAGNOSIS — N170 Acute kidney failure with tubular necrosis: Secondary | ICD-10-CM | POA: Diagnosis not present

## 2022-11-01 DIAGNOSIS — R4701 Aphasia: Secondary | ICD-10-CM | POA: Diagnosis not present

## 2022-11-01 DIAGNOSIS — J90 Pleural effusion, not elsewhere classified: Secondary | ICD-10-CM | POA: Diagnosis not present

## 2022-11-01 DIAGNOSIS — R1084 Generalized abdominal pain: Secondary | ICD-10-CM | POA: Diagnosis not present

## 2022-11-15 DEATH — deceased
